# Patient Record
Sex: Male | Born: 1980 | Race: Black or African American | Hispanic: No | Marital: Single | Smoking: Current every day smoker
Health system: Southern US, Community
[De-identification: ages and names within clinical notes are randomized; demographics above are authoritative.]

## PROBLEM LIST (undated history)

## (undated) DIAGNOSIS — I10 Essential (primary) hypertension: Secondary | ICD-10-CM

## (undated) DIAGNOSIS — E669 Obesity, unspecified: Secondary | ICD-10-CM

## (undated) DIAGNOSIS — I82409 Acute embolism and thrombosis of unspecified deep veins of unspecified lower extremity: Secondary | ICD-10-CM

## (undated) DIAGNOSIS — I639 Cerebral infarction, unspecified: Secondary | ICD-10-CM

## (undated) HISTORY — DX: Acute embolism and thrombosis of unspecified deep veins of unspecified lower extremity: I82.409

## (undated) HISTORY — PX: NO PAST SURGERIES: SHX2092

---

## 2004-05-20 ENCOUNTER — Emergency Department: Payer: Self-pay | Admitting: Emergency Medicine

## 2004-05-26 ENCOUNTER — Emergency Department: Payer: Self-pay | Admitting: Emergency Medicine

## 2004-09-08 ENCOUNTER — Emergency Department: Payer: Self-pay | Admitting: Emergency Medicine

## 2008-12-27 ENCOUNTER — Emergency Department: Payer: Self-pay | Admitting: Emergency Medicine

## 2013-07-20 ENCOUNTER — Emergency Department: Payer: Self-pay | Admitting: Emergency Medicine

## 2013-07-20 LAB — CBC
HCT: 48.3 % (ref 40.0–52.0)
HGB: 16.5 g/dL (ref 13.0–18.0)
MCH: 31.2 pg (ref 26.0–34.0)
MCHC: 34.1 g/dL (ref 32.0–36.0)
MCV: 92 fL (ref 80–100)
Platelet: 232 10*3/uL (ref 150–440)
RBC: 5.27 10*6/uL (ref 4.40–5.90)
RDW: 13.1 % (ref 11.5–14.5)
WBC: 6.2 10*3/uL (ref 3.8–10.6)

## 2013-07-20 LAB — COMPREHENSIVE METABOLIC PANEL
ALK PHOS: 78 U/L
ALT: 26 U/L (ref 12–78)
ANION GAP: 1 — AB (ref 7–16)
Albumin: 3.5 g/dL (ref 3.4–5.0)
BUN: 11 mg/dL (ref 7–18)
Bilirubin,Total: 0.6 mg/dL (ref 0.2–1.0)
CREATININE: 1.01 mg/dL (ref 0.60–1.30)
Calcium, Total: 9 mg/dL (ref 8.5–10.1)
Chloride: 105 mmol/L (ref 98–107)
Co2: 28 mmol/L (ref 21–32)
EGFR (Non-African Amer.): >60
Glucose: 108 mg/dL — ABNORMAL HIGH (ref 65–99)
Osmolality: 268 (ref 275–301)
Potassium: 3.7 mmol/L (ref 3.5–5.1)
SGOT(AST): 28 U/L (ref 15–37)
Sodium: 134 mmol/L — ABNORMAL LOW (ref 136–145)
TOTAL PROTEIN: 7.6 g/dL (ref 6.4–8.2)

## 2013-07-20 LAB — TROPONIN I: Troponin-I: <0.02 ng/mL

## 2013-07-20 LAB — CK TOTAL AND CKMB (NOT AT ARMC)
CK, Total: 158 U/L (ref 35–232)
CK-MB: 1.2 ng/mL (ref 0.5–3.6)

## 2013-10-15 ENCOUNTER — Emergency Department: Payer: Self-pay | Admitting: Emergency Medicine

## 2014-07-01 ENCOUNTER — Inpatient Hospital Stay: Payer: Self-pay | Admitting: Internal Medicine

## 2014-07-01 LAB — CBC
HCT: 45.8 % (ref 40.0–52.0)
HGB: 15.3 g/dL (ref 13.0–18.0)
MCH: 31 pg (ref 26.0–34.0)
MCHC: 33.4 g/dL (ref 32.0–36.0)
MCV: 93 fL (ref 80–100)
Platelet: 328 10*3/uL (ref 150–440)
RBC: 4.93 10*6/uL (ref 4.40–5.90)
RDW: 12.8 % (ref 11.5–14.5)
WBC: 7.8 10*3/uL (ref 3.8–10.6)

## 2014-07-01 LAB — TROPONIN I: Troponin-I: 0.02 ng/mL

## 2014-07-01 LAB — BASIC METABOLIC PANEL
ANION GAP: 5 — AB (ref 7–16)
BUN: 9 mg/dL (ref 7–18)
CALCIUM: 9.1 mg/dL (ref 8.5–10.1)
Chloride: 103 mmol/L (ref 98–107)
Co2: 30 mmol/L (ref 21–32)
Creatinine: 1.15 mg/dL (ref 0.60–1.30)
EGFR (African American): >60
EGFR (Non-African Amer.): >60
Glucose: 114 mg/dL — ABNORMAL HIGH (ref 65–99)
Osmolality: 275 (ref 275–301)
Potassium: 3.6 mmol/L (ref 3.5–5.1)
Sodium: 138 mmol/L (ref 136–145)

## 2014-07-02 LAB — CBC WITH DIFFERENTIAL/PLATELET
BASOS ABS: 0.1 10*3/uL (ref 0.0–0.1)
BASOS PCT: 0.8 %
EOS ABS: 0 10*3/uL (ref 0.0–0.7)
Eosinophil %: 0.5 %
HCT: 46.5 % (ref 40.0–52.0)
HGB: 15.3 g/dL (ref 13.0–18.0)
Lymphocyte #: 0.9 10*3/uL — ABNORMAL LOW (ref 1.0–3.6)
Lymphocyte %: 9.9 %
MCH: 30.8 pg (ref 26.0–34.0)
MCHC: 33 g/dL (ref 32.0–36.0)
MCV: 94 fL (ref 80–100)
Monocyte #: 0.1 x10 3/mm — ABNORMAL LOW (ref 0.2–1.0)
Monocyte %: 0.6 %
NEUTROS ABS: 7.6 10*3/uL — AB (ref 1.4–6.5)
Neutrophil %: 88.2 %
Platelet: 343 10*3/uL (ref 150–440)
RBC: 4.97 10*6/uL (ref 4.40–5.90)
RDW: 12.9 % (ref 11.5–14.5)
WBC: 8.6 10*3/uL (ref 3.8–10.6)

## 2014-07-02 LAB — BASIC METABOLIC PANEL
Anion Gap: 7 (ref 7–16)
BUN: 12 mg/dL (ref 7–18)
CO2: 27 mmol/L (ref 21–32)
Calcium, Total: 9.8 mg/dL (ref 8.5–10.1)
Chloride: 103 mmol/L (ref 98–107)
Creatinine: 1.2 mg/dL (ref 0.60–1.30)
EGFR (African American): >60
EGFR (Non-African Amer.): >60
Glucose: 171 mg/dL — ABNORMAL HIGH (ref 65–99)
OSMOLALITY: 278 (ref 275–301)
Potassium: 3.8 mmol/L (ref 3.5–5.1)
Sodium: 137 mmol/L (ref 136–145)

## 2014-07-02 LAB — TROPONIN I
TROPONIN-I: 0.02 ng/mL
TROPONIN-I: 0.06 ng/mL — AB

## 2014-11-04 NOTE — H&P (Signed)
PATIENT NAME:  Ricky Leonard, Ricky Leonard MR#:  [id number] DATE OF BIRTH:  09/02/1980  DATE OF ADMISSION:  07/01/2014  REFERRING PHYSICIAN: Gladstone Pih, MD  PRIMARY CARE PHYSICIAN: None.   CHIEF COMPLAINT: Shortness of breath.   HISTORY OF PRESENT ILLNESS: A 34 year old African American gentleman with past medical history of hypertension as well as seasonal allergies who presented with shortness of breath. He describes symptoms originally starting with URI-like symptoms, cough, congestion, nasal congestion, and rhinorrhea about 2 weeks ago. All those symptoms have essentially resolved; however, now has dyspnea on exertion with associated nonproductive cough and wheezing. Denies any fevers, chills, or further symptomatology. Denies any chest pain, lower extremity edema, or orthopnea. He presented to the hospital for above symptoms, found to be markedly hypertensive.   REVIEW OF SYSTEMS:  CONSTITUTIONAL: Denies fevers, chills, fatigue, weakness.  EYES: Denies blurred vision, double vision, or eye pain.  EARS, NOSE, THROAT: Denies tinnitus, ear pain, or hearing loss.  RESPIRATORY: Positive for nonproductive cough as well as wheezing.  CARDIOVASCULAR: Positive for dyspnea on exertion. Denies chest pain, orthopnea, or edema.  GASTROINTESTINAL: Denies nausea, vomiting, diarrhea, or abdominal pain.  GENITOURINARY: Denies dysuria or hematuria.  ENDOCRINE: Denies nocturia or thyroid problems.   HEMATOLOGY AND LYMPHATIC: Denies easy bruising or bleeding.  SKIN: Denies rash or lesions.  MUSCULOSKELETAL: Denies pain in neck, back, shoulder, knees, hips, or arthritic symptoms.  NEUROLOGIC: Denies paralysis, paresthesias.  PSYCHIATRIC: Denies anxiety or depressive symptoms.   Otherwise, a full review of systems performed by me is negative.  PAST MEDICAL HISTORY: Hypertension; however, on no medications currently, as well as seasonal allergies.   SOCIAL HISTORY: Positive tobacco abuse. Denies for occasional  alcohol. Denies any drug use.   FAMILY HISTORY: Denies any known cardiovascular or pulmonary disorders.   ALLERGIES: No known drug allergies.   HOME MEDICATIONS: None.   PHYSICAL EXAMINATION:  VITAL SIGNS: Temperature 98.2, heart rate of 96, respirations of 24, blood pressure 201/136, saturating 88% on room air, saturating 94% on 2 liters nasal cannula. Weight 129.3 kg. BMI 40.9.  GENERAL: Obese African American gentleman in minimal distress given respiratory status.  HEAD: Normocephalic, atraumatic.  EYES: Pupils equal, round, reactive to light. Extraocular muscles intact. No scleral icterus.  MOUTH: Moist mucosal membrane. Dentition intact. No abscess noted.  EAR, NOSE, THROAT: Clear without exudates. No external lesions.  NECK: Supple. No thyromegaly. No nodules. No JVD.  PULMONARY: Diminished breath sounds in all lung fields with scattered expiratory wheezing most prominent in the left upper lobe. No use of accessory muscles; however, tachypneic.  CHEST: Nontender to palpation.  CARDIOVASCULAR: S1, S2, tachycardic. No murmurs, rubs, or gallops. No edema. Pedal pulses 2+ bilaterally.  GASTROINTESTINAL: Soft, nontender, nondistended. No masses. Positive bowel sounds. No hepatosplenomegaly.  MUSCULOSKELETAL: No swelling, clubbing, or edema. Range of motion full in all extremities.  NEUROLOGIC: Cranial nerves II to XII intact. No gross focal neurological deficits. Sensation intact. Reflexes intact.  SKIN: No ulceration, lesions, rashes, or cyanosis. Skin warm and dry. Turgor intact.  PSYCHIATRIC: Mood and affect within normal limits. The patient is awake, alert, oriented x 3. Insight and judgment intact.   LABORATORY AND RADIOLOGICAL DATA: Sodium 138, potassium 3.6, chloride 103, bicarbonate 30, BUN 9, creatinine 1.15, glucose 114. Troponin 0.02. WBC 7.8, hemoglobin 15.3, platelets of 382,000. Chest x-ray performed which reveals no acute cardiopulmonary process.   ASSESSMENT AND PLAN: A  34 year old African American gentleman with history of hypertension presented with shortness of breath.  1.  Hypertensive urgency:  He stopped taking medications. It is unfortunately unknown of which ones he said he is taking. Regardless, give hydralazine 10 mg intravenous once now, followed by as needed 10 mg intravenous every 4 hours as needed for systolic greater than 180 or diastolic greater than 100. We will also add Norvasc 10 mg by mouth daily. He may require more agents for better control.  2.  Refractive air with wheezing and acute exacerbation: Provide DuoNeb treatments, supplemental oxygen. Keep his oxygen saturation greater than 92%, as well as steroid, Solu-Medrol  60 mg intravenous daily to be tapered off.  3.  Venous thromboembolism prophylaxis: Heparin subcutaneous.   CODE STATUS: The patient is a FULL CODE.   TIME SPENT: 45 minutes.    ____________________________ Cletis Athensavid K. Hower, MD dkh:ts D: 07/01/2014 23:01:00 ET T: 07/01/2014 23:28:01 ET JOB#: [id number]  cc: Cletis Athensavid K. Hower, MD, <Dictator> DAVID Synetta ShadowK HOWER MD ELECTRONICALLY SIGNED 07/02/2014 1:10

## 2014-11-08 NOTE — Discharge Summary (Signed)
PATIENT NAME:  Ricky Leonard, Ricky Leonard MR#:  [id number] DATE OF BIRTH:  August 20, 1980  DATE OF ADMISSION:  07/01/2014 DATE OF DISCHARGE:  07/03/2014  ADMITTING PHYSICIAN: Angelica Ranavid Hower, M.D.   DISCHARGING PHYSICIAN: Enid Baasadhika Lyndsi Altic, MD   PRIMARY CARE PHYSICIAN: None.  CONSULTATIONS IN THE HOSPITAL: None.   DISCHARGE DIAGNOSES:  1.  Hypotensive urgency.  2.  Tachycardia.  3.  Elevated troponin secondary to demand ischemia.  4.  Reactive airway disease.  5.  Tobacco use disorder.   DISCHARGE HOME MEDICATIONS:  1.  Prednisone taper over 12 days.  2.  Benazepril 20 mg p.o. daily.  3.  Clonidine 0.2 mg p.o. b.i.d.  4.  Imdur 30 mg p.o. daily.  5.  Hydralazine 50 mg p.o. t.i.d.  6.  Symbicort 160/4.5 mcg 2 puffs b.i.d.  7.  Combivent Respimat 1 puff 4 times Leonard day.   DISCHARGE DIET: Low-sodium diet.   DISCHARGE ACTIVITY: As tolerated.    FOLLOWUP INSTRUCTIONS:  1.  PCP follow-up in 1 week.  2.  Smoking cessation. 3.  Strongly advised to be compliant with medications.   LABORATORY AND IMAGING STUDIES PRIOR TO DISCHARGE: WBC 8.6, hemoglobin 15.3, hematocrit 46.5, platelet count is 343,000.   Sodium 137, potassium 3.8, chloride 103, bicarbonate 27, BUN 12, creatinine 1.2, glucose 121, and calcium of 9.8. Troponin is 0.06. Chest x-ray revealing no acute cardiopulmonary process, clear lung fields.   BRIEF HOSPITAL COURSE: Ricky Leonard is Leonard 34 year old African American male with no significant past medical history other than hypertension, not taking medications for several months, comes to the hospital secondary to difficulty breathing, tachycardia. He was noted to have hypertensive urgency.  1.  Hypertensive urgency with elevated blood pressure systolic greater than 200 persistently. Requiring multiple IV pushes of hydralazine and labetalol. Finally, the blood pressure was better controlled with oral regimen of clonidine, benazepril, hydralazine, and Imdur at this time.  So, he is being discharged  home on those medications  2.  Sinus tachycardia, secondary to anxiety and also his respiratory issues improved, could not start on any beta blocker because of his wheezing.  3.  Reactive airway disease. Denies any history of asthma or chronic obstructive pulmonary disease, but he is Leonard smoker, significant wheezing, and coughing so was started on prednisone with improvement in the hospital. He is being discharged on the same. He is also being discharged on inhalers for bronchodilation.    His course has been otherwise uneventful in the hospital.   DISCHARGE CONDITION: Stable.   DISCHARGE DISPOSITION: Home.   TIME SPENT ON DISCHARGE: 40 minutes.       ____________________________ Enid Baasadhika Nowell Sites, MD rk:DT D: 07/04/2014 16:24:19 ET T: 07/04/2014 17:07:03 ET JOB#: [id number]  cc: Enid Baasadhika Starnisha Batrez, MD, <Dictator> [facility] Enid BaasADHIKA Jules Baty MD ELECTRONICALLY SIGNED 07/18/2014 16:52

## 2016-06-13 ENCOUNTER — Emergency Department: Payer: Medicaid Other

## 2016-06-13 ENCOUNTER — Encounter: Payer: Self-pay | Admitting: Emergency Medicine

## 2016-06-13 ENCOUNTER — Inpatient Hospital Stay
Admission: EM | Admit: 2016-06-13 | Discharge: 2016-06-16 | DRG: 304 | Disposition: A | Discharge status: Home / Self Care | Payer: Medicaid Other | Attending: Internal Medicine | Admitting: Internal Medicine

## 2016-06-13 DIAGNOSIS — I161 Hypertensive emergency: Secondary | ICD-10-CM | POA: Diagnosis present

## 2016-06-13 DIAGNOSIS — J9601 Acute respiratory failure with hypoxia: Secondary | ICD-10-CM | POA: Diagnosis present

## 2016-06-13 DIAGNOSIS — R079 Chest pain, unspecified: Secondary | ICD-10-CM | POA: Diagnosis present

## 2016-06-13 DIAGNOSIS — R0902 Hypoxemia: Secondary | ICD-10-CM

## 2016-06-13 DIAGNOSIS — E876 Hypokalemia: Secondary | ICD-10-CM | POA: Diagnosis present

## 2016-06-13 DIAGNOSIS — I82C12 Acute embolism and thrombosis of left internal jugular vein: Secondary | ICD-10-CM | POA: Diagnosis present

## 2016-06-13 DIAGNOSIS — Z818 Family history of other mental and behavioral disorders: Secondary | ICD-10-CM | POA: Diagnosis not present

## 2016-06-13 DIAGNOSIS — F17210 Nicotine dependence, cigarettes, uncomplicated: Secondary | ICD-10-CM | POA: Diagnosis present

## 2016-06-13 DIAGNOSIS — I10 Essential (primary) hypertension: Secondary | ICD-10-CM | POA: Diagnosis not present

## 2016-06-13 DIAGNOSIS — R609 Edema, unspecified: Secondary | ICD-10-CM

## 2016-06-13 DIAGNOSIS — E669 Obesity, unspecified: Secondary | ICD-10-CM | POA: Diagnosis present

## 2016-06-13 DIAGNOSIS — E785 Hyperlipidemia, unspecified: Secondary | ICD-10-CM | POA: Diagnosis present

## 2016-06-13 DIAGNOSIS — Z8249 Family history of ischemic heart disease and other diseases of the circulatory system: Secondary | ICD-10-CM | POA: Diagnosis not present

## 2016-06-13 DIAGNOSIS — Z6835 Body mass index (BMI) 35.0-35.9, adult: Secondary | ICD-10-CM | POA: Diagnosis not present

## 2016-06-13 DIAGNOSIS — M7989 Other specified soft tissue disorders: Secondary | ICD-10-CM | POA: Diagnosis present

## 2016-06-13 DIAGNOSIS — I82B12 Acute embolism and thrombosis of left subclavian vein: Secondary | ICD-10-CM | POA: Diagnosis not present

## 2016-06-13 DIAGNOSIS — I248 Other forms of acute ischemic heart disease: Secondary | ICD-10-CM | POA: Diagnosis present

## 2016-06-13 DIAGNOSIS — Z825 Family history of asthma and other chronic lower respiratory diseases: Secondary | ICD-10-CM

## 2016-06-13 HISTORY — DX: Essential (primary) hypertension: I10

## 2016-06-13 LAB — BASIC METABOLIC PANEL
ANION GAP: 7 (ref 5–15)
BUN: 18 mg/dL (ref 6–20)
CO2: 26 mmol/L (ref 22–32)
Calcium: 9.6 mg/dL (ref 8.9–10.3)
Chloride: 104 mmol/L (ref 101–111)
Creatinine, Ser: 1.27 mg/dL — ABNORMAL HIGH (ref 0.61–1.24)
GFR calc Af Amer: >60 mL/min (ref >60)
GFR calc non Af Amer: >60 mL/min (ref >60)
GLUCOSE: 83 mg/dL (ref 65–99)
POTASSIUM: 3.5 mmol/L (ref 3.5–5.1)
Sodium: 137 mmol/L (ref 135–145)

## 2016-06-13 LAB — CBC
HEMATOCRIT: 44.3 % (ref 40.0–52.0)
HEMOGLOBIN: 15.2 g/dL (ref 13.0–18.0)
MCH: 32.1 pg (ref 26.0–34.0)
MCHC: 34.3 g/dL (ref 32.0–36.0)
MCV: 93.7 fL (ref 80.0–100.0)
Platelets: 197 10*3/uL (ref 150–440)
RBC: 4.73 MIL/uL (ref 4.40–5.90)
RDW: 13.4 % (ref 11.5–14.5)
WBC: 8.5 10*3/uL (ref 3.8–10.6)

## 2016-06-13 LAB — TROPONIN I: Troponin I: <0.03 ng/mL (ref <0.03)

## 2016-06-13 MED ORDER — NICARDIPINE HCL IN NACL 20-0.86 MG/200ML-% IV SOLN
3.0000 mg/h | INTRAVENOUS | Status: DC
  Administered 2016-06-14: 7.5 mg/h via INTRAVENOUS
  Administered 2016-06-14 (×2): 5 mg/h via INTRAVENOUS
  Administered 2016-06-14: 10 mg/h via INTRAVENOUS
  Administered 2016-06-14: 7.5 mg/h via INTRAVENOUS
  Administered 2016-06-14: 5 mg/h via INTRAVENOUS
  Administered 2016-06-14 (×2): 7.5 mg/h via INTRAVENOUS
  Administered 2016-06-14: 5 mg/h via INTRAVENOUS
  Administered 2016-06-15: 3 mg/h via INTRAVENOUS
  Administered 2016-06-15: 7.5 mg/h via INTRAVENOUS
  Administered 2016-06-15 (×3): 5.5 mg/h via INTRAVENOUS
  Filled 2016-06-13 (×15): qty 200

## 2016-06-13 MED ORDER — LABETALOL HCL 5 MG/ML IV SOLN
40.0000 mg | Freq: Once | INTRAVENOUS | Status: AC
  Administered 2016-06-13: 40 mg via INTRAVENOUS

## 2016-06-13 MED ORDER — LABETALOL HCL 5 MG/ML IV SOLN
INTRAVENOUS | Status: AC
Start: 2016-06-13 — End: 2016-06-14
  Filled 2016-06-13: qty 8

## 2016-06-13 MED ORDER — HYDRALAZINE HCL 20 MG/ML IJ SOLN
INTRAMUSCULAR | Status: AC
  Filled 2016-06-13: qty 1

## 2016-06-13 MED ORDER — HYDRALAZINE HCL 20 MG/ML IJ SOLN
10.0000 mg | Freq: Once | INTRAMUSCULAR | Status: AC
  Administered 2016-06-13: 10 mg via INTRAVENOUS

## 2016-06-13 MED ORDER — NICARDIPINE HCL IN NACL 20-0.86 MG/200ML-% IV SOLN
INTRAVENOUS | Status: AC
  Filled 2016-06-13: qty 200

## 2016-06-13 MED ORDER — IOPAMIDOL (ISOVUE-370) INJECTION 76%
100.0000 mL | Freq: Once | INTRAVENOUS | Status: AC | PRN
  Administered 2016-06-13: 100 mL via INTRAVENOUS

## 2016-06-13 MED ORDER — LABETALOL HCL 5 MG/ML IV SOLN
20.0000 mg | Freq: Once | INTRAVENOUS | Status: AC
  Administered 2016-06-13: 20 mg via INTRAVENOUS
  Filled 2016-06-13: qty 4

## 2016-06-13 NOTE — H&P (Signed)
[facility] [place] [facility]   PATIENT NAME: Ricky FretShaun Leonard    MR#:  [id number]  DATE OF BIRTH:  1980/11/24  DATE OF ADMISSION:  06/13/2016  PRIMARY CARE PHYSICIAN: No PCP Per Patient   REQUESTING/REFERRING PHYSICIAN: Huel CoteQuigley, MD  CHIEF COMPLAINT:   Chief Complaint  Patient presents with  . Chest Pain    HISTORY OF PRESENT ILLNESS:  Ricky Leonard  is a 35 y.o. male who presents with Chest discomfort and left arm swelling. Patient has a history of hypertension for which she has not taken any medication or solid any treatment. Tonight his blood pressure was in the 200s systolic in the ED. Workup was negative for cardiac source for his chest pain with negative pulmonary and a CTA chest that didn't show any significant abnormality except for some possible left upper extremity thrombosis. Patient states that he developed left upper extremity swelling just in the last 24 hours. He denies any recent IV placement or IV drug use. Multiple rounds of antihypertensives did not adequately control his blood pressure and hospitalists were called for admission and further treatment  PAST MEDICAL HISTORY:   Past Medical History:  Diagnosis Date  . Hypertension     PAST SURGICAL HISTORY:   Past Surgical History:  Procedure Laterality Date  . NO PAST SURGERIES      SOCIAL HISTORY:   Social History  Substance Use Topics  . Smoking status: Current Every Day Smoker    Packs/day: 0.50    Types: Cigarettes  . Smokeless tobacco: Never Used  . Alcohol use Yes    FAMILY HISTORY:   Family History  Problem Relation Age of Onset  . Hypertension Other     DRUG ALLERGIES:  No Known Allergies  MEDICATIONS AT HOME:   Prior to Admission medications   Not on File    REVIEW OF SYSTEMS:  Review of Systems  Constitutional: Negative for chills, fever, malaise/fatigue and weight loss.  HENT: Negative for ear pain, hearing loss and tinnitus.   Eyes: Negative for  blurred vision, double vision, pain and redness.  Respiratory: Negative for cough, hemoptysis and shortness of breath.   Cardiovascular: Positive for chest pain. Negative for palpitations, orthopnea and leg swelling.       Left upper extremity swelling  Gastrointestinal: Negative for abdominal pain, constipation, diarrhea, nausea and vomiting.  Genitourinary: Negative for dysuria, frequency and hematuria.  Musculoskeletal: Negative for back pain, joint pain and neck pain.  Skin:       No acne, rash, or lesions  Neurological: Negative for dizziness, tremors, focal weakness and weakness.  Endo/Heme/Allergies: Negative for polydipsia. Does not bruise/bleed easily.  Psychiatric/Behavioral: Negative for depression. The patient is not nervous/anxious and does not have insomnia.      VITAL SIGNS:   Vitals:   06/13/16 1845 06/13/16 1847 06/13/16 2100 06/13/16 2145  BP:  (!) 227/143 (!) 211/130 (!) 212/130  Pulse:  89 73   Resp:  18 (!) 22 15  Temp:  98.4 F (36.9 C)    TempSrc:  Oral    SpO2:  96% 97%   Weight: 113.4 kg (250 lb)     Height: 5\' 10"  (1.778 m)      Wt Readings from Last 3 Encounters:  06/13/16 113.4 kg (250 lb)    PHYSICAL EXAMINATION:  Physical Exam  Vitals reviewed. Constitutional: He is oriented to person, place, and time. He appears well-developed and well-nourished. No distress.  HENT:  Head: Normocephalic and atraumatic.  Mouth/Throat: Oropharynx is clear and moist.  Eyes: Conjunctivae and EOM are normal. Pupils are equal, round, and reactive to light. No scleral icterus.  Neck: Normal range of motion. Neck supple. No JVD present. No thyromegaly present.  Cardiovascular: Normal rate, regular rhythm and intact distal pulses.  Exam reveals no gallop and no friction rub.   No murmur heard. Respiratory: Effort normal and breath sounds normal. No respiratory distress. He has no wheezes. He has no rales.  GI: Soft. Bowel sounds are normal. He exhibits no distension.  There is no tenderness.  Musculoskeletal: Normal range of motion. He exhibits edema (left upper extremity).  No arthritis, no gout  Lymphadenopathy:    He has no cervical adenopathy.  Neurological: He is alert and oriented to person, place, and time. No cranial nerve deficit.  No dysarthria, no aphasia  Skin: Skin is warm and dry. No rash noted. No erythema.  Psychiatric: He has a normal mood and affect. His behavior is normal. Judgment and thought content normal.    LABORATORY PANEL:   CBC  Recent Labs Lab 06/13/16 1851  WBC 8.5  HGB 15.2  HCT 44.3  PLT 197   ------------------------------------------------------------------------------------------------------------------  Chemistries   Recent Labs Lab 06/13/16 1851  NA 137  K 3.5  CL 104  CO2 26  GLUCOSE 83  BUN 18  CREATININE 1.27*  CALCIUM 9.6   ------------------------------------------------------------------------------------------------------------------  Cardiac Enzymes  Recent Labs Lab 06/13/16 1851  TROPONINI <0.03   ------------------------------------------------------------------------------------------------------------------  RADIOLOGY:  Dg Chest 2 View  Result Date: 06/13/2016 CLINICAL DATA:  Central chest pain radiating to the jaw for 2-3 days EXAM: CHEST  2 VIEW COMPARISON:  07/01/2014 CXR FINDINGS: The heart size and mediastinal contours are within normal limits. Both lungs are clear. The visualized skeletal structures are without acute abnormality. IMPRESSION: No active cardiopulmonary disease. Electronically Signed   By: Tollie Ethavid  Kwon M.D.   On: 06/13/2016 19:28   Ct Angio Chest Aorta W And/or Wo Contrast  Result Date: 06/13/2016 CLINICAL DATA:  Chest pain radiating to jaw for 3 days. Shortness of breath, dizziness, and lightheadedness. Clinical suspicion for thoracic aortic dissection. EXAM: CT ANGIOGRAPHY CHEST WITH CONTRAST TECHNIQUE: Multidetector CT imaging of the chest was performed  using the standard protocol during bolus administration of intravenous contrast. Multiplanar CT image reconstructions and MIPs were obtained to evaluate the vascular anatomy. CONTRAST:  100 mL Isovue 370 COMPARISON:  None. FINDINGS: Cardiovascular: No evidence of thoracic aortic aneurysm or dissection. No evidence of mediastinal hematoma. No central pulmonary emboli identified. Normal heart size. No evidence of pericardial effusion. Mediastinum/Nodes: No soft tissue masses, lymphadenopathy, or mediastinal hematoma identified. Hazy density is seen in anterior mediastinum surrounding the left innominate vein, and tracking peripherally along the subclavian and axillary veins. This is nonspecific but may be due to upper extremity DVT/thrombophlebitis. Lungs/Pleura: No pulmonary mass, infiltrate, or effusion. Upper abdomen: No acute findings. Several tiny calcified gallstones noted, without evidence of cholecystitis. Musculoskeletal: No suspicious bone lesions or other significant abnormality identified. IMPRESSION: No evidence of thoracic aortic aneurysm or dissection. Hazy density in anterior mediastinum surrounding left innominate vein, and tracking peripherally along the left subclavian and axillary veins. This is nonspecific, but may be due to upper extremity DVT/thrombophlebitis. Recommend upper extremity venous Doppler ultrasound for further evaluation. No active lung disease. Cholelithiasis.  No radiographic evidence of cholecystitis. Electronically Signed   By: Myles RosenthalJohn  Stahl M.D.   On: 06/13/2016 22:16    EKG:   Orders placed or performed during the  hospital encounter of 06/13/16  . EKG 12-Lead  . EKG 12-Lead  . ED EKG within 10 minutes  . ED EKG within 10 minutes    IMPRESSION AND PLAN:  Principal Problem:   Accelerated hypertension - antihypertensives in the ED were not very successful. Nicardipine drip started and patient admitted to stepdown unit. Blood pressure goal for the first 12 hours will  be to get him systolic less than 180 and diastolic less than 100. After that the goal be to reduce his blood pressure to less than 160/100. Active Problems:   Chest pain - improved with some reduction in his blood pressure. CTA chest showed no significant abnormalities except for possible thrombosis in his left upper extremity vessels, see below.   Left arm swelling - possibly due to thrombosis, left upper extremity ultrasound ordered  All the records are reviewed and case discussed with ED provider. Management plans discussed with the patient and/or family.  DVT PROPHYLAXIS: SubQ lovenox  GI PROPHYLAXIS: None  ADMISSION STATUS: Inpatient  CODE STATUS: Full Code Status History    This patient does not have a recorded code status. Please follow your organizational policy for patients in this situation.      TOTAL TIME TAKING CARE OF THIS PATIENT: 45 minutes.    Aradhana Gin FIELDING 06/13/2016, 11:51 PM  [facility]  Office  [phone]  CC: Primary care physician; No PCP Per Patient

## 2016-06-13 NOTE — ED Provider Notes (Signed)
Time Seen: Approximately 2101  I have reviewed the triage notes  Chief Complaint: Chest Pain   History of Present Illness: Ricky Leonard is a 35 y.o. male *who presents with some substernal chest discomfort radiating up into the jaw over the last couple of days. Patient's had a history of hypertensive urgency with previous admission here 2 years ago. The patient states he's also noticed some swelling in his left upper extremity. He came in tonight because he's been living with somebody that's currently being treated for tuberculosis over the past year. He denies any productive cough or fever. Patient denies any illicit drug usage  Past Medical History:  Diagnosis Date  . Hypertension     There are no active problems to display for this patient.   History reviewed. No pertinent surgical history.  History reviewed. No pertinent surgical history.    Allergies:  Patient has no known allergies.  Family History: No family history on file.  Social History: Social History  Substance Use Topics  . Smoking status: Current Every Day Smoker    Packs/day: 0.50    Types: Cigarettes  . Smokeless tobacco: Never Used  . Alcohol use Yes     Review of Systems:   10 point review of systems was performed and was otherwise negative:  Constitutional: No fever Eyes: No visual disturbances ENT: No sore throat, ear pain Cardiac: No chest pain Respiratory: No shortness of breath, wheezing, or stridor Abdomen: No abdominal pain, no vomiting, No diarrhea Endocrine: No weight loss, No night sweats Extremities: No peripheral edema, cyanosis Skin: No rashes, easy bruising Neurologic: No focal weakness, trouble with speech or swollowing Urologic: No dysuria, Hematuria, or urinary frequency  Physical Exam:  ED Triage Vitals  Enc Vitals Group     BP 06/13/16 1847 (!) 227/143     Pulse Rate 06/13/16 1847 89     Resp 06/13/16 1847 18     Temp 06/13/16 1847 98.4 F (36.9 C)     Temp  Source 06/13/16 1847 Oral     SpO2 06/13/16 1847 96 %     Weight 06/13/16 1845 250 lb (113.4 kg)     Height 06/13/16 1845 5\' 10"  (1.778 m)     Head Circumference --      Peak Flow --      Pain Score 06/13/16 1845 7     Pain Loc --      Pain Edu? --      Excl. in GC? --     General: Awake , Alert , and Oriented times 3; GCS 15 Head: Normal cephalic , atraumatic Eyes: Pupils equal , round, reactive to light Nose/Throat: No nasal drainage, patent upper airway without erythema or exudate.  Neck: Supple, Full range of motion, No anterior adenopathy or palpable thyroid masses Lungs: Clear to ascultation without wheezes , rhonchi, or rales Heart: Regular rate, regular rhythm without murmurs , gallops , or rubs Abdomen: Soft, non tender without rebound, guarding , or rigidity; bowel sounds positive and symmetric in all 4 quadrants. No organomegaly .        Extremities: Mild swelling in the left upper extremity Neurologic: normal ambulation, Motor symmetric without deficits, sensory intact Skin: warm, dry, no rashes   Labs:   All laboratory work was reviewed including any pertinent negatives or positives listed below:  Labs Reviewed  BASIC METABOLIC PANEL - Abnormal; Notable for the following:       Result Value   Creatinine, Ser 1.27 (*)  All other components within normal limits  CBC  TROPONIN I  URINE DRUG SCREEN, QUALITATIVE ([facility] ONLY)  Patient has mild renal insufficiency  EKG: * ED ECG REPORT I, Jennye MoccasinBrian S Kellyjo Edgren, the attending physician, personally viewed and interpreted this ECG.  Date: 06/13/2016 EKG Time: 1847 Rate: 80 Rhythm: normal sinus rhythm QRS Axis: normal Intervals: normal ST/T Wave abnormalities: normal Conduction Disturbances: none Narrative Interpretation: unremarkable Left ventricular hypertrophy   Radiology: * "Dg Chest 2 View  Result Date: 06/13/2016 CLINICAL DATA:  Central chest pain radiating to the jaw for 2-3 days EXAM: CHEST  2 VIEW  COMPARISON:  07/01/2014 CXR FINDINGS: The heart size and mediastinal contours are within normal limits. Both lungs are clear. The visualized skeletal structures are without acute abnormality. IMPRESSION: No active cardiopulmonary disease. Electronically Signed   By: Tollie Ethavid  Kwon M.D.   On: 06/13/2016 19:28   Ct Angio Chest Aorta W And/or Wo Contrast  Result Date: 06/13/2016 CLINICAL DATA:  Chest pain radiating to jaw for 3 days. Shortness of breath, dizziness, and lightheadedness. Clinical suspicion for thoracic aortic dissection. EXAM: CT ANGIOGRAPHY CHEST WITH CONTRAST TECHNIQUE: Multidetector CT imaging of the chest was performed using the standard protocol during bolus administration of intravenous contrast. Multiplanar CT image reconstructions and MIPs were obtained to evaluate the vascular anatomy. CONTRAST:  100 mL Isovue 370 COMPARISON:  None. FINDINGS: Cardiovascular: No evidence of thoracic aortic aneurysm or dissection. No evidence of mediastinal hematoma. No central pulmonary emboli identified. Normal heart size. No evidence of pericardial effusion. Mediastinum/Nodes: No soft tissue masses, lymphadenopathy, or mediastinal hematoma identified. Hazy density is seen in anterior mediastinum surrounding the left innominate vein, and tracking peripherally along the subclavian and axillary veins. This is nonspecific but may be due to upper extremity DVT/thrombophlebitis. Lungs/Pleura: No pulmonary mass, infiltrate, or effusion. Upper abdomen: No acute findings. Several tiny calcified gallstones noted, without evidence of cholecystitis. Musculoskeletal: No suspicious bone lesions or other significant abnormality identified. IMPRESSION: No evidence of thoracic aortic aneurysm or dissection. Hazy density in anterior mediastinum surrounding left innominate vein, and tracking peripherally along the left subclavian and axillary veins. This is nonspecific, but may be due to upper extremity DVT/thrombophlebitis.  Recommend upper extremity venous Doppler ultrasound for further evaluation. No active lung disease. Cholelithiasis.  No radiographic evidence of cholecystitis. Electronically Signed   By: Myles RosenthalJohn  Stahl M.D.   On: 06/13/2016 22:16  "  I personally reviewed the radiologic studies    Critical Care: * CRITICAL CARE Performed by: Jennye MoccasinBrian S Damain Broadus   Total critical care time: 33 minutes  Critical care time was exclusive of separately billable procedures and treating other patients.  Critical care was necessary to treat or prevent imminent or life-threatening deterioration.  Critical care was time spent personally by me on the following activities: development of treatment plan with patient and/or surrogate as well as nursing, discussions with consultants, evaluation of patient's response to treatment, examination of patient, obtaining history from patient or surrogate, ordering and performing treatments and interventions, ordering and review of laboratory studies, ordering and review of radiographic studies, pulse oximetry and re-evaluation of patient's condition. IV blood pressure medication for hypertensive emergency    ED Course: * Patient's blood pressure was decreased slightly as chest pain is also decreased. He had some findings on physical exam and also per CAT scan evaluation of concern of a clot in his left upper extremity. Her study is currently ordered. The patient does not have any evidence of a aortic dissection at this  time. Clinical Course      Assessment: Hypertensive emergency  Final Clinical Impression:   Final diagnoses:  Hypertensive emergency without congestive heart failure     Plan:  Inpatient management            Jennye MoccasinBrian S Tharon Kitch, MD 06/13/16 2252

## 2016-06-13 NOTE — ED Triage Notes (Addendum)
Pt reports central chest pain radiating to jaw for 2-3 days. Pt reports SOB, dizziness and lightheadedness off and on. Pt also reports back pain. Pt in no apparent distress in triage. Pt reports that he is living with someone that has been treated for tuberculosis in the past year. Pt denies cough or night sweats. Denies fever.

## 2016-06-14 ENCOUNTER — Inpatient Hospital Stay: Payer: Medicaid Other

## 2016-06-14 ENCOUNTER — Encounter: Payer: Self-pay | Admitting: Radiology

## 2016-06-14 DIAGNOSIS — I82B12 Acute embolism and thrombosis of left subclavian vein: Secondary | ICD-10-CM

## 2016-06-14 DIAGNOSIS — F17210 Nicotine dependence, cigarettes, uncomplicated: Secondary | ICD-10-CM

## 2016-06-14 DIAGNOSIS — I10 Essential (primary) hypertension: Secondary | ICD-10-CM

## 2016-06-14 DIAGNOSIS — I82C12 Acute embolism and thrombosis of left internal jugular vein: Secondary | ICD-10-CM

## 2016-06-14 DIAGNOSIS — R079 Chest pain, unspecified: Secondary | ICD-10-CM

## 2016-06-14 DIAGNOSIS — R634 Abnormal weight loss: Secondary | ICD-10-CM

## 2016-06-14 DIAGNOSIS — I82B19 Acute embolism and thrombosis of unspecified subclavian vein: Secondary | ICD-10-CM

## 2016-06-14 LAB — URINE DRUG SCREEN, QUALITATIVE (ARMC ONLY)
Amphetamines, Ur Screen: NOT DETECTED
Barbiturates, Ur Screen: NOT DETECTED
Benzodiazepine, Ur Scrn: NOT DETECTED
CANNABINOID 50 NG, UR ~~LOC~~: POSITIVE — AB
Cocaine Metabolite,Ur ~~LOC~~: NOT DETECTED
MDMA (ECSTASY) UR SCREEN: NOT DETECTED
Methadone Scn, Ur: NOT DETECTED
OPIATE, UR SCREEN: NOT DETECTED
Phencyclidine (PCP) Ur S: NOT DETECTED
Tricyclic, Ur Screen: NOT DETECTED

## 2016-06-14 LAB — PROTIME-INR
INR: 1.03
Prothrombin Time: 13.5 seconds (ref 11.4–15.2)

## 2016-06-14 LAB — BASIC METABOLIC PANEL
Anion gap: 8 (ref 5–15)
BUN: 13 mg/dL (ref 6–20)
CHLORIDE: 103 mmol/L (ref 101–111)
CO2: 26 mmol/L (ref 22–32)
Calcium: 9.4 mg/dL (ref 8.9–10.3)
Creatinine, Ser: 1.14 mg/dL (ref 0.61–1.24)
GFR calc Af Amer: >60 mL/min (ref >60)
GFR calc non Af Amer: >60 mL/min (ref >60)
Glucose, Bld: 115 mg/dL — ABNORMAL HIGH (ref 65–99)
POTASSIUM: 3.4 mmol/L — AB (ref 3.5–5.1)
SODIUM: 137 mmol/L (ref 135–145)

## 2016-06-14 LAB — CBC
HEMATOCRIT: 42.4 % (ref 40.0–52.0)
Hemoglobin: 15 g/dL (ref 13.0–18.0)
MCH: 32.4 pg (ref 26.0–34.0)
MCHC: 35.4 g/dL (ref 32.0–36.0)
MCV: 91.6 fL (ref 80.0–100.0)
Platelets: 199 10*3/uL (ref 150–440)
RBC: 4.63 MIL/uL (ref 4.40–5.90)
RDW: 13.4 % (ref 11.5–14.5)
WBC: 8.6 10*3/uL (ref 3.8–10.6)

## 2016-06-14 LAB — HEPARIN LEVEL (UNFRACTIONATED): HEPARIN UNFRACTIONATED: 1.38 [IU]/mL — AB (ref 0.30–0.70)

## 2016-06-14 LAB — MRSA PCR SCREENING: MRSA by PCR: NEGATIVE

## 2016-06-14 LAB — APTT: aPTT: 38 seconds — ABNORMAL HIGH (ref 24–36)

## 2016-06-14 MED ORDER — NICOTINE 21 MG/24HR TD PT24
21.0000 mg | MEDICATED_PATCH | Freq: Every day | TRANSDERMAL | Status: DC
  Administered 2016-06-14 – 2016-06-16 (×3): 21 mg via TRANSDERMAL
  Filled 2016-06-14 (×3): qty 1

## 2016-06-14 MED ORDER — ACETAMINOPHEN 325 MG PO TABS: 650.0000 mg | ORAL_TABLET | Freq: Four times a day (QID) | ORAL | Status: DC | PRN

## 2016-06-14 MED ORDER — HYDROCHLOROTHIAZIDE 12.5 MG PO CAPS
12.5000 mg | ORAL_CAPSULE | Freq: Every day | ORAL | Status: DC
  Administered 2016-06-14: 12.5 mg via ORAL
  Filled 2016-06-14: qty 1

## 2016-06-14 MED ORDER — ENOXAPARIN SODIUM 120 MG/0.8ML ~~LOC~~ SOLN
1.0000 mg/kg | Freq: Two times a day (BID) | SUBCUTANEOUS | Status: DC
  Administered 2016-06-14: 115 mg via SUBCUTANEOUS
  Filled 2016-06-14: qty 0.8

## 2016-06-14 MED ORDER — IOPAMIDOL (ISOVUE-370) INJECTION 76%
75.0000 mL | Freq: Once | INTRAVENOUS | Status: AC | PRN
  Administered 2016-06-14: 75 mL via INTRAVENOUS

## 2016-06-14 MED ORDER — POTASSIUM CHLORIDE CRYS ER 20 MEQ PO TBCR
40.0000 meq | EXTENDED_RELEASE_TABLET | Freq: Once | ORAL | Status: AC
  Administered 2016-06-14: 40 meq via ORAL
  Filled 2016-06-14: qty 2

## 2016-06-14 MED ORDER — SODIUM CHLORIDE 0.9% FLUSH
3.0000 mL | Freq: Two times a day (BID) | INTRAVENOUS | Status: DC
  Administered 2016-06-14 – 2016-06-15 (×3): 3 mL via INTRAVENOUS

## 2016-06-14 MED ORDER — HYDROCODONE-ACETAMINOPHEN 5-325 MG PO TABS
1.0000 | ORAL_TABLET | ORAL | Status: DC | PRN
  Administered 2016-06-14: 1 via ORAL
  Administered 2016-06-14: 2 via ORAL
  Administered 2016-06-15 (×2): 1 via ORAL
  Filled 2016-06-14 (×2): qty 1
  Filled 2016-06-14: qty 2
  Filled 2016-06-14: qty 1

## 2016-06-14 MED ORDER — METOPROLOL TARTRATE 50 MG PO TABS
50.0000 mg | ORAL_TABLET | Freq: Two times a day (BID) | ORAL | Status: DC
  Administered 2016-06-14 – 2016-06-15 (×2): 50 mg via ORAL
  Filled 2016-06-14 (×2): qty 1

## 2016-06-14 MED ORDER — ONDANSETRON HCL 4 MG PO TABS: 4.0000 mg | ORAL_TABLET | Freq: Four times a day (QID) | ORAL | Status: DC | PRN

## 2016-06-14 MED ORDER — METOPROLOL TARTRATE 25 MG PO TABS
25.0000 mg | ORAL_TABLET | Freq: Two times a day (BID) | ORAL | Status: DC
  Administered 2016-06-14: 25 mg via ORAL
  Filled 2016-06-14: qty 1

## 2016-06-14 MED ORDER — ENOXAPARIN SODIUM 40 MG/0.4ML ~~LOC~~ SOLN: 40.0000 mg | SUBCUTANEOUS | Status: DC

## 2016-06-14 MED ORDER — ACETAMINOPHEN 650 MG RE SUPP: 650.0000 mg | Freq: Four times a day (QID) | RECTAL | Status: DC | PRN

## 2016-06-14 MED ORDER — HEPARIN (PORCINE) IN NACL 100-0.45 UNIT/ML-% IJ SOLN
1650.0000 [IU]/h | INTRAMUSCULAR | Status: DC
  Administered 2016-06-14 (×2): 1650 [IU]/h via INTRAVENOUS
  Filled 2016-06-14 (×2): qty 250

## 2016-06-14 MED ORDER — ONDANSETRON HCL 4 MG/2ML IJ SOLN: 4.0000 mg | Freq: Four times a day (QID) | INTRAMUSCULAR | Status: DC | PRN

## 2016-06-14 MED ORDER — HEPARIN BOLUS VIA INFUSION
5000.0000 [IU] | Freq: Once | INTRAVENOUS | Status: AC
  Administered 2016-06-14: 5000 [IU] via INTRAVENOUS
  Filled 2016-06-14: qty 5000

## 2016-06-14 NOTE — Progress Notes (Addendum)
[employer] - [place] [facility]   PATIENT NAME: Ricky Leonard    MR#:  [id number]  DATE OF BIRTH:  Jun 18, 1981  SUBJECTIVE:  CHIEF COMPLAINT:   Chief Complaint  Patient presents with  . Chest Pain   No chest pain. But blood pressure is still elevated at 180s/100s. On Cardizem drip REVIEW OF SYSTEMS:  Review of Systems  Constitutional: Negative for chills, fever and malaise/fatigue.  HENT: Negative for congestion.   Eyes: Negative for blurred vision and double vision.  Respiratory: Negative for cough, shortness of breath and stridor.   Cardiovascular: Negative for chest pain and leg swelling.  Gastrointestinal: Negative for abdominal pain, blood in stool, diarrhea, melena, nausea and vomiting.  Genitourinary: Negative for dysuria and hematuria.  Musculoskeletal: Negative for joint pain.  Skin: Negative for rash.  Neurological: Negative for dizziness, tingling, focal weakness, loss of consciousness and headaches.  Psychiatric/Behavioral: Negative for depression. The patient is not nervous/anxious.     DRUG ALLERGIES:  No Known Allergies VITALS:  Blood pressure (!) 186/106, pulse 98, temperature 99.3 F (37.4 C), temperature source Oral, resp. rate (!) 25, height 5\' 10"  (1.778 m), weight 250 lb (113.4 kg), SpO2 94 %. PHYSICAL EXAMINATION:  Physical Exam  Constitutional: He is oriented to person, place, and time and well-developed, well-nourished, and in no distress.  HENT:  Head: Normocephalic.  Mouth/Throat: Oropharynx is clear and moist.  Eyes: Conjunctivae and EOM are normal. Pupils are equal, round, and reactive to light. No scleral icterus.  Neck: Normal range of motion. Neck supple. No JVD present. No tracheal deviation present.  Cardiovascular: Normal rate, regular rhythm and normal heart sounds.  Exam reveals no gallop.   No murmur heard. Pulmonary/Chest: Effort normal and breath sounds normal. No respiratory distress. He has no wheezes. He has  no rales.  Abdominal: He exhibits no distension. There is no tenderness.  Musculoskeletal: Normal range of motion. He exhibits no edema or tenderness.  Neurological: He is alert and oriented to person, place, and time. No cranial nerve deficit.  Skin: No rash noted. No erythema.  Psychiatric: Affect and judgment normal.   LABORATORY PANEL:   CBC  Recent Labs Lab 06/14/16 0515  WBC 8.6  HGB 15.0  HCT 42.4  PLT 199   ------------------------------------------------------------------------------------------------------------------ Chemistries   Recent Labs Lab 06/14/16 0515  NA 137  K 3.4*  CL 103  CO2 26  GLUCOSE 115*  BUN 13  CREATININE 1.14  CALCIUM 9.4   RADIOLOGY:  Dg Chest 2 View  Result Date: 06/13/2016 CLINICAL DATA:  Central chest pain radiating to the jaw for 2-3 days EXAM: CHEST  2 VIEW COMPARISON:  07/01/2014 CXR FINDINGS: The heart size and mediastinal contours are within normal limits. Both lungs are clear. The visualized skeletal structures are without acute abnormality. IMPRESSION: No active cardiopulmonary disease. Electronically Signed   By: Tollie Eth M.D.   On: 06/13/2016 19:28   US Venous Img Upper Uni Left  Result Date: 06/14/2016 CLINICAL DATA:  Upper extremity swelling EXAM: Left UPPER EXTREMITY VENOUS DOPPLER ULTRASOUND TECHNIQUE: Gray-scale sonography with graded compression, as well as color Doppler and duplex ultrasound were performed to evaluate the upper extremity deep venous system from the level of the subclavian vein and including the jugular, axillary, basilic, radial, ulnar and upper cephalic vein. Spectral Doppler was utilized to evaluate flow at rest and with distal augmentation maneuvers. COMPARISON:  CT 06/13/2016 FINDINGS: Contralateral Subclavian Vein: Respiratory phasicity is normal and symmetric with the symptomatic  side. No evidence of thrombus. Normal compressibility. Internal Jugular Vein: Expanded with echogenic occlusive thrombus.  Subclavian Vein: Not compressible. Nonocclusive thrombus present within the subclavian vein. Axillary Vein: No evidence of thrombus. Normal compressibility, respiratory phasicity and response to augmentation. Cephalic Vein: No evidence of thrombus. Normal compressibility, respiratory phasicity and response to augmentation. Basilic Vein: No evidence of thrombus. Normal compressibility, respiratory phasicity and response to augmentation. Brachial Veins: No evidence of thrombus. Normal compressibility, respiratory phasicity and response to augmentation. Radial Veins: No evidence of thrombus. Normal compressibility, respiratory phasicity and response to augmentation. Ulnar Veins: No evidence of thrombus. Normal compressibility, respiratory phasicity and response to augmentation. IMPRESSION: Acute occlusive thrombus within the left internal jugular vein. Acute nonocclusive thrombus within the left subclavian vein as well. Remaining venous structures of the left upper extremity are otherwise patent. Electronically Signed   By: Jasmine PangKim  Fujinaga M.D.   On: 06/14/2016 03:14   Ct Angio Chest Aorta W And/or Wo Contrast  Result Date: 06/13/2016 CLINICAL DATA:  Chest pain radiating to jaw for 3 days. Shortness of breath, dizziness, and lightheadedness. Clinical suspicion for thoracic aortic dissection. EXAM: CT ANGIOGRAPHY CHEST WITH CONTRAST TECHNIQUE: Multidetector CT imaging of the chest was performed using the standard protocol during bolus administration of intravenous contrast. Multiplanar CT image reconstructions and MIPs were obtained to evaluate the vascular anatomy. CONTRAST:  100 mL Isovue 370 COMPARISON:  None. FINDINGS: Cardiovascular: No evidence of thoracic aortic aneurysm or dissection. No evidence of mediastinal hematoma. No central pulmonary emboli identified. Normal heart size. No evidence of pericardial effusion. Mediastinum/Nodes: No soft tissue masses, lymphadenopathy, or mediastinal hematoma identified.  Hazy density is seen in anterior mediastinum surrounding the left innominate vein, and tracking peripherally along the subclavian and axillary veins. This is nonspecific but may be due to upper extremity DVT/thrombophlebitis. Lungs/Pleura: No pulmonary mass, infiltrate, or effusion. Upper abdomen: No acute findings. Several tiny calcified gallstones noted, without evidence of cholecystitis. Musculoskeletal: No suspicious bone lesions or other significant abnormality identified. IMPRESSION: No evidence of thoracic aortic aneurysm or dissection. Hazy density in anterior mediastinum surrounding left innominate vein, and tracking peripherally along the left subclavian and axillary veins. This is nonspecific, but may be due to upper extremity DVT/thrombophlebitis. Recommend upper extremity venous Doppler ultrasound for further evaluation. No active lung disease. Cholelithiasis.  No radiographic evidence of cholecystitis. Electronically Signed   By: Myles RosenthalJohn  Stahl M.D.   On: 06/13/2016 22:16   ASSESSMENT AND PLAN:    Accelerated hypertension  Try to taper Nicardipine drip and started lopressor and HCTZ.  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 CTA chest showed no significant abnormalities except for possible thrombosis in his left upper extremity vessels.  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 Started heparin drip. Thrombosis workup per hematologist.  Transition to Eliquis and follow-up as outpatient per vascular surgeon.  Acute respiratory failure with hypoxia. The patient is hypoxic at 87% in room air just now, in addition, he complains of chest pain. I will get CTA PE protocol to rule out PE. Started oxygen [place] 6 L to keep SAT to 95%,  now on 2 L.  Hypokalemia. Give potassium supplement and a follow-up level.  Tobacco abuse. Smoking cessation was counseled for 3 minutes, nicotine patch.   All the records are reviewed and case discussed with Care  Management/Social Worker. Management plans discussed with the patient, family and they are in agreement.  CODE STATUS: Full code  TOTAL Critical TIME TAKING CARE OF  THIS PATIENT: 46 minutes.   More than 50% of the time was spent in counseling/coordination of care: YES  POSSIBLE D/C IN 2 DAYS, DEPENDING ON CLINICAL CONDITION.   Shaune Pollackhen, Hester Forget M.D on 06/14/2016 at 4:04 PM  Between 7am to 6pm - Pager - [phone]  After 6pm go to [email or web address] - Scientist, research ([email or web address])password [email or web address]  [employer] [facility]  Hospitalists  Office  [phone]  CC: Primary care physician; No PCP Per Patient  Note: This dictation was prepared with Dragon dictation along with smaller phrase technology. Any transcriptional errors that result from this process are unintentional.

## 2016-06-14 NOTE — Progress Notes (Signed)
Called Dr. Imogene Burnhen to report that pt was having chest pain and oxygen sats dropped to 88 to 89 on RA. Had to turn on O2, but could not get sats above 90 until pt was on 6L. Now down to 2L at 94-95%. MD asked about BP. Still elevated and Cardene was turned up to 7.5 due to systolic 190s and 180s. Down to 160s now. MD increased metoprolol and added HCTZ, with one dose now. Will continue to monitor.

## 2016-06-14 NOTE — Consult Note (Signed)
[facility] Vascular Consult Note  MRN : [id number]  Ricky Leonard is a 35 y.o. (February 14, 1981) male who presents with chief complaint of  Chief Complaint  Patient presents with  . Chest Pain   History of Present Illness:   Ricky Leonard is a 35 year old male who presented to [facility] ED with a chief complaint of  substernal chest discomfort radiating up into the jaw over the last couple of days. Patient's had a history of hypertensive urgency with previous admission here 2 years ago. The patient also endorsed a history of some swelling in his left upper extremity progressing over the last couple of days. Patient denies any fever, nausea or vomiting. Denies any SOB. Patent denies any blood draws, IVs, central lines or intravenous drug use in either extremity.   Cardiac workup negative. Vascular ultrasound on 06/14/16 notable for acute occlusive thrombus within the left internal jugular vein, acute nonocclusive thrombus within the left subclavian vein as well. Remaining venous structures of the left upper extremity are otherwise patent.  Patient reports improvement in his symptoms today. Currently on heparin gtt.   Vascular surgery consulted by Dr. Imogene Burnhen for further recommendations.   Current Facility-Administered Medications  Medication Dose Route Frequency Provider Last Rate Last Dose  . acetaminophen (TYLENOL) tablet 650 mg  650 mg Oral Q6H PRN Oralia Manisavid Willis, MD       Or  . acetaminophen (TYLENOL) suppository 650 mg  650 mg Rectal Q6H PRN Oralia Manisavid Willis, MD      . heparin ADULT infusion 100 units/mL (25000 units/25150mL sodium chloride 0.45%)  1,650 Units/hr Intravenous Continuous Shaune PollackQing Chen, MD 16.5 mL/hr at 06/14/16 0856 1,650 Units/hr at 06/14/16 0856  . HYDROcodone-acetaminophen (NORCO/VICODIN) 5-325 MG per tablet 1-2 tablet  1-2 tablet Oral Q4H PRN Oralia Manisavid Willis, MD      . metoprolol tartrate (LOPRESSOR) tablet 25 mg  25 mg Oral BID Shaune PollackQing Chen, MD   25 mg at 06/14/16 0806   . nicardipine (CARDENE) 20mg  in 0.86% saline 200ml IV infusion (0.1 mg/ml)  3-15 mg/hr Intravenous Continuous Oralia Manisavid Willis, MD 50 mL/hr at 06/14/16 1204 5 mg/hr at 06/14/16 1204  . ondansetron (ZOFRAN) tablet 4 mg  4 mg Oral Q6H PRN Oralia Manisavid Willis, MD       Or  . ondansetron [place](ZOFRAN) injection 4 mg  4 mg Intravenous Q6H PRN Oralia Manisavid Willis, MD      . sodium chloride flush (NS) 0.9 % injection 3 mL  3 mL Intravenous Q12H Oralia Manisavid Willis, MD   3 mL at 06/14/16 0200   Past Medical History:  Diagnosis Date  . Hypertension    Past Surgical History:  Procedure Laterality Date  . NO PAST SURGERIES     Social History Social History  Substance Use Topics  . Smoking status: Current Every Day Smoker    Packs/day: 0.50    Types: Cigarettes  . Smokeless tobacco: Never Used  . Alcohol use Yes   Family History Family History  Problem Relation Age of Onset  . Hypertension Other   Denies family history of bleeding / clotting disorders, PAD or renal disease.   No Known Allergies  REVIEW OF SYSTEMS (Negative unless checked)  Constitutional: [] Weight loss  [] Fever  [] Chills Cardiac: [x] Chest pain   [x] Chest pressure   [x] Palpitations   [] Shortness of breath when laying flat   [] Shortness of breath at rest   [x] Shortness of breath with exertion. Vascular:  [] Pain in legs with walking   [] Pain in  legs at rest   [] Pain in legs when laying flat   [] Claudication   [] Pain in feet when walking  [] Pain in feet at rest  [] Pain in feet when laying flat   [] History of DVT   [] Phlebitis   [] Swelling in legs   [] Varicose veins   [] Non-healing ulcers Pulmonary:   [] Uses home oxygen   [] Productive cough   [] Hemoptysis   [] Wheeze  [] COPD   [] Asthma Neurologic:  [] Dizziness  [] Blackouts   [] Seizures   [] History of stroke   [] History of TIA  [] Aphasia   [] Temporary blindness   [] Dysphagia   [] Weakness or numbness in arms   [] Weakness or numbness in legs Musculoskeletal:  [] Arthritis   [] Joint swelling   [] Joint pain    [] Low back pain Hematologic:  [] Easy bruising  [] Easy bleeding   [] Hypercoagulable state   [] Anemic  [] Hepatitis Gastrointestinal:  [] Blood in stool   [] Vomiting blood  [] Gastroesophageal reflux/heartburn   [] Difficulty swallowing. Genitourinary:  [] Chronic kidney disease   [] Difficult urination  [] Frequent urination  [] Burning with urination   [] Blood in urine Skin:  [] Rashes   [] Ulcers   [] Wounds Psychological:  [] History of anxiety   []  History of major depression.  Physical Examination  Vitals:   06/14/16 0800 06/14/16 0900 06/14/16 1000 06/14/16 1100  BP: (!) 168/96 (!) 167/99 (!) 166/96 (!) 175/101  Pulse: (!) 103 92 94 97  Resp: 19 19 (!) 25 (!) 21  Temp:      TempSrc:      SpO2: 91% 91% 91% 92%  Weight:      Height:       Body mass index is 35.87 kg/m. Gen:  WD/WN, NAD Head: [place]/AT, No temporalis wasting. Prominent temp pulse not noted. Ear/Nose/Throat: Hearing grossly intact, nares w/o erythema or drainage, oropharynx w/o Erythema/Exudate Eyes: Sclera non-icteric, conjunctiva clear Neck: Trachea midline.  No JVD.  Pulmonary:  Good air movement, respirations not labored, equal bilaterally.  Cardiac: RRR, normal S1, S2. Vascular:  Vessel Right Left  Radial Palpable Palpable  Ulnar Palpable Palpable  Brachial Palpable Palpable  Carotid Palpable, without bruit Palpable, without bruit  Aorta Not palpable N/A  Femoral Palpable Palpable  Popliteal Palpable Palpable  PT Palpable Palpable  DP Palpable Palpable   Left Upper Extremity: No acute vascular compromise noted. Mild-Moderate Edema noted in extremity. No facial edema noted.   Gastrointestinal: soft, non-tender/non-distended. No guarding/reflex.  Musculoskeletal: M/S 5/5 throughout.  Extremities without ischemic changes.  No deformity or atrophy. No edema. Neurologic: Sensation grossly intact in extremities.  Symmetrical.  Speech is fluent. Motor exam as listed above. Psychiatric: Judgment intact, Mood & affect  appropriate for pt's clinical situation. Dermatologic: No rashes or ulcers noted.  No cellulitis or open wounds. Lymph : No Cervical, Axillary, or Inguinal lymphadenopathy.  CBC Lab Results  Component Value Date   WBC 8.6 06/14/2016   HGB 15.0 06/14/2016   HCT 42.4 06/14/2016   MCV 91.6 06/14/2016   PLT 199 06/14/2016   BMET    Component Value Date/Time   NA 137 06/14/2016 0515   NA 137 07/02/2014 0446   K 3.4 (L) 06/14/2016 0515   K 3.8 07/02/2014 0446   CL 103 06/14/2016 0515   CL 103 07/02/2014 0446   CO2 26 06/14/2016 0515   CO2 27 07/02/2014 0446   GLUCOSE 115 (H) 06/14/2016 0515   GLUCOSE 171 (H) 07/02/2014 0446   BUN 13 06/14/2016 0515   BUN 12 07/02/2014 0446   CREATININE 1.14 06/14/2016  0515   CREATININE 1.20 07/02/2014 0446   CALCIUM 9.4 06/14/2016 0515   CALCIUM 9.8 07/02/2014 0446   GFRNONAA >60 06/14/2016 0515   GFRNONAA >60 07/02/2014 0446   GFRNONAA >60 07/20/2013 1444   GFRAA >60 06/14/2016 0515   GFRAA >60 07/02/2014 0446   GFRAA >60 07/20/2013 1444   Estimated Creatinine Clearance: 113 mL/min (by C-G formula based on SCr of 1.14 mg/dL).  COAG Lab Results  Component Value Date   INR 1.03 06/14/2016   Radiology Dg Chest 2 View  Result Date: 06/13/2016 CLINICAL DATA:  Central chest pain radiating to the jaw for 2-3 days EXAM: CHEST  2 VIEW COMPARISON:  07/01/2014 CXR FINDINGS: The heart size and mediastinal contours are within normal limits. Both lungs are clear. The visualized skeletal structures are without acute abnormality. IMPRESSION: No active cardiopulmonary disease. Electronically Signed   By: Tollie Eth M.D.   On: 06/13/2016 19:28   US Venous Img Upper Uni Left  Result Date: 06/14/2016 CLINICAL DATA:  Upper extremity swelling EXAM: Left UPPER EXTREMITY VENOUS DOPPLER ULTRASOUND TECHNIQUE: Gray-scale sonography with graded compression, as well as color Doppler and duplex ultrasound were performed to evaluate the upper extremity deep venous  system from the level of the subclavian vein and including the jugular, axillary, basilic, radial, ulnar and upper cephalic vein. Spectral Doppler was utilized to evaluate flow at rest and with distal augmentation maneuvers. COMPARISON:  CT 06/13/2016 FINDINGS: Contralateral Subclavian Vein: Respiratory phasicity is normal and symmetric with the symptomatic side. No evidence of thrombus. Normal compressibility. Internal Jugular Vein: Expanded with echogenic occlusive thrombus. Subclavian Vein: Not compressible. Nonocclusive thrombus present within the subclavian vein. Axillary Vein: No evidence of thrombus. Normal compressibility, respiratory phasicity and response to augmentation. Cephalic Vein: No evidence of thrombus. Normal compressibility, respiratory phasicity and response to augmentation. Basilic Vein: No evidence of thrombus. Normal compressibility, respiratory phasicity and response to augmentation. Brachial Veins: No evidence of thrombus. Normal compressibility, respiratory phasicity and response to augmentation. Radial Veins: No evidence of thrombus. Normal compressibility, respiratory phasicity and response to augmentation. Ulnar Veins: No evidence of thrombus. Normal compressibility, respiratory phasicity and response to augmentation. IMPRESSION: Acute occlusive thrombus within the left internal jugular vein. Acute nonocclusive thrombus within the left subclavian vein as well. Remaining venous structures of the left upper extremity are otherwise patent. Electronically Signed   By: Jasmine Pang M.D.   On: 06/14/2016 03:14   Ct Angio Chest Aorta W And/or Wo Contrast  Result Date: 06/13/2016 CLINICAL DATA:  Chest pain radiating to jaw for 3 days. Shortness of breath, dizziness, and lightheadedness. Clinical suspicion for thoracic aortic dissection. EXAM: CT ANGIOGRAPHY CHEST WITH CONTRAST TECHNIQUE: Multidetector CT imaging of the chest was performed using the standard protocol during bolus  administration of intravenous contrast. Multiplanar CT image reconstructions and MIPs were obtained to evaluate the vascular anatomy. CONTRAST:  100 mL Isovue 370 COMPARISON:  None. FINDINGS: Cardiovascular: No evidence of thoracic aortic aneurysm or dissection. No evidence of mediastinal hematoma. No central pulmonary emboli identified. Normal heart size. No evidence of pericardial effusion. Mediastinum/Nodes: No soft tissue masses, lymphadenopathy, or mediastinal hematoma identified. Hazy density is seen in anterior mediastinum surrounding the left innominate vein, and tracking peripherally along the subclavian and axillary veins. This is nonspecific but may be due to upper extremity DVT/thrombophlebitis. Lungs/Pleura: No pulmonary mass, infiltrate, or effusion. Upper abdomen: No acute findings. Several tiny calcified gallstones noted, without evidence of cholecystitis. Musculoskeletal: No suspicious bone lesions or other significant abnormality identified.  IMPRESSION: No evidence of thoracic aortic aneurysm or dissection. Hazy density in anterior mediastinum surrounding left innominate vein, and tracking peripherally along the left subclavian and axillary veins. This is nonspecific, but may be due to upper extremity DVT/thrombophlebitis. Recommend upper extremity venous Doppler ultrasound for further evaluation. No active lung disease. Cholelithiasis.  No radiographic evidence of cholecystitis. Electronically Signed   By: Myles RosenthalJohn  Stahl M.D.   On: 06/13/2016 22:16   Assessment/Plan 35 year old male admitted for hypertensive crisis found to have  acute occlusive thrombus within the left internal jugular vein, acute nonocclusive thrombus within the left subclavian vein - Stable  1. 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 No vascular or neurological compromise. Agree with heparin gtt with transition to oral anticoagulation, we prefer Eliquis (5mg  PO BID). Patient to follow up  in our office as out patient for continued care.  2. Accelerated Hypertension: Agree with current plan. Patient will need appropriate out patient cardiology / PCP follow up.  3. Tobacco Abuse. Current every day cigarette smoker. 1/2 a pack a day. Spent 15 minutes discussing the absolute need to cessation. Discussed methods for quitting. Discussed detrimental effects on body especially vascular system. Patient will thin about it. Will prescribe nicotine patch while in house.   Discussed with Dr. Weldon Inchesew  Pancho Rushing A Adriana Lina, PA-C  06/14/2016 1:26 PM

## 2016-06-14 NOTE — Progress Notes (Signed)
ANTICOAGULATION CONSULT NOTE - Initial Consult  Pharmacy Consult for Heparin drip Indication: VTE treatment  No Known Allergies  Patient Measurements: Height: 5\' 10"  (177.8 cm) Weight: 250 lb (113.4 kg) IBW/kg (Calculated) : 73 Heparin Dosing Weight: 98 kg  Vital Signs: Temp: 99.4 F (37.4 C) (12/02 0730) Temp Source: Oral (12/02 0730) BP: 175/101 (12/02 1100) Pulse Rate: 97 (12/02 1100)  Labs:  Recent Labs  06/13/16 1851 06/14/16 0515 06/14/16 0832  HGB 15.2 15.0  --   HCT 44.3 42.4  --   PLT 197 199  --   APTT  --   --  38*  LABPROT  --   --  13.5  INR  --   --  1.03  CREATININE 1.27* 1.14  --   TROPONINI <0.03  --   --     Estimated Creatinine Clearance: 113 mL/min (by C-G formula based on SCr of 1.14 mg/dL).   Medical History: Past Medical History:  Diagnosis Date  . Hypertension     Medications:  Scheduled:  . metoprolol tartrate  25 mg Oral BID  . sodium chloride flush  3 mL Intravenous Q12H   Infusions:  . heparin 1,650 Units/hr (06/14/16 0856)  . niCARDipine 5 mg/hr (06/14/16 1204)    Assessment: 35 yo male present to ED with chest pain. Pharmacy consulted to dose and monitor heparin drip for possible left upper extremity thrombosis.  Goal of Therapy:  Heparin level 0.3-0.7 units/ml Monitor platelets by anticoagulation protocol: Yes   Plan:  Give 5000 units bolus x 1 Start heparin infusion at 1650 units/hr Check anti-Xa level in 6 hours and daily while on heparin Continue to monitor H&H and platelets  HL ordered for 12/2 at 15:00.  Stormy CardKatsoudas,Pietra Zuluaga K, RPh Clinical Pharmacist 06/14/2016,1:23 PM

## 2016-06-14 NOTE — Consult Note (Signed)
[facility]  Date of admission:  06/13/2016  Inpatient day:  06/14/2016  Consulting physician:  Dr. Demetrios Loll   Reason for Consultation:  Acute thrombus within the left internal jugular vein and acute nonocclusive thrombus within the left subclavian vein.  Chief Complaint: Ricky Leonard is a 35 y.o. male with a history of hypertension who is admitted through the emergency room with accelerated hypertension and left internal jugular vein and subclavian vein thrombosis  HPI:  The patient denies any past medical history except for hypertension. He has never had a thrombosis. He denies any left upper extremity IVs.  He denies drug use.  He denies any family history of clot. He notes left upper extremity swelling beginning late yesterday afternoon.  He states that he was moving totes filled with clothes for about an hour prior to the onset of swelling.  He denied any pain.  He presented to the emergency room with chest, bilateral shoulder, and back pain.   He described some shortness of breath.  He was hypertensive with a systolic blood pressure in the 200s. He underwent chest CT angiogram.  Imaging revealed no evidence of thoracic aortic aneurysm or dissection.  There was a hazy density in anterior mediastinum surrounding left innominate vein, and tracking peripherally along the left subclavian and axillary veins.   Left upper extremity duplex on 06/14/2016 revealed an acute occlusive thrombus within the left internal jugular vein.  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 Remaining venous structures of the left upper extremity were otherwise patent.  He was admitted to the ICU.  He was started on heparin.  He denies any chest pain.  Symptomatically, he denies any concerns.  He has lost 10 pounds in the past month secondary to trouble getting food.   Past Medical History:  Diagnosis Date  . Hypertension     Past Surgical History:  Procedure  Laterality Date  . NO PAST SURGERIES      Family History  Problem Relation Age of Onset  . Hypertension Other     Social History:  reports that he has been smoking Cigarettes.  He has been smoking about 0.50 packs per day. He has never used smokeless tobacco. He reports that he drinks alcohol. He reports that he does not use drugs.  He has smoked half a pack a day for the past 9 years.  He has used marijuana. He denies any alcohol use.  He is unemployed.  He is alone today.  Allergies: No Known Allergies  No prescriptions prior to admission.    Review of Systems: GENERAL:  Feels "ok".  No fevers or sweats.  Weight loss of 10 pounds in past month. PERFORMANCE STATUS (ECOG):  1 HEENT:  No visual changes, runny nose, sore throat, mouth sores or tenderness. Lungs: Shortness of breath, resolved.  No cough.  No hemoptysis. Cardiac:  Chest pain, resolved.  No palpitations, orthopnea, or PND. GI:  No nausea, vomiting, diarrhea, constipation, melena or hematochezia. GU:  No urgency, frequency, dysuria, or hematuria. Musculoskeletal:  Bilateral shoulder and back pain.  No joint pain.  No muscle tenderness. Extremities:  Left upper extremity edema.  No pain. Skin:  No rashes or skin changes. Neuro:  No headache, numbness or weakness, balance or coordination issues. Endocrine:  No diabetes, thyroid issues, hot flashes or night sweats. Psych:  No mood changes, depression or anxiety. Pain:  No focal pain. Review of systems:  All other systems reviewed and found  to be negative.  Physical Exam:  Blood pressure (!) 141/113, pulse (!) 102, temperature 99.4 F (37.4 C), temperature source Oral, resp. rate (!) 23, height 5' 10" (1.778 m), weight 250 lb (113.4 kg), SpO2 91 %.  GENERAL:  Well developed, well nourished, gentleman sitting comfortably in the medical ICU in no acute distress. MENTAL STATUS:  Alert and oriented to person, place and time. HEAD:  Brown hair with beard.  Normocephalic,  atraumatic, face symmetric, no Cushingoid features. EYES:  Brown eyes.  Pupils equal round and reactive to light and accomodation.  No conjunctivitis or scleral icterus. ENT:  Oropharynx clear without lesion.  Tongue normal. Mucous membranes moist.  RESPIRATORY:  Clear to auscultation without rales, wheezes or rhonchi. CARDIOVASCULAR:  Regular rate and rhythm without murmur, rub or gallop. CHEST WALL:  No dilated veins. ABDOMEN:  Soft, non-tender, with active bowel sounds, and no appreciable hepatosplenomegaly.  No masses. SKIN:  No rashes, ulcers or lesions. EXTREMITIES: No edema, no skin discoloration or tenderness.  No palpable cords. LYMPH NODES: No palpable cervical, supraclavicular, axillary or inguinal adenopathy  NEUROLOGICAL: Unremarkable. PSYCH:  Appropriate.   Results for orders placed or performed during the hospital encounter of 06/13/16 (from the past 48 hour(s))  Basic metabolic panel     Status: Abnormal   Collection Time: 06/13/16  6:51 PM  Result Value Ref Range   Sodium 137 135 - 145 mmol/L   Potassium 3.5 3.5 - 5.1 mmol/L   Chloride 104 101 - 111 mmol/L   CO2 26 22 - 32 mmol/L   Glucose, Bld 83 65 - 99 mg/dL   BUN 18 6 - 20 mg/dL   Creatinine, Ser 1.27 (H) 0.61 - 1.24 mg/dL   Calcium 9.6 8.9 - 10.3 mg/dL   GFR calc non Af Amer >60 >60 mL/min   GFR calc Af Amer >60 >60 mL/min    Comment: (NOTE) The eGFR has been calculated using the CKD EPI equation. This calculation has not been validated in all clinical situations. eGFR's persistently <60 mL/min signify possible Chronic Kidney Disease.    Anion gap 7 5 - 15  CBC     Status: None   Collection Time: 06/13/16  6:51 PM  Result Value Ref Range   WBC 8.5 3.8 - 10.6 K/uL   RBC 4.73 4.40 - 5.90 MIL/uL   Hemoglobin 15.2 13.0 - 18.0 g/dL   HCT 44.3 40.0 - 52.0 %   MCV 93.7 80.0 - 100.0 fL   MCH 32.1 26.0 - 34.0 pg   MCHC 34.3 32.0 - 36.0 g/dL   RDW 13.4 11.5 - 14.5 %   Platelets 197 150 - 440 K/uL  Troponin I      Status: None   Collection Time: 06/13/16  6:51 PM  Result Value Ref Range   Troponin I <0.03 <0.03 ng/mL  Urine Drug Screen, Qualitative ([facility] only)     Status: Abnormal   Collection Time: 06/14/16 12:12 AM  Result Value Ref Range   Tricyclic, Ur Screen NONE DETECTED NONE DETECTED   Amphetamines, Ur Screen NONE DETECTED NONE DETECTED   MDMA (Ecstasy)Ur Screen NONE DETECTED NONE DETECTED   Cocaine Metabolite,Ur [place] NONE DETECTED NONE DETECTED   Opiate, Ur Screen NONE DETECTED NONE DETECTED   Phencyclidine (PCP) Ur S NONE DETECTED NONE DETECTED   Cannabinoid 50 Ng, Ur [place] POSITIVE (A) NONE DETECTED   Barbiturates, Ur Screen NONE DETECTED NONE DETECTED   Benzodiazepine, Ur Scrn NONE DETECTED NONE DETECTED   Methadone Scn,  Ur NONE DETECTED NONE DETECTED    Comment: (NOTE) 967  Tricyclics, urine               Cutoff 1000 ng/mL 200  Amphetamines, urine             Cutoff 1000 ng/mL 300  MDMA (Ecstasy), urine           Cutoff 500 ng/mL 400  Cocaine Metabolite, urine       Cutoff 300 ng/mL 500  Opiate, urine                   Cutoff 300 ng/mL 600  Phencyclidine (PCP), urine      Cutoff 25 ng/mL 700  Cannabinoid, urine              Cutoff 50 ng/mL 800  Barbiturates, urine             Cutoff 200 ng/mL 900  Benzodiazepine, urine           Cutoff 200 ng/mL 1000 Methadone, urine                Cutoff 300 ng/mL 1100 1200 The urine drug screen provides only a preliminary, unconfirmed 1300 analytical test result and should not be used for non-medical 1400 purposes. Clinical consideration and professional judgment should 1500 be applied to any positive drug screen result due to possible 1600 interfering substances. A more specific alternate chemical method 1700 must be used in order to obtain a confirmed analytical result.  1800 Gas chromato graphy / mass spectrometry (GC/MS) is the preferred 1900 confirmatory method.   MRSA PCR Screening     Status: None   Collection Time: 06/14/16  1:58 AM   Result Value Ref Range   MRSA by PCR NEGATIVE NEGATIVE    Comment:        The GeneXpert MRSA Assay (FDA approved for NASAL specimens only), is one component of a comprehensive MRSA colonization surveillance program. It is not intended to diagnose MRSA infection nor to guide or monitor treatment for MRSA infections.   Basic metabolic panel     Status: Abnormal   Collection Time: 06/14/16  5:15 AM  Result Value Ref Range   Sodium 137 135 - 145 mmol/L   Potassium 3.4 (L) 3.5 - 5.1 mmol/L   Chloride 103 101 - 111 mmol/L   CO2 26 22 - 32 mmol/L   Glucose, Bld 115 (H) 65 - 99 mg/dL   BUN 13 6 - 20 mg/dL   Creatinine, Ser 1.14 0.61 - 1.24 mg/dL   Calcium 9.4 8.9 - 10.3 mg/dL   GFR calc non Af Amer >60 >60 mL/min   GFR calc Af Amer >60 >60 mL/min    Comment: (NOTE) The eGFR has been calculated using the CKD EPI equation. This calculation has not been validated in all clinical situations. eGFR's persistently <60 mL/min signify possible Chronic Kidney Disease.    Anion gap 8 5 - 15  CBC     Status: None   Collection Time: 06/14/16  5:15 AM  Result Value Ref Range   WBC 8.6 3.8 - 10.6 K/uL   RBC 4.63 4.40 - 5.90 MIL/uL   Hemoglobin 15.0 13.0 - 18.0 g/dL   HCT 42.4 40.0 - 52.0 %   MCV 91.6 80.0 - 100.0 fL   MCH 32.4 26.0 - 34.0 pg   MCHC 35.4 32.0 - 36.0 g/dL   RDW 13.4 11.5 - 14.5 %   Platelets 199 150 -  440 K/uL  APTT     Status: Abnormal   Collection Time: 06/14/16  8:32 AM  Result Value Ref Range   aPTT 38 (H) 24 - 36 seconds    Comment:        IF BASELINE aPTT IS ELEVATED, SUGGEST PATIENT RISK ASSESSMENT BE USED TO DETERMINE APPROPRIATE ANTICOAGULANT THERAPY.   Protime-INR     Status: None   Collection Time: 06/14/16  8:32 AM  Result Value Ref Range   Prothrombin Time 13.5 11.4 - 15.2 seconds   INR 1.03    Dg Chest 2 View  Result Date: 06/13/2016 CLINICAL DATA:  Central chest pain radiating to the jaw for 2-3 days EXAM: CHEST  2 VIEW COMPARISON:  07/01/2014  CXR FINDINGS: The heart size and mediastinal contours are within normal limits. Both lungs are clear. The visualized skeletal structures are without acute abnormality. IMPRESSION: No active cardiopulmonary disease. Electronically Signed   By: Ashley Royalty M.D.   On: 06/13/2016 19:28   US Venous Img Upper Uni Left  Result Date: 06/14/2016 CLINICAL DATA:  Upper extremity swelling EXAM: Left UPPER EXTREMITY VENOUS DOPPLER ULTRASOUND TECHNIQUE: Gray-scale sonography with graded compression, as well as color Doppler and duplex ultrasound were performed to evaluate the upper extremity deep venous system from the level of the subclavian vein and including the jugular, axillary, basilic, radial, ulnar and upper cephalic vein. Spectral Doppler was utilized to evaluate flow at rest and with distal augmentation maneuvers. COMPARISON:  CT 06/13/2016 FINDINGS: Contralateral Subclavian Vein: Respiratory phasicity is normal and symmetric with the symptomatic side. No evidence of thrombus. Normal compressibility. Internal Jugular Vein: Expanded with echogenic occlusive thrombus. Subclavian Vein: Not compressible. Nonocclusive thrombus present within the subclavian vein. Axillary Vein: No evidence of thrombus. Normal compressibility, respiratory phasicity and response to augmentation. Cephalic Vein: No evidence of thrombus. Normal compressibility, respiratory phasicity and response to augmentation. Basilic Vein: No evidence of thrombus. Normal compressibility, respiratory phasicity and response to augmentation. Brachial Veins: No evidence of thrombus. Normal compressibility, respiratory phasicity and response to augmentation. Radial Veins: No evidence of thrombus. Normal compressibility, respiratory phasicity and response to augmentation. Ulnar Veins: No evidence of thrombus. Normal compressibility, respiratory phasicity and response to augmentation. IMPRESSION: Acute occlusive thrombus within the left internal jugular vein.  Acute nonocclusive thrombus within the left subclavian vein as well. Remaining venous structures of the left upper extremity are otherwise patent. Electronically Signed   By: Donavan Foil M.D.   On: 06/14/2016 03:14   Ct Angio Chest Aorta W And/or Wo Contrast  Result Date: 06/13/2016 CLINICAL DATA:  Chest pain radiating to jaw for 3 days. Shortness of breath, dizziness, and lightheadedness. Clinical suspicion for thoracic aortic dissection. EXAM: CT ANGIOGRAPHY CHEST WITH CONTRAST TECHNIQUE: Multidetector CT imaging of the chest was performed using the standard protocol during bolus administration of intravenous contrast. Multiplanar CT image reconstructions and MIPs were obtained to evaluate the vascular anatomy. CONTRAST:  100 mL Isovue 370 COMPARISON:  None. FINDINGS: Cardiovascular: No evidence of thoracic aortic aneurysm or dissection. No evidence of mediastinal hematoma. No central pulmonary emboli identified. Normal heart size. No evidence of pericardial effusion. Mediastinum/Nodes: No soft tissue masses, lymphadenopathy, or mediastinal hematoma identified. Hazy density is seen in anterior mediastinum surrounding the left innominate vein, and tracking peripherally along the subclavian and axillary veins. This is nonspecific but may be due to upper extremity DVT/thrombophlebitis. Lungs/Pleura: No pulmonary mass, infiltrate, or effusion. Upper abdomen: No acute findings. Several tiny calcified gallstones noted, without evidence of cholecystitis.  Musculoskeletal: No suspicious bone lesions or other significant abnormality identified. IMPRESSION: No evidence of thoracic aortic aneurysm or dissection. Hazy density in anterior mediastinum surrounding left innominate vein, and tracking peripherally along the left subclavian and axillary veins. This is nonspecific, but may be due to upper extremity DVT/thrombophlebitis. Recommend upper extremity venous Doppler ultrasound for further evaluation. No active lung  disease. Cholelithiasis.  No radiographic evidence of cholecystitis. Electronically Signed   By: Earle Gell M.D.   On: 06/13/2016 22:16    Assessment:  The patient is a 35 y.o. gentleman with admitted with acceaerated hypertension and left upper extremity edema secondary to thrombosis in the left subclavian and left internal jugular vein.  Clot occurred after moving/lifting totes.  He has no history of prior thrombosis.  He denies any left upper extremity IV access or drug use.  He smokes.  He has no symptoms worrisome for malignancy.  CBC is normal.  He has no family history of thrombosis.  Plan:   1.  Discuss limited hypercoagulable work-up.  No current evidence of thoracic outlet abnormality.  Discuss plan for anticoagulation for minimum of 3-6 months.  Vascular to assess role of thrombolysis.  2.  Continue heparin with plan to convert to oral anticoagulation (Coumadin, Eliquis, or Xarelto). 3.  Encourage smoking cessation. 4.  Anticipate follow-up in outpatient department.   Thank you for allowing me to participate in [place] 's care.  I will follow him closely with you while hospitalized and after discharge in the outpatient department.  Lequita Asal, MD  06/14/2016, 2:00 PM

## 2016-06-15 ENCOUNTER — Inpatient Hospital Stay
Admit: 2016-06-15 | Discharge: 2016-06-15 | Disposition: A | Discharge status: Home / Self Care | Payer: Medicaid Other | Attending: Internal Medicine | Admitting: Internal Medicine

## 2016-06-15 LAB — BASIC METABOLIC PANEL
Anion gap: 6 (ref 5–15)
BUN: 12 mg/dL (ref 6–20)
CALCIUM: 9.2 mg/dL (ref 8.9–10.3)
CO2: 25 mmol/L (ref 22–32)
CREATININE: 1.03 mg/dL (ref 0.61–1.24)
Chloride: 102 mmol/L (ref 101–111)
GFR calc Af Amer: >60 mL/min (ref >60)
GFR calc non Af Amer: >60 mL/min (ref >60)
GLUCOSE: 108 mg/dL — AB (ref 65–99)
Potassium: 3.7 mmol/L (ref 3.5–5.1)
Sodium: 133 mmol/L — ABNORMAL LOW (ref 135–145)

## 2016-06-15 LAB — TROPONIN I
Troponin I: 0.22 ng/mL (ref <0.03)
Troponin I: 0.21 ng/mL (ref <0.03)

## 2016-06-15 LAB — LIPID PANEL
CHOL/HDL RATIO: 2.9 ratio
Cholesterol: 188 mg/dL (ref 0–200)
HDL: 64 mg/dL (ref >40)
LDL CALC: 108 mg/dL — AB (ref 0–99)
TRIGLYCERIDES: 80 mg/dL (ref <150)
VLDL: 16 mg/dL (ref 0–40)

## 2016-06-15 LAB — CBC
HEMATOCRIT: 42.2 % (ref 40.0–52.0)
Hemoglobin: 14.7 g/dL (ref 13.0–18.0)
MCH: 32.5 pg (ref 26.0–34.0)
MCHC: 34.9 g/dL (ref 32.0–36.0)
MCV: 93.1 fL (ref 80.0–100.0)
Platelets: 208 10*3/uL (ref 150–440)
RBC: 4.53 MIL/uL (ref 4.40–5.90)
RDW: 13.6 % (ref 11.5–14.5)
WBC: 10.7 10*3/uL — ABNORMAL HIGH (ref 3.8–10.6)

## 2016-06-15 LAB — ECHOCARDIOGRAM COMPLETE
HEIGHTINCHES: 70 in
Weight: 4000 oz

## 2016-06-15 LAB — HEPARIN LEVEL (UNFRACTIONATED): Heparin Unfractionated: 0.46 IU/mL (ref 0.30–0.70)

## 2016-06-15 LAB — MAGNESIUM: Magnesium: 1.9 mg/dL (ref 1.7–2.4)

## 2016-06-15 MED ORDER — APIXABAN 5 MG PO TABS
10.0000 mg | ORAL_TABLET | Freq: Two times a day (BID) | ORAL | Status: DC
Start: 2016-06-15 — End: 2016-06-16
  Administered 2016-06-15 – 2016-06-16 (×3): 10 mg via ORAL
  Filled 2016-06-15 (×3): qty 2

## 2016-06-15 MED ORDER — LISINOPRIL 10 MG PO TABS
10.0000 mg | ORAL_TABLET | Freq: Every day | ORAL | Status: DC
  Administered 2016-06-15 – 2016-06-16 (×2): 10 mg via ORAL
  Filled 2016-06-15 (×2): qty 1

## 2016-06-15 MED ORDER — HEPARIN (PORCINE) IN NACL 100-0.45 UNIT/ML-% IJ SOLN: 1300.0000 [IU]/h | INTRAMUSCULAR | Status: DC

## 2016-06-15 MED ORDER — CARVEDILOL 6.25 MG PO TABS
12.5000 mg | ORAL_TABLET | Freq: Two times a day (BID) | ORAL | Status: DC
  Administered 2016-06-16: 12.5 mg via ORAL
  Filled 2016-06-15: qty 2

## 2016-06-15 MED ORDER — APIXABAN 5 MG PO TABS: 5.0000 mg | ORAL_TABLET | Freq: Two times a day (BID) | ORAL | Status: DC

## 2016-06-15 MED ORDER — HYDROCHLOROTHIAZIDE 25 MG PO TABS
25.0000 mg | ORAL_TABLET | Freq: Every day | ORAL | Status: DC
  Administered 2016-06-15 – 2016-06-16 (×2): 25 mg via ORAL
  Filled 2016-06-15 (×2): qty 1

## 2016-06-15 MED ORDER — ATORVASTATIN CALCIUM 20 MG PO TABS
40.0000 mg | ORAL_TABLET | Freq: Every day | ORAL | Status: DC
  Administered 2016-06-15: 40 mg via ORAL
  Filled 2016-06-15: qty 2

## 2016-06-15 MED ORDER — CARVEDILOL 6.25 MG PO TABS
12.5000 mg | ORAL_TABLET | Freq: Once | ORAL | Status: AC
Start: 2016-06-15 — End: 2016-06-15
  Administered 2016-06-15: 12.5 mg via ORAL
  Filled 2016-06-15: qty 2

## 2016-06-15 NOTE — Progress Notes (Signed)
ANTICOAGULATION CONSULT NOTE - Initial Consult  Pharmacy Consult for Eliquis (Apixaban) Indication: DVT  No Known Allergies  Patient Measurements: Height: 5\' 10"  (177.8 cm) Weight: 250 lb (113.4 kg) IBW/kg (Calculated) : 73  Vital Signs: Temp: 99 F (37.2 C) (12/03 0800) Temp Source: Oral (12/03 0800) BP: 138/67 (12/03 0800) Pulse Rate: 76 (12/03 0800)  Labs:  Recent Labs  06/13/16 1851 06/14/16 0515 06/14/16 0832 06/14/16 1445  HGB 15.2 15.0  --   --   HCT 44.3 42.4  --   --   PLT 197 199  --   --   APTT  --   --  38*  --   LABPROT  --   --  13.5  --   INR  --   --  1.03  --   HEPARINUNFRC  --   --   --  1.38*  CREATININE 1.27* 1.14  --   --   TROPONINI <0.03  --   --   --     Estimated Creatinine Clearance: 113 mL/min (by C-G formula based on SCr of 1.14 mg/dL).   Medical History: Past Medical History:  Diagnosis Date  . Hypertension     Medications:  Scheduled:  . apixaban  10 mg Oral BID   Followed by  . [START ON 06/22/2016] apixaban  5 mg Oral BID  . hydrochlorothiazide  25 mg Oral Daily  . metoprolol tartrate  50 mg Oral BID  . nicotine  21 mg Transdermal Daily  . sodium chloride flush  3 mL Intravenous Q12H   Infusions:  . niCARDipine 3 mg/hr (06/15/16 16100633)    Assessment: 12/2: 35 yo male presented to ED with chest pain. Pharmacy consulted to dose and monitor heparin drip for possible left upper extremity thrombosis.  12/3:  pharmacy consulted to dose Eliquis (Apixaban) for DVT.    Plan:  Discontinued Heparin drip.  Ordered apixaban 6525m PO BID x 7 days, followed by 5mg  BID as per protocol.   Monitor SCr and CBC every 3 days while on therapy.   Jeriah Corkum K,RPh Clinical Pharmacist 06/15/2016,8:21 AM

## 2016-06-15 NOTE — Progress Notes (Signed)
ANTICOAGULATION CONSULT NOTE - Initial Consult  Pharmacy Consult for Heparin drip Indication: VTE treatment  No Known Allergies  Patient Measurements: Height: 5\' 10"  (177.8 cm) Weight: 250 lb (113.4 kg) IBW/kg (Calculated) : 73 Heparin Dosing Weight: 98 kg  Vital Signs: Temp: 98.3 F (36.8 C) (12/02 2000) Temp Source: Oral (12/02 2000) BP: 179/85 (12/02 2300) Pulse Rate: 88 (12/02 2300)  Labs:  Recent Labs  06/13/16 1851 06/14/16 0515 06/14/16 0832 06/14/16 1445  HGB 15.2 15.0  --   --   HCT 44.3 42.4  --   --   PLT 197 199  --   --   APTT  --   --  38*  --   LABPROT  --   --  13.5  --   INR  --   --  1.03  --   HEPARINUNFRC  --   --   --  1.38*  CREATININE 1.27* 1.14  --   --   TROPONINI <0.03  --   --   --     Estimated Creatinine Clearance: 113 mL/min (by C-G formula based on SCr of 1.14 mg/dL).   Medical History: Past Medical History:  Diagnosis Date  . Hypertension     Medications:  Scheduled:  . hydrochlorothiazide  12.5 mg Oral Daily  . metoprolol tartrate  50 mg Oral BID  . nicotine  21 mg Transdermal Daily  . sodium chloride flush  3 mL Intravenous Q12H   Infusions:  . heparin    . niCARDipine 10 mg/hr (06/14/16 2354)    Assessment: 35 yo male present to ED with chest pain. Pharmacy consulted to dose and monitor heparin drip for possible left upper extremity thrombosis.  Goal of Therapy:  Heparin level 0.3-0.7 units/ml Monitor platelets by anticoagulation protocol: Yes   Plan:  Give 5000 units bolus x 1 Start heparin infusion at 1650 units/hr Check anti-Xa level in 6 hours and daily while on heparin Continue to monitor H&H and platelets  HL ordered for 12/2 at 15:00.  12/2 1500 heparin level 1.38. Hold x 1 hour and restart at 1300 units/hr. CBC and heparin level 6 hours after restart.   Aseem Sessums S, RPh Clinical Pharmacist 06/15/2016,12:29 AM

## 2016-06-15 NOTE — Progress Notes (Signed)
[employer] - [place] [facility]   PATIENT NAME: Ricky Leonard    MR#:  [id number]  DATE OF BIRTH:  26-Mar-1981  SUBJECTIVE:  CHIEF COMPLAINT:   Chief Complaint  Patient presents with  . Chest Pain   chest pain this morning. On O2 [place] 2 L due to hypoxia since yesterday. blood pressure is still elevated at 169/122.. On Cardizem drip REVIEW OF SYSTEMS:  Review of Systems  Constitutional: Negative for chills, fever and malaise/fatigue.  HENT: Negative for congestion.   Eyes: Negative for blurred vision and double vision.  Respiratory: Positive for shortness of breath. Negative for cough, hemoptysis, sputum production, wheezing and stridor.   Cardiovascular: Positive for chest pain. Negative for leg swelling.  Gastrointestinal: Negative for abdominal pain, blood in stool, diarrhea, melena, nausea and vomiting.  Genitourinary: Negative for dysuria and hematuria.  Musculoskeletal: Negative for joint pain.  Skin: Negative for rash.  Neurological: Negative for dizziness, tingling, focal weakness, loss of consciousness and headaches.  Psychiatric/Behavioral: Negative for depression. The patient is not nervous/anxious.     DRUG ALLERGIES:  No Known Allergies VITALS:  Blood pressure (!) 169/122, pulse 79, temperature 99 F (37.2 C), temperature source Oral, resp. rate 19, height 5\' 10"  (1.778 m), weight 250 lb (113.4 kg), SpO2 95 %. PHYSICAL EXAMINATION:  Physical Exam  Constitutional: He is oriented to person, place, and time and well-developed, well-nourished, and in no distress.  Obese.  HENT:  Head: Normocephalic.  Mouth/Throat: Oropharynx is clear and moist.  Eyes: Conjunctivae and EOM are normal. Pupils are equal, round, and reactive to light. No scleral icterus.  Neck: Normal range of motion. Neck supple. No JVD present. No tracheal deviation present.  Cardiovascular: Normal rate, regular rhythm and normal heart sounds.  Exam reveals no gallop.   No murmur  heard. Pulmonary/Chest: Effort normal and breath sounds normal. No respiratory distress. He has no wheezes. He has no rales.  Abdominal: He exhibits no distension. There is no tenderness.  Musculoskeletal: Normal range of motion. He exhibits no edema or tenderness.  Neurological: He is alert and oriented to person, place, and time. No cranial nerve deficit.  Skin: No rash noted. No erythema.  Psychiatric: Affect and judgment normal.   LABORATORY PANEL:   CBC  Recent Labs Lab 06/15/16 0758  WBC 10.7*  HGB 14.7  HCT 42.2  PLT 208   ------------------------------------------------------------------------------------------------------------------ Chemistries   Recent Labs Lab 06/15/16 0758  NA 133*  K 3.7  CL 102  CO2 25  GLUCOSE 108*  BUN 12  CREATININE 1.03  CALCIUM 9.2  MG 1.9   RADIOLOGY:  Ct Angio Chest Pe W Or Wo Contrast  Result Date: 06/14/2016 CLINICAL DATA:  New onset of hypoxia and chest pain at 4:15 today. Known upper extremity DVT. The patient is on heparin. EXAM: CT ANGIOGRAPHY CHEST WITH CONTRAST TECHNIQUE: Multidetector CT imaging of the chest was performed using the standard protocol during bolus administration of intravenous contrast. Multiplanar CT image reconstructions and MIPs were obtained to evaluate the vascular anatomy. CONTRAST:  75 mL Isovue 370 COMPARISON:  CTA of the chest 06/13/2016. FINDINGS: Cardiovascular: The heart size is normal. No significant pleural or pericardial effusion is present. Pulmonary artery use fill normally on both sides. There are no focal filling defects to suggest pulmonary emboli. Mediastinum/Nodes: No significant mediastinal or axillary adenopathy is present. Lungs/Pleura: Mild dependent atelectasis is present at the lung bases bilaterally. Small bilateral pleural effusions are noted. Upper Abdomen: Limited imaging of the  upper abdomen is unremarkable. Musculoskeletal: Mild rightward curvature is present in the mid thoracic  spine. Vertebral body heights and alignment are maintained. No focal lytic or blastic lesions are present. Review of the MIP images confirms the above findings. IMPRESSION: 1. No evidence for pulmonary embolus. 2. Mild dependent atelectasis and small effusions bilaterally are new. Electronically Signed   By: Marin Robertshristopher  Mattern M.D.   On: 06/14/2016 17:46   ASSESSMENT AND PLAN:   Hypertension, malignant. Try to taper Nicardipine drip and started and increased lopressor and HCTZ today, add lisinopril.  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 CTA chest showed no significant abnormalities except for possible thrombosis in his left upper extremity vessels.  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 Started heparin drip. Thrombosis workup per hematologist.  Transition to Eliquis today and follow-up vascular surgeon as outpatient.  Acute respiratory failure with hypoxia. The patient is hypoxic at 87% in room air just now, in addition, he complains of chest pain. Repeat CTA PE protocol did not show PE. Started oxygen [place] 6 L to keep SAT to 95%, then on 2 L.  Chest pain with elevated troponin. Abnormal EKG with T-wave abnormality and prolonged QT. Transition to Eliquis, follow-up troponin, lipid panel, echocardiogram and cardiology consult. Continue Lopressor, start lisinopril and Lipitor.   Hypokalemia. Improved with potassium supplement.  Tobacco abuse. Smoking cessation was counseled for 3 minutes, nicotine patch.   All the records are reviewed and case discussed with Care Management/Social Worker. Management plans discussed with the patient, family and they are in agreement.  CODE STATUS: Full code  TOTAL Critical TIME TAKING CARE OF THIS PATIENT: 42 minutes.   More than 50% of the time was spent in counseling/coordination of care: YES  POSSIBLE D/C IN 2 DAYS, DEPENDING ON CLINICAL CONDITION.   Shaune Pollackhen, Ronalee Scheunemann M.D on 06/15/2016 at 2:01  PM  Between 7am to 6pm - Pager - [phone]  After 6pm go to [email or web address] - Scientist, research (life [email or web address])password [email or web address]  [facility]  Office  [phone]  CC: Primary care physician; No PCP Per Patient  Note: This dictation was prepared with Dragon dictation along with smaller phrase technology. Any transcriptional errors that result from this process are unintentional.

## 2016-06-15 NOTE — Progress Notes (Signed)
Ricky Leonard is a 35 y.o. male  [id number]  Primary Cardiologist: Adrian Blackwater Reason for Consultation: Chest pain and elevated troponin  HPI: This is a 35 year old African-American male with a past medical history of uncontrolled hypertension presented to the hospital with chest pain associated with shortness of breath and left arm pain. Apparently patient had CT angiogram and shows left arm swelling is due to thrombus. He has uncontrolled hypertension and there is on nicardipine drip.   Review of Systems: No orthopnea PND or leg swelling   Past Medical History:  Diagnosis Date  . Hypertension     No prescriptions prior to admission.     Marland Kitchen apixaban  10 mg Oral BID   Followed by  . [START ON 06/22/2016] apixaban  5 mg Oral BID  . atorvastatin  40 mg Oral q1800  . hydrochlorothiazide  25 mg Oral Daily  . lisinopril  10 mg Oral Daily  . metoprolol tartrate  50 mg Oral BID  . nicotine  21 mg Transdermal Daily  . sodium chloride flush  3 mL Intravenous Q12H    Infusions: . niCARDipine 5.5 mg/hr (06/15/16 1526)    No Known Allergies  Social History   Social History  . Marital status: Single    Spouse name: N/A  . Number of children: N/A  . Years of education: N/A   Occupational History  . Not on file.   Social History Main Topics  . Smoking status: Current Every Day Smoker    Packs/day: 0.50    Types: Cigarettes  . Smokeless tobacco: Never Used  . Alcohol use Yes  . Drug use: No  . Sexual activity: Not on file   Other Topics Concern  . Not on file   Social History Narrative  . No narrative on file    Family History  Problem Relation Age of Onset  . Hypertension Other     PHYSICAL EXAM: Vitals:   06/15/16 1700 06/15/16 1800  BP: (!) 172/89 (!) 169/87  Pulse: 87 82  Resp: 18 20  Temp:       Intake/Output Summary (Last 24 hours) at 06/15/16 1814 Last data filed at 06/15/16 1800  Gross per 24 hour  Intake          1306.93 ml  Output               825 ml  Net           481.93 ml    General:  Well appearing. No respiratory difficulty HEENT: normal Neck: supple. no JVD. Carotids 2+ bilat; no bruits. No lymphadenopathy or thryomegaly appreciated. Cor: PMI nondisplaced. Regular rate & rhythm. No rubs, gallops or murmurs. Lungs: clear Abdomen: soft, nontender, nondistended. No hepatosplenomegaly. No bruits or masses. Good bowel sounds. Extremities: no cyanosis, clubbing, rash, edema Neuro: alert & oriented x 3, cranial nerves grossly intact. moves all 4 extremities w/o difficulty. Affect pleasant.  WUJ:WJXBJ rhythm with nonspecific ST changes LVH.  Results for orders placed or performed during the hospital encounter of 06/13/16 (from the past 24 hour(s))  Basic metabolic panel     Status: Abnormal   Collection Time: 06/15/16  7:58 AM  Result Value Ref Range   Sodium 133 (L) 135 - 145 mmol/L   Potassium 3.7 3.5 - 5.1 mmol/L   Chloride 102 101 - 111 mmol/L   CO2 25 22 - 32 mmol/L   Glucose, Bld 108 (H) 65 - 99 mg/dL   BUN 12 6 -  20 mg/dL   Creatinine, Ser 1.611.03 0.61 - 1.24 mg/dL   Calcium 9.2 8.9 - 09.610.3 mg/dL   GFR calc non Af Amer >60 >60 mL/min   GFR calc Af Amer >60 >60 mL/min   Anion gap 6 5 - 15  Magnesium     Status: None   Collection Time: 06/15/16  7:58 AM  Result Value Ref Range   Magnesium 1.9 1.7 - 2.4 mg/dL  Heparin level (unfractionated)     Status: None   Collection Time: 06/15/16  7:58 AM  Result Value Ref Range   Heparin Unfractionated 0.46 0.30 - 0.70 IU/mL  CBC     Status: Abnormal   Collection Time: 06/15/16  7:58 AM  Result Value Ref Range   WBC 10.7 (H) 3.8 - 10.6 K/uL   RBC 4.53 4.40 - 5.90 MIL/uL   Hemoglobin 14.7 13.0 - 18.0 g/dL   HCT 04.542.2 40.940.0 - 81.152.0 %   MCV 93.1 80.0 - 100.0 fL   MCH 32.5 26.0 - 34.0 pg   MCHC 34.9 32.0 - 36.0 g/dL   RDW 91.413.6 78.211.5 - 95.614.5 %   Platelets 208 150 - 440 K/uL  Troponin I     Status: Abnormal   Collection Time: 06/15/16  7:58 AM  Result Value Ref Range    Troponin I 0.22 (HH) <0.03 ng/mL  Lipid panel     Status: Abnormal   Collection Time: 06/15/16  7:58 AM  Result Value Ref Range   Cholesterol 188 0 - 200 mg/dL   Triglycerides 80 <213<150 mg/dL   HDL 64 >08>40 mg/dL   Total CHOL/HDL Ratio 2.9 RATIO   VLDL 16 0 - 40 mg/dL   LDL Cholesterol 657108 (H) 0 - 99 mg/dL  Troponin I     Status: Abnormal   Collection Time: 06/15/16  2:16 PM  Result Value Ref Range   Troponin I 0.21 (HH) <0.03 ng/mL   Dg Chest 2 View  Result Date: 06/13/2016 CLINICAL DATA:  Central chest pain radiating to the jaw for 2-3 days EXAM: CHEST  2 VIEW COMPARISON:  07/01/2014 CXR FINDINGS: The heart size and mediastinal contours are within normal limits. Both lungs are clear. The visualized skeletal structures are without acute abnormality. IMPRESSION: No active cardiopulmonary disease. Electronically Signed   By: Tollie Ethavid  Kwon M.D.   On: 06/13/2016 19:28   Ct Angio Chest Pe W Or Wo Contrast  Result Date: 06/14/2016 CLINICAL DATA:  New onset of hypoxia and chest pain at 4:15 today. Known upper extremity DVT. The patient is on heparin. EXAM: CT ANGIOGRAPHY CHEST WITH CONTRAST TECHNIQUE: Multidetector CT imaging of the chest was performed using the standard protocol during bolus administration of intravenous contrast. Multiplanar CT image reconstructions and MIPs were obtained to evaluate the vascular anatomy. CONTRAST:  75 mL Isovue 370 COMPARISON:  CTA of the chest 06/13/2016. FINDINGS: Cardiovascular: The heart size is normal. No significant pleural or pericardial effusion is present. Pulmonary artery use fill normally on both sides. There are no focal filling defects to suggest pulmonary emboli. Mediastinum/Nodes: No significant mediastinal or axillary adenopathy is present. Lungs/Pleura: Mild dependent atelectasis is present at the lung bases bilaterally. Small bilateral pleural effusions are noted. Upper Abdomen: Limited imaging of the upper abdomen is unremarkable. Musculoskeletal:  Mild rightward curvature is present in the mid thoracic spine. Vertebral body heights and alignment are maintained. No focal lytic or blastic lesions are present. Review of the MIP images confirms the above findings. IMPRESSION: 1. No evidence for  pulmonary embolus. 2. Mild dependent atelectasis and small effusions bilaterally are new. Electronically Signed   By: Marin Robertshristopher  Mattern M.D.   On: 06/14/2016 17:46   [place] Venous Img Upper Uni Left  Result Date: 06/14/2016 CLINICAL DATA:  Upper extremity swelling EXAM: Left UPPER EXTREMITY VENOUS DOPPLER ULTRASOUND TECHNIQUE: Gray-scale sonography with graded compression, as well as color Doppler and duplex ultrasound were performed to evaluate the upper extremity deep venous system from the level of the subclavian vein and including the jugular, axillary, basilic, radial, ulnar and upper cephalic vein. Spectral Doppler was utilized to evaluate flow at rest and with distal augmentation maneuvers. COMPARISON:  CT 06/13/2016 FINDINGS: Contralateral Subclavian Vein: Respiratory phasicity is normal and symmetric with the symptomatic side. No evidence of thrombus. Normal compressibility. Internal Jugular Vein: Expanded with echogenic occlusive thrombus. Subclavian Vein: Not compressible. Nonocclusive thrombus present within the subclavian vein. Axillary Vein: No evidence of thrombus. Normal compressibility, respiratory phasicity and response to augmentation. Cephalic Vein: No evidence of thrombus. Normal compressibility, respiratory phasicity and response to augmentation. Basilic Vein: No evidence of thrombus. Normal compressibility, respiratory phasicity and response to augmentation. Brachial Veins: No evidence of thrombus. Normal compressibility, respiratory phasicity and response to augmentation. Radial Veins: No evidence of thrombus. Normal compressibility, respiratory phasicity and response to augmentation. Ulnar Veins: No evidence of thrombus. Normal compressibility,  respiratory phasicity and response to augmentation. IMPRESSION: Acute occlusive thrombus within the left internal jugular vein. Acute nonocclusive thrombus within the left subclavian vein as well. Remaining venous structures of the left upper extremity are otherwise patent. Electronically Signed   By: Jasmine PangKim  Fujinaga M.D.   On: 06/14/2016 03:14   Ct Angio Chest Aorta W And/or Wo Contrast  Result Date: 06/13/2016 CLINICAL DATA:  Chest pain radiating to jaw for 3 days. Shortness of breath, dizziness, and lightheadedness. Clinical suspicion for thoracic aortic dissection. EXAM: CT ANGIOGRAPHY CHEST WITH CONTRAST TECHNIQUE: Multidetector CT imaging of the chest was performed using the standard protocol during bolus administration of intravenous contrast. Multiplanar CT image reconstructions and MIPs were obtained to evaluate the vascular anatomy. CONTRAST:  100 mL Isovue 370 COMPARISON:  None. FINDINGS: Cardiovascular: No evidence of thoracic aortic aneurysm or dissection. No evidence of mediastinal hematoma. No central pulmonary emboli identified. Normal heart size. No evidence of pericardial effusion. Mediastinum/Nodes: No soft tissue masses, lymphadenopathy, or mediastinal hematoma identified. Hazy density is seen in anterior mediastinum surrounding the left innominate vein, and tracking peripherally along the subclavian and axillary veins. This is nonspecific but may be due to upper extremity DVT/thrombophlebitis. Lungs/Pleura: No pulmonary mass, infiltrate, or effusion. Upper abdomen: No acute findings. Several tiny calcified gallstones noted, without evidence of cholecystitis. Musculoskeletal: No suspicious bone lesions or other significant abnormality identified. IMPRESSION: No evidence of thoracic aortic aneurysm or dissection. Hazy density in anterior mediastinum surrounding left innominate vein, and tracking peripherally along the left subclavian and axillary veins. This is nonspecific, but may be due to  upper extremity DVT/thrombophlebitis. Recommend upper extremity venous Doppler ultrasound for further evaluation. No active lung disease. Cholelithiasis.  No radiographic evidence of cholecystitis. Electronically Signed   By: Myles RosenthalJohn  Stahl M.D.   On: 06/13/2016 22:16     ASSESSMENT AND PLAN: Hypertensive crisis with uncontrolled hypertension and mildly elevated troponin and atypical chest pain with unremarkable EKG. Echocardiogram shows normal wall motion and normal EF. Mildly elevated troponin due to demand ischemia and can have outpatient stress test done. Advise adding hydralazine to control blood pressure.  Lennette Fader A

## 2016-06-15 NOTE — Progress Notes (Signed)
*  PRELIMINARY RESULTS* Echocardiogram 2D Echocardiogram has been performed.  Ricky Leonard 06/15/2016, 3:36 PM

## 2016-06-15 NOTE — Progress Notes (Signed)
Spoke with MD re patient troponin being elevated at 0.22, tried to stop Cardene gtt but restarted when BP trended back up this afternoon. Pt c/o of chest pain times once and was medicated with Norco that was effective. Told MD awaiting ECHO, and MD will order Cardiology consult as well.

## 2016-06-15 NOTE — Progress Notes (Signed)
Pt medicated with one Norco again this afternoon for CP, this time not 7/10 but 4/10. Cardiologist came by to see pt. ECHO showed EF of 65%. Blood pressure still uncontrolled. Had to turn Cardene gtt up from 3 to 5.5 mg/hr. Heparin gtt stopped this AM and started on po Eliquis. Also started on lisinopril and Lipitor today. Has an occassional nonproductive cough. Family at bedside most of day doing simple ADLs for pt, pt reports feeling fatigued. Wearing nicotine patch, does not know if he wants to stop smoking.

## 2016-06-16 LAB — GLUCOSE, CAPILLARY: GLUCOSE-CAPILLARY: 104 mg/dL — AB (ref 65–99)

## 2016-06-16 MED ORDER — HYDROCHLOROTHIAZIDE 25 MG PO TABS: 25.0000 mg | ORAL_TABLET | Freq: Every day | ORAL | 2 refills | Status: DC

## 2016-06-16 MED ORDER — HYDRALAZINE HCL 20 MG/ML IJ SOLN: 10.0000 mg | INTRAMUSCULAR | Status: DC | PRN

## 2016-06-16 MED ORDER — APIXABAN 5 MG PO TABS
5.0000 mg | ORAL_TABLET | Freq: Two times a day (BID) | ORAL | 2 refills | Status: DC
Start: 2016-06-22 — End: 2016-07-25

## 2016-06-16 MED ORDER — LISINOPRIL 10 MG PO TABS: 10.0000 mg | ORAL_TABLET | Freq: Every day | ORAL | 2 refills | Status: DC

## 2016-06-16 MED ORDER — CARVEDILOL 12.5 MG PO TABS: 12.5000 mg | ORAL_TABLET | Freq: Two times a day (BID) | ORAL | 2 refills | Status: AC

## 2016-06-16 MED ORDER — APIXABAN 5 MG PO TABS: 10.0000 mg | ORAL_TABLET | Freq: Two times a day (BID) | ORAL | 0 refills | Status: DC

## 2016-06-16 NOTE — Discharge Instructions (Signed)
Heart healthy diet. °Smoking cessation. °

## 2016-06-16 NOTE — Care Management Note (Signed)
Case Management Note  Patient Details  Name: Ricky Leonard MRN: [id number] Date of Birth: [birth year]  Subjective/Objective: Met with patient at bedside. He lives at home with spouse and is independent, active and drives. He is not working. No PCP or health insurance. Application given for medication management and [facility]. Referral sent to both agencies for follow up. Free 30 coupon given for Eliquis. No other concerns identified.                  Action/Plan:   Expected Discharge Date:    06/16/2016             Expected Discharge Plan:  Home/Self Care  In-House Referral:     Discharge planning Services  CM Consult, Medication Assistance  Post Acute Care Choice:    Choice offered to:     DME Arranged:    DME Agency:     HH Arranged:    HH Agency:     Status of Service:  Completed, signed off  If discussed at [employer] of Stay Meetings, dates discussed:    Additional Comments:  Jolly Mango, RN 06/16/2016, 10:59 AM

## 2016-06-16 NOTE — Progress Notes (Signed)
SUBJECTIVE: Feeling much better   Vitals:   06/16/16 0300 06/16/16 0330 06/16/16 0359 06/16/16 0742  BP: (!) 151/84 (!) 148/95 (!) 153/92 (!) 151/96  Pulse: 70 70 71 69  Resp: 17 18 18 18   Temp:   99 F (37.2 C) 98.4 F (36.9 C)  TempSrc:   Oral Oral  SpO2: 97% 95% 94% 97%  Weight:      Height:        Intake/Output Summary (Last 24 hours) at 06/16/16 0841 Last data filed at 06/16/16 0200  Gross per 24 hour  Intake           702.57 ml  Output              400 ml  Net           302.57 ml    LABS: Basic Metabolic Panel:  Recent Labs  16/04/9611/02/17 0515 06/15/16 0758  NA 137 133*  K 3.4* 3.7  CL 103 102  CO2 26 25  GLUCOSE 115* 108*  BUN 13 12  CREATININE 1.14 1.03  CALCIUM 9.4 9.2  MG  --  1.9   Liver Function Tests: No results for input(s): AST, ALT, ALKPHOS, BILITOT, PROT, ALBUMIN in the last 72 hours. No results for input(s): LIPASE, AMYLASE in the last 72 hours. CBC:  Recent Labs  06/14/16 0515 06/15/16 0758  WBC 8.6 10.7*  HGB 15.0 14.7  HCT 42.4 42.2  MCV 91.6 93.1  PLT 199 208   Cardiac Enzymes:  Recent Labs  06/13/16 1851 06/15/16 0758 06/15/16 1416  TROPONINI <0.03 0.22* 0.21*   BNP: Invalid input(s): POCBNP D-Dimer: No results for input(s): DDIMER in the last 72 hours. Hemoglobin A1C: No results for input(s): HGBA1C in the last 72 hours. Fasting Lipid Panel:  Recent Labs  06/15/16 0758  CHOL 188  HDL 64  LDLCALC 108*  TRIG 80  CHOLHDL 2.9   Thyroid Function Tests: No results for input(s): TSH, T4TOTAL, T3FREE, THYROIDAB in the last 72 hours.  Invalid input(s): FREET3 Anemia Panel: No results for input(s): VITAMINB12, FOLATE, FERRITIN, TIBC, IRON, RETICCTPCT in the last 72 hours.   PHYSICAL EXAM General: Well developed, well nourished, in no acute distress HEENT:  Normocephalic and atramatic Neck:  No JVD.  Lungs: Clear bilaterally to auscultation and percussion. Heart: HRRR . Normal S1 and S2 without gallops or murmurs.   Abdomen: Bowel sounds are positive, abdomen soft and non-tender  Msk:  Back normal, normal gait. Normal strength and tone for age. Extremities: No clubbing, cyanosis or edema.   Neuro: Alert and oriented X 3. Psych:  Good affect, responds appropriately  TELEMETRY:Sinus rhythm  ASSESSMENT AND PLAN: Uncontrolled hypertension, agree with starting the patient on hydralazine as blood pressure is still elevated.  Principal Problem:   Accelerated hypertension Active Problems:   Chest pain   Left arm swelling    Ricky Leonard A, MD, [facility] 06/16/2016 8:41 AM

## 2016-06-16 NOTE — Plan of Care (Signed)
Problem: Bowel/Gastric: Goal: Will not experience complications related to bowel motility Outcome: Completed/Met Date Met: 06/16/16 Pt has met all goals for discharge.

## 2016-06-16 NOTE — Progress Notes (Signed)
Cardene drip titrated off at 0200.  Pt's BP stable at this time.  PRN hydralizine orders in place for SBP>160.  Patient is A&Ox4, VSS, SR on cardiac monitor with no complaints of pain.  Pt will be transferring to med-surg unit.  Report called to Jacki ConesLaurie, Charity fundraiserN.

## 2016-06-16 NOTE — Progress Notes (Signed)
Pt has had iv sites removed by Lemont FillersKellie Hubachek,RN, and was given d/c instructions as well. Pt dc'd via [facility] to entrance of facility at 1230 and to waiting family car.Pt in no distress, and in agreement with the discharge.

## 2016-06-16 NOTE — Progress Notes (Signed)
Shift assessment completed, cardiologist rounded as well. Pt is alert and oriented, denied headache, denied chest pain, denied sob. Lungs are clear bilat, pt is on room air. Hr is regular, pt has telebox on with Sr noted. Abdomen is soft, bsh eard. Pt denied difficulty voiding, ppp, no edema noted to bilat le's, LUA has an abrasion to outer Ac area, some edema, pt denied pain. Pt would like to be dc'd today. Srx2, call bell in reach.

## 2016-06-16 NOTE — Discharge Summary (Signed)
[facility] - [place] at [facility]   PATIENT NAME: Ricky FretShaun Uehara    MR#:  [id number]  DATE OF BIRTH:  1981/04/25  DATE OF ADMISSION:  06/13/2016   ADMITTING PHYSICIAN: Oralia Manisavid Willis, MD  DATE OF DISCHARGE: 06/16/2016 12:24 PM  PRIMARY CARE PHYSICIAN: No PCP Per Patient   ADMISSION DIAGNOSIS:  Swelling [R60.9] Hypertensive emergency without congestive heart failure [I16.1] DISCHARGE DIAGNOSIS:  Principal Problem:   Accelerated hypertension Active Problems:   Chest pain   Left arm swelling Hypertension, malignant. 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 Acute respiratory failure with hypoxia. Chest pain with elevated troponin. Due to hypertension malignancy. Hyperlipidemia. SECONDARY DIAGNOSIS:   Past Medical History:  Diagnosis Date  . Hypertension    HOSPITAL COURSE:  Hypertension, malignant. Off Nicardipine drip last night.  started coreg and HCTZ and lisinopril.  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 CTA chest showed no significant abnormalities except for possible thrombosis in his left upper extremity vessels.  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 He was on heparin drip, changed to to Eliquis. Thrombosis workup per hematologist.  Follow-up vascular surgeon as outpatient.  Acute respiratory failure with hypoxia. The patient was hypoxic at 87% in room air, in addition, he complains of chest pain. Repeat CTA PE protocol did not show PE. Started oxygen [place] 6 L to keep SAT to 95%, then on 2 L. Off O2.  Chest pain with elevated troponin. Due to hypertension malignancy. HLP.  Abnormal EKG with T-wave abnormality and prolonged QT.  Change to Eliquis, follow-up troponin, lipid panel: LDL 108, echocardiogram: Normal study.  continue coreg, lisinopril and follow-up Dr. Welton FlakesKhan as outpatient.   Hypokalemia. Improved with potassium supplement.  Tobacco abuse. Smoking cessation  was counseled.  DISCHARGE CONDITIONS:  Stable, discharged to home today. CONSULTS OBTAINED:  Treatment Team:  Rosey BathMelissa C Corcoran, MD Tonette LedererKimberly A Stegmayer, PA-C Annice NeedyJason S Dew, MD Laurier NancyShaukat A Khan, MD DRUG ALLERGIES:  No Known Allergies DISCHARGE MEDICATIONS:     Medication List    TAKE these medications   apixaban 5 MG Tabs tablet Commonly known as:  ELIQUIS Take 2 tablets (10 mg total) by mouth 2 (two) times daily. Followed by 5 mg po bid.   apixaban 5 MG Tabs tablet Commonly known as:  ELIQUIS Take 1 tablet (5 mg total) by mouth 2 (two) times daily. Start taking on:  06/22/2016   carvedilol 12.5 MG tablet Commonly known as:  COREG Take 1 tablet (12.5 mg total) by mouth 2 (two) times daily with a meal.   hydrochlorothiazide 25 MG tablet Commonly known as:  HYDRODIURIL Take 1 tablet (25 mg total) by mouth daily. Start taking on:  06/17/2016   lisinopril 10 MG tablet Commonly known as:  PRINIVIL,ZESTRIL Take 1 tablet (10 mg total) by mouth daily. Start taking on:  06/17/2016        DISCHARGE INSTRUCTIONS:  See AVS.  If you experience worsening of your admission symptoms, develop shortness of breath, life threatening emergency, suicidal or homicidal thoughts you must seek medical attention immediately by calling 911 or calling your MD immediately  if symptoms less severe.  You Must read complete instructions/literature along with all the possible adverse reactions/side effects for all the Medicines you take and that have been prescribed to you. Take any new Medicines after you have completely understood and accpet all the possible adverse reactions/side effects.   Please note  You were cared for by a  hospitalist during your hospital stay. If you have any questions about your discharge medications or the care you received while you were in the hospital after you are discharged, you can call the unit and asked to speak with the hospitalist on call if the hospitalist that  took care of you is not available. Once you are discharged, your primary care physician will handle any further medical issues. Please note that NO REFILLS for any discharge medications will be authorized once you are discharged, as it is imperative that you return to your primary care physician (or establish a relationship with a primary care physician if you do not have one) for your aftercare needs so that they can reassess your need for medications and monitor your lab values.    On the day of Discharge:  VITAL SIGNS:  Blood pressure (!) 151/96, pulse 69, temperature 98.4 F (36.9 C), temperature source Oral, resp. rate 18, height 5\' 10"  (1.778 m), weight 250 lb (113.4 kg), SpO2 97 %. PHYSICAL EXAMINATION:  GENERAL:  35 y.o.-year-old patient lying in the bed with no acute distress. Obese.  EYES: Pupils equal, round, reactive to light and accommodation. No scleral icterus. Extraocular muscles intact.  HEENT: Head atraumatic, normocephalic. Oropharynx and nasopharynx clear.  NECK:  Supple, no jugular venous distention. No thyroid enlargement, no tenderness.  LUNGS: Normal breath sounds bilaterally, no wheezing, rales,rhonchi or crepitation. No use of accessory muscles of respiration.  CARDIOVASCULAR: S1, S2 normal. No murmurs, rubs, or gallops.  ABDOMEN: Soft, non-tender, non-distended. Bowel sounds present. No organomegaly or mass.  EXTREMITIES: No pedal edema, cyanosis, or clubbing.  NEUROLOGIC: Cranial nerves II through XII are intact. Muscle strength 5/5 in all extremities. Sensation intact. Gait not checked.  PSYCHIATRIC: The patient is alert and oriented x 3.  SKIN: No obvious rash, lesion, or ulcer.  DATA REVIEW:   CBC  Recent Labs Lab 06/15/16 0758  WBC 10.7*  HGB 14.7  HCT 42.2  PLT 208    Chemistries   Recent Labs Lab 06/15/16 0758  NA 133*  K 3.7  CL 102  CO2 25  GLUCOSE 108*  BUN 12  CREATININE 1.03  CALCIUM 9.2  MG 1.9     Microbiology Results    Results for orders placed or performed during the hospital encounter of 06/13/16  MRSA PCR Screening     Status: None   Collection Time: 06/14/16  1:58 AM  Result Value Ref Range Status   MRSA by PCR NEGATIVE NEGATIVE Final    Comment:        The GeneXpert MRSA Assay (FDA approved for NASAL specimens only), is one component of a comprehensive MRSA colonization surveillance program. It is not intended to diagnose MRSA infection nor to guide or monitor treatment for MRSA infections.     RADIOLOGY:  No results found.   Management plans discussed with the patient, family and they are in agreement.  CODE STATUS:  Code Status History    Date Active Date Inactive Code Status Order ID Comments User Context   06/14/2016  1:52 AM 06/16/2016  3:30 PM Full Code [id number]  Oralia Manisavid Willis, MD ED      TOTAL TIME TAKING CARE OF THIS PATIENT: 36 minutes.    Shaune Pollackhen, [facility] Riches M.D on 06/16/2016 at 3:43 PM  Between 7am to 6pm - Pager - [phone]  After 6pm go to [email or web address] - Scientist, research (life [email or web address])password [email or web address]  [facility] [place] [facility]  Office  [phone]  CC: Primary care physician; No PCP Per Patient  Note: This dictation was prepared with Dragon dictation along with smaller phrase technology. Any transcriptional errors that result from this process are unintentional.

## 2016-06-17 LAB — CARDIOLIPIN ANTIBODIES, IGG, IGM, IGA
Anticardiolipin IgA: <9 APL U/mL (ref 0–11)
Anticardiolipin IgG: <9 GPL U/mL (ref 0–14)
Anticardiolipin IgM: <9 MPL U/mL (ref 0–12)

## 2016-06-17 LAB — BETA-2-GLYCOPROTEIN I ABS, IGG/M/A
Beta-2 Glyco I IgG: <9 GPI IgG units (ref 0–20)
Beta-2-Glycoprotein I IgA: <9 GPI IgA units (ref 0–25)
Beta-2-Glycoprotein I IgM: <9 GPI IgM units (ref 0–32)

## 2016-06-18 LAB — FACTOR 5 LEIDEN

## 2016-06-18 LAB — PROTHROMBIN GENE MUTATION

## 2016-06-19 LAB — LUPUS ANTICOAGULANT PANEL
DRVVT: 38 s (ref 0.0–47.0)
PTT Lupus Anticoagulant: 36.7 s (ref 0.0–51.9)

## 2016-06-30 ENCOUNTER — Ambulatory Visit: Payer: Self-pay | Admitting: Pharmacy Technician

## 2016-06-30 DIAGNOSIS — Z79899 Other long term (current) drug therapy: Secondary | ICD-10-CM

## 2016-06-30 NOTE — Progress Notes (Signed)
Met with patient completed financial assistance application for [place] due to recent hospital visit.  Patient agreed to be responsible for gathering financial information and forwarding to appropriate department in [place].    Completed [facility] application and contract.  Patient agreed to all terms of the [facility] contract.  Patient to provide proof of residency, last 30 days of pay stubs and 2016 taxes.  Provided patient with community resource material based on his particular needs.    Eliquis Prescription Application completed with patient.  Provider portion will be sent to Dr. Shaukat Khan once proof of income is provided.  Upon receipt of signed application from provider and proof of income from patient, Eliquis Prescription Application will be submitted to [place]..  Betty J. Kluttz Care Manager [facility]  

## 2016-07-01 ENCOUNTER — Ambulatory Visit (INDEPENDENT_AMBULATORY_CARE_PROVIDER_SITE_OTHER): Payer: Medicaid Other | Admitting: Vascular Surgery

## 2016-07-01 ENCOUNTER — Encounter (INDEPENDENT_AMBULATORY_CARE_PROVIDER_SITE_OTHER): Payer: Self-pay | Admitting: Vascular Surgery

## 2016-07-01 VITALS — BP 149/93 | HR 58 | Resp 17 | Ht 70.5 in | Wt 245.6 lb

## 2016-07-01 DIAGNOSIS — I10 Essential (primary) hypertension: Secondary | ICD-10-CM

## 2016-07-01 DIAGNOSIS — I82890 Acute embolism and thrombosis of other specified veins: Secondary | ICD-10-CM | POA: Diagnosis not present

## 2016-07-01 DIAGNOSIS — F17200 Nicotine dependence, unspecified, uncomplicated: Secondary | ICD-10-CM

## 2016-07-01 NOTE — Assessment & Plan Note (Signed)
blood pressure control important in reducing the progression of atherosclerotic disease. On appropriate oral medications.  

## 2016-07-01 NOTE — Assessment & Plan Note (Signed)
We had a discussion for approximately 3 minutes regarding the absolute need for smoking cessation due to the deleterious nature of tobacco on the vascular system. Continued smoking will likely increase his risk of future thrombotic issues. We discussed the tobacco use would diminish patency of any intervention, and likely significantly worsen progression of disease. We discussed multiple agents for quitting including replacement therapy or medications to reduce cravings such as Chantix. The patient voices their understanding of the importance of smoking cessation.

## 2016-07-01 NOTE — Assessment & Plan Note (Signed)
The patient has a left jugular vein thrombosis and has had a good symptomatic relief to initiation of anticoagulation. He should continue his anticoagulation for 2-3 months. I would then repeat his duplex and evaluate him for cessation of anticoagulation. Smoking cessation recommended. He is urged contact our office if he has any bleeding issues in the interim.

## 2016-07-01 NOTE — Progress Notes (Signed)
MRN : [id number]  Ricky Leonard is a 35 y.o. ([birth year]) male who presents with chief complaint of  Chief Complaint  Patient presents with  . New Patient (Initial Visit)  .  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 He was seen in the hospital about 2 weeks ago as a consult for left neck and arm swelling. He was found to have a jugular vein thrombosis. He was started on anticoagulation in the form of Eliquis. This has resulted in essentially resolution of the pain and swelling in his neck and arm. He has had one brief episode of jaw pain since discharge, but this was transient. He has had no bleeding complications.  Current Outpatient Prescriptions  Medication Sig Dispense Refill  . apixaban (ELIQUIS) 5 MG TABS tablet Take 2 tablets (10 mg total) by mouth 2 (two) times daily. Followed by 5 mg po bid. 24 tablet 0  . apixaban (ELIQUIS) 5 MG TABS tablet Take 1 tablet (5 mg total) by mouth 2 (two) times daily. 60 tablet 2  . carvedilol (COREG) 12.5 MG tablet Take 1 tablet (12.5 mg total) by mouth 2 (two) times daily with a meal. 60 tablet 2  . hydrochlorothiazide (HYDRODIURIL) 25 MG tablet Take 1 tablet (25 mg total) by mouth daily. 30 tablet 2  . lisinopril (PRINIVIL,ZESTRIL) 10 MG tablet Take 1 tablet (10 mg total) by mouth daily. 30 tablet 2   No current facility-administered medications for this visit.     Past Medical History:  Diagnosis Date  . DVT (deep venous thrombosis) ([place])   . Hypertension     Past Surgical History:  Procedure Laterality Date  . NO PAST SURGERIES      Social History Social History  Substance Use Topics  . Smoking status: Current Every Day Smoker    Packs/day: 0.50    Types: Cigarettes  . Smokeless tobacco: Never Used  . Alcohol use Yes     Family History Family History  Problem Relation Age of Onset  . Hypertension Other   . Diabetes Maternal Grandmother      No Known  Allergies   REVIEW OF SYSTEMS (Negative unless checked)  Constitutional: _0 Weight loss  _1 Fever  _2 Chills Cardiac: _3 Chest pain   _4 Chest pressure   _5 Palpitations   _6 Shortness of breath when laying flat   _7 Shortness of breath at rest   _8 Shortness of breath with exertion. Vascular:  _9 Pain in legs with walking   _10 Pain in legs at rest   _11 Pain in legs when laying flat   _12 Claudication   _13 Pain in feet when walking  _14 Pain in feet at rest  _15 Pain in feet when laying flat   _16 History of DVT   _17 Phlebitis   _18 Swelling in legs   _19 Varicose veins   _20 Non-healing ulcers Pulmonary:   _21 Uses home oxygen   _22 Productive cough   _23 Hemoptysis   _24 Wheeze  _25 COPD   _26 Asthma Neurologic:  _27 Dizziness  _28 Blackouts   _29 Seizures   _30 History of stroke   _31 History of TIA  _32 Aphasia   _33 Temporary blindness   _34 Dysphagia   _35 Weakness or numbness in arms   _36 Weakness or numbness in legs Musculoskeletal:  _37 Arthritis   _38 Joint swelling   _39 Joint pain   _40 Low back pain Hematologic:  _41 Easy bruising  _42 Easy bleeding   _43 Hypercoagulable state   _44 Anemic   Gastrointestinal:  _45 Blood in stool   _46 Vomiting blood  _47 Gastroesophageal reflux/heartburn   _48 Abdominal pain Genitourinary:  _49   Chronic kidney disease   _0 Difficult urination  _1 Frequent urination  _2 Burning with urination   _3 Hematuria Skin:  _4 Rashes   _5 Ulcers   _6 Wounds Psychological:  _7 History of anxiety   _8  History of major depression.  Physical Examination  BP (!) 149/93   Pulse (!) 58   Resp 17   Ht 5' 10.5" (1.791 m)   Wt 245 lb 9.6 oz (111.4 kg)   BMI 34.74 kg/m  Gen:  WD/WN, NAD Head: [place]/AT, No temporalis wasting. Ear/Nose/Throat: Hearing grossly intact, nares w/o erythema or drainage, trachea midline.  Eyes: Conjunctiva clear. Sclera non-icteric Neck: No appreciable neck swelling on exam today.  No JVD.  Pulmonary:  Good air movement, no use of accessory muscles.  Cardiac: RRR, normal S1, S2 Vascular:  Vessel Right Left  Radial Palpable  Palpable                                   Gastrointestinal: soft, non-tender/non-distended. No guarding/reflex.  Musculoskeletal: M/S 5/5 throughout.  No deformity or atrophy. No appreciable arm or neck swelling on exam today Neurologic: Sensation grossly intact in extremities.  Symmetrical.  Speech is fluent.  Psychiatric: Judgment intact, Mood & affect appropriate for pt's clinical situation. Dermatologic: No rashes or ulcers noted.  No cellulitis or open wounds. Lymph : No Cervical, Axillary, or Inguinal lymphadenopathy.      Labs Recent Results (from the past 2160 hour(s))  Basic metabolic panel     Status: Abnormal   Collection Time: 06/13/16  6:51 PM  Result Value Ref Range   Sodium 137 135 - 145 mmol/L   Potassium 3.5 3.5 - 5.1 mmol/L   Chloride 104 101 - 111 mmol/L   CO2 26 22 - 32 mmol/L   Glucose, Bld 83 65 - 99 mg/dL   BUN 18 6 - 20 mg/dL   Creatinine, Ser 1.27 (H) 0.61 - 1.24 mg/dL   Calcium 9.6 8.9 - 10.3 mg/dL   GFR calc non Af Amer >60 >60 mL/min   GFR calc Af Amer >60 >60 mL/min    Comment: (NOTE) The eGFR has been calculated using the CKD EPI equation. This calculation has not been validated in all clinical situations. eGFR's persistently <60 mL/min signify possible Chronic Kidney Disease.    Anion gap 7 5 - 15  CBC     Status: None   Collection Time: 06/13/16  6:51 PM  Result Value Ref Range   WBC 8.5 3.8 - 10.6 K/uL   RBC 4.73 4.40 - 5.90 MIL/uL   Hemoglobin 15.2 13.0 - 18.0 g/dL   HCT 44.3 40.0 - 52.0 %   MCV 93.7 80.0 - 100.0 fL   MCH 32.1 26.0 - 34.0 pg   MCHC 34.3 32.0 - 36.0 g/dL   RDW 13.4 11.5 - 14.5 %   Platelets 197 150 - 440 K/uL  Troponin I     Status: None   Collection Time: 06/13/16  6:51 PM  Result Value Ref Range   Troponin I <0.03 <0.03 ng/mL  Urine Drug Screen, Qualitative ([facility] only)     Status: Abnormal   Collection Time: 06/14/16 12:12 AM  Result Value Ref Range   Tricyclic, Ur Screen NONE DETECTED NONE DETECTED    Amphetamines, Ur Screen NONE DETECTED NONE DETECTED   MDMA (Ecstasy)Ur Screen NONE DETECTED NONE DETECTED   Cocaine Metabolite,Ur  NONE DETECTED NONE DETECTED   Opiate, Ur Screen NONE DETECTED NONE DETECTED  Phencyclidine (PCP) Ur S NONE DETECTED NONE DETECTED   Cannabinoid 50 Ng, Ur [place] POSITIVE (A) NONE DETECTED   Barbiturates, Ur Screen NONE DETECTED NONE DETECTED   Benzodiazepine, Ur Scrn NONE DETECTED NONE DETECTED   Methadone Scn, Ur NONE DETECTED NONE DETECTED    Comment: (NOTE) 161  Tricyclics, urine               Cutoff 1000 ng/mL 200  Amphetamines, urine             Cutoff 1000 ng/mL 300  MDMA (Ecstasy), urine           Cutoff 500 ng/mL 400  Cocaine Metabolite, urine       Cutoff 300 ng/mL 500  Opiate, urine                   Cutoff 300 ng/mL 600  Phencyclidine (PCP), urine      Cutoff 25 ng/mL 700  Cannabinoid, urine              Cutoff 50 ng/mL 800  Barbiturates, urine             Cutoff 200 ng/mL 900  Benzodiazepine, urine           Cutoff 200 ng/mL 1000 Methadone, urine                Cutoff 300 ng/mL 1100 1200 The urine drug screen provides only a preliminary, unconfirmed 1300 analytical test result and should not be used for non-medical 1400 purposes. Clinical consideration and professional judgment should 1500 be applied to any positive drug screen result due to possible 1600 interfering substances. A more specific alternate chemical method 1700 must be used in order to obtain a confirmed analytical result.  1800 Gas chromato graphy / mass spectrometry (GC/MS) is the preferred 1900 confirmatory method.   Glucose, capillary     Status: Abnormal   Collection Time: 06/14/16  1:45 AM  Result Value Ref Range   Glucose-Capillary 104 (H) 65 - 99 mg/dL  MRSA PCR Screening     Status: None   Collection Time: 06/14/16  1:58 AM  Result Value Ref Range   MRSA by PCR NEGATIVE NEGATIVE    Comment:        The GeneXpert MRSA Assay (FDA approved for NASAL specimens only),  is one component of a comprehensive MRSA colonization surveillance program. It is not intended to diagnose MRSA infection nor to guide or monitor treatment for MRSA infections.   Basic metabolic panel     Status: Abnormal   Collection Time: 06/14/16  5:15 AM  Result Value Ref Range   Sodium 137 135 - 145 mmol/L   Potassium 3.4 (L) 3.5 - 5.1 mmol/L   Chloride 103 101 - 111 mmol/L   CO2 26 22 - 32 mmol/L   Glucose, Bld 115 (H) 65 - 99 mg/dL   BUN 13 6 - 20 mg/dL   Creatinine, Ser 1.14 0.61 - 1.24 mg/dL   Calcium 9.4 8.9 - 10.3 mg/dL   GFR calc non Af Amer >60 >60 mL/min   GFR calc Af Amer >60 >60 mL/min    Comment: (NOTE) The eGFR has been calculated using the CKD EPI equation. This calculation has not been validated in all clinical situations. eGFR's persistently <60 mL/min signify possible Chronic Kidney Disease.    Anion gap 8 5 - 15  CBC     Status: None   Collection Time: 06/14/16  5:15 AM  Result Value  Ref Range   WBC 8.6 3.8 - 10.6 K/uL   RBC 4.63 4.40 - 5.90 MIL/uL   Hemoglobin 15.0 13.0 - 18.0 g/dL   HCT 42.4 40.0 - 52.0 %   MCV 91.6 80.0 - 100.0 fL   MCH 32.4 26.0 - 34.0 pg   MCHC 35.4 32.0 - 36.0 g/dL   RDW 13.4 11.5 - 14.5 %   Platelets 199 150 - 440 K/uL  APTT     Status: Abnormal   Collection Time: 06/14/16  8:32 AM  Result Value Ref Range   aPTT 38 (H) 24 - 36 seconds    Comment:        IF BASELINE aPTT IS ELEVATED, SUGGEST PATIENT RISK ASSESSMENT BE USED TO DETERMINE APPROPRIATE ANTICOAGULANT THERAPY.   Protime-INR     Status: None   Collection Time: 06/14/16  8:32 AM  Result Value Ref Range   Prothrombin Time 13.5 11.4 - 15.2 seconds   INR 1.03   Heparin level (unfractionated)     Status: Abnormal   Collection Time: 06/14/16  2:45 PM  Result Value Ref Range   Heparin Unfractionated 1.38 (H) 0.30 - 0.70 IU/mL    Comment:        IF HEPARIN RESULTS ARE BELOW EXPECTED VALUES, AND PATIENT DOSAGE HAS BEEN CONFIRMED, SUGGEST FOLLOW UP TESTING OF  ANTITHROMBIN III LEVELS.   Factor 5 leiden     Status: None   Collection Time: 06/15/16  7:58 AM  Result Value Ref Range   Recommendations-F5LEID: Comment     Comment: (NOTE) Result:  Negative (no mutation found) Factor V Leiden is a specific mutation (R506Q) in the factor V gene that is associated with an increased risk of venous thrombosis. Factor V Leiden is more resistant to inactivation by activated protein C.  As a result, factor V persists in the circulation leading to a mild hyper- coagulable state.  The Leiden mutation accounts for 90% - 95% of APC resistance.  Factor V Leiden has been reported in patients with deep vein thrombosis, pulmonary embolus, central retinal vein occlusion, cerebral sinus thrombosis and hepatic vein thrombosis. Other risk factors to be considered in the workup for venous thrombosis include the G20210A mutation in the factor II (prothrombin) gene, protein S and C deficiency, and antithrombin deficiencies. Anticardiolipin antibody and lupus anticoagulant analysis may be appropriate for certain patients, as well as homocysteine levels. Contact your local [facility] for information on how to order additi onal testing if desired.    Comment Comment     Comment: (NOTE) **Genetic counselors are available for health care providers to**  discuss results at [phone] [phone]). Methodology: DNA analysis of the Factor V gene was performed by allele-specific PCR. The diagnostic sensitivity and specificity is >99% for both. Molecular-based testing is highly accurate, but as in any laboratory test, diagnostic errors may occur. All test results must be combined with clinical information for the most accurate interpretation. This test was developed and its performance characteristics determined by [facility]. It has not been cleared or approved by the Food and Drug Administration. References: Voelkerding K (1996).  Clin Lab Med [id number]. Allison Quarry,  PhD, [facility] Ruben Reason, PhD, [facility] Jens Som, PhD, [facility] Annetta Maw, M.S., PhD, [facility] Alfredo Bach, PhD, [facility] Norva Riffle, PhD, [facility] Earlean Polka PhD, [facility] Performed At: [facility] [place], [place] [id number] Nelda Marseille IO:[id number]   Prothrombin gene mutation     Status: Abnormal   Collection  Time: 06/15/16  7:58 AM  Result Value Ref Range   Recommendations-PTGENE: Comment (A)     Comment: (NOTE) SINGLE G-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 The incidence of this mutation in the [place] Caucasian population is about 2% and in the [place] American population it is approximately 0.5%. This mutation is rare in the [place] and Native American population. Being heterozygous for a prothrombin mutation increases the risk for developing venous thrombosis about 2 to 3 times above the general population risk. Being homozygous for the prothrombin gene mutation increases the relative risk for venous thrombosis further, although it is not yet known how much further the risk is increased. In women heterozygous for the prothrombin gene mutation, the use of estrogen containing oral contraceptives increases the relative risk of venous thrombosis about 16 times and the risk of developing cerebral thrombosis is also significantly increased. In  pregnancy the prothrombin gene mutation increases risk for venous thrombosis and may increase risk for stillbirth, placental abruption, pre-eclampsia and fetal growth restriction. If the patient possesses two or more congenital or acquired thrombophilic risk factors, the risk for thrombosis may rise to more than the sum of the risk ratios for the individual mutations. This assay detects only the prothrombin G20210A mutation and does not measure genetic  abnormalities elsewhere in the genome. Other thrombotic risk factors may be pursued through systematic clinical laboratory analysis. These factors include the R506Q (Leiden) mutation in the Factor V gene, plasma homocysteine levels, as well as testing for deficiencies of antithrombin III, protein C and protein S.    Additional Information Comment     Comment: (NOTE) Genetic Counselors are available for health care providers to discuss results at [phone] [phone]). Methodology: DNA analysis of the Factor II gene was performed by PCR amplification followed by restriction analysis. The diagnostic sensitivity is >99% for both. All the tests must be combined with clinical information for the most accurate interpretation. Molecular-based testing is highly accurate, but as in any laboratory test, diagnostic errors may occur. This test was developed and its performance characteristics determined by [facility]. It has not been cleared or approved by the Food and Drug Administration. Poort SR, et al. Blood. 1996; 94:4967-5916. Varga EA. Circulation. 2004; 384:Y65-L93. Mervin Hack, et [employer]; 19:700-703. Allison Quarry, PhD, [facility] Ruben Reason, PhD, [facility] Jens Som, PhD, [facility] Annetta Maw, M.S., PhD, [facility] Alfredo Bach, PhD, [facility] Norva Riffle, PhD, [facility] Earlean Polka,  PhD, [facility] Performed At: [facility] [place], [place] [id number] Nechama Guard MD [id number]:[id number]   Lupus anticoagulant panel     Status: None   Collection Time: 06/15/16  7:58 AM  Result Value Ref Range   PTT Lupus Anticoagulant 36.7 0.0 - 51.9 sec    Comment: (NOTE) Additional testing confirms the presence of heparin in the test sample. Results obtained after heparin neutralization.    DRVVT 38.0 0.0 - 47.0 sec   Lupus Anticoag Interp Comment:     Comment: (NOTE) No lupus anticoagulant was detected. Performed At: [facility] [place], [place] [id number] Lindon Romp MD GY:[id number]   Cardiolipin antibodies, IgG, IgM, IgA     Status: None   Collection Time: 06/15/16  7:58 AM  Result Value Ref Range   Anticardiolipin IgG <9 0 - 14 GPL U/mL    Comment: (NOTE)  Negative:              <15                          Indeterminate:     15 - 20                          Low-Med Positive: >20 - 80                          High Positive:         >80    Anticardiolipin IgM <9 0 - 12 MPL U/mL    Comment: (NOTE)                          Negative:              <13                          Indeterminate:     13 - 20                          Low-Med Positive: >20 - 80                          High Positive:         >80    Anticardiolipin IgA <9 0 - 11 APL U/mL    Comment: (NOTE)                          Negative:              <12                          Indeterminate:     12 - 20                          Low-Med Positive: >20 - 80                          High Positive:         >80 Performed At: [facility] [address] [place], [place] [id number] Lindon Romp MD [id number]   Beta-2-glycoprotein  labs, IgG/M/A     Status: None   Collection Time: 06/15/16  7:58 AM  Result Value Ref Range   Beta-2 Glyco I IgG <9 0 - 20 GPI IgG units    Comment: (NOTE) The reference interval reflects a 3SD or 99th percentile interval, which is thought to represent a potentially clinically significant result in accordance with the International Consensus Statement on the classification criteria for definitive antiphospholipid syndrome (APS). J Thromb Haem 2006;4:295-306.    Beta-2-Glycoprotein I IgM <9 0 - 32 GPI IgM units    Comment: (NOTE) The reference interval reflects a 3SD or 99th percentile interval, which is thought to represent a potentially clinically significant result in accordance with the International Consensus Statement on the classification  criteria for definitive antiphospholipid syndrome (APS). J Thromb Haem 2006;4:295-306. Performed At: [facility] [place], [place] [id number] Lindon Romp MD [id number]  Beta-2-Glycoprotein I IgA <9 0 - 25 GPI IgA units    Comment: (NOTE) The reference interval reflects a 3SD or 99th percentile interval, which is thought to represent a potentially clinically significant result in accordance with the International Consensus Statement on the classification criteria for definitive antiphospholipid syndrome (APS). J Thromb Haem 2006;4:295-306.   Basic metabolic panel     Status: Abnormal   Collection Time: 06/15/16  7:58 AM  Result Value Ref Range   Sodium 133 (L) 135 - 145 mmol/L   Potassium 3.7 3.5 - 5.1 mmol/L   Chloride 102 101 - 111 mmol/L   CO2 25 22 - 32 mmol/L   Glucose, Bld 108 (H) 65 - 99 mg/dL   BUN 12 6 - 20 mg/dL   Creatinine, Ser 1.03 0.61 - 1.24 mg/dL   Calcium 9.2 8.9 - 10.3 mg/dL   GFR calc non Af Amer >60 >60 mL/min   GFR calc Af Amer >60 >60 mL/min    Comment: (NOTE) The eGFR has been calculated using the CKD EPI equation. This calculation has not been validated in all clinical situations. eGFR's persistently <60 mL/min signify possible Chronic Kidney Disease.    Anion gap 6 5 - 15  Magnesium     Status: None   Collection Time: 06/15/16  7:58 AM  Result Value Ref Range   Magnesium 1.9 1.7 - 2.4 mg/dL  Heparin level (unfractionated)     Status: None   Collection Time: 06/15/16  7:58 AM  Result Value Ref Range   Heparin Unfractionated 0.46 0.30 - 0.70 IU/mL    Comment:        IF HEPARIN RESULTS ARE BELOW EXPECTED VALUES, AND PATIENT DOSAGE HAS BEEN CONFIRMED, SUGGEST FOLLOW UP TESTING OF ANTITHROMBIN III LEVELS.   CBC     Status: Abnormal   Collection Time: 06/15/16  7:58 AM  Result Value Ref Range   WBC 10.7 (H) 3.8 - 10.6 K/uL   RBC 4.53 4.40 - 5.90 MIL/uL   Hemoglobin 14.7 13.0 - 18.0 g/dL   HCT 42.2 40.0 - 52.0 %    MCV 93.1 80.0 - 100.0 fL   MCH 32.5 26.0 - 34.0 pg   MCHC 34.9 32.0 - 36.0 g/dL   RDW 13.6 11.5 - 14.5 %   Platelets 208 150 - 440 K/uL  Troponin I     Status: Abnormal   Collection Time: 06/15/16  7:58 AM  Result Value Ref Range   Troponin I 0.22 (HH) <0.03 ng/mL    Comment: CRITICAL RESULT CALLED TO, READ BACK BY AND VERIFIED WITH FELICIA PREUDHOMME ON 26/9/48 AT 0935 BY [facility]   Lipid panel     Status: Abnormal   Collection Time: 06/15/16  7:58 AM  Result Value Ref Range   Cholesterol 188 0 - 200 mg/dL   Triglycerides 80 <150 mg/dL   HDL 64 >40 mg/dL   Total CHOL/HDL Ratio 2.9 RATIO   VLDL 16 0 - 40 mg/dL   LDL Cholesterol 108 (H) 0 - 99 mg/dL    Comment:        Total Cholesterol/HDL:CHD Risk Coronary Heart Disease Risk Table                     Men   Women  1/2 Average Risk   3.4   3.3  Average Risk       5.0   4.4  2 X Average Risk   9.6   7.1  3 X Average Risk  23.4   11.0        Use the calculated Patient Ratio above and the CHD Risk Table to determine the patient's CHD Risk.        ATP III CLASSIFICATION (LDL):  <100     mg/dL   Optimal  100-129  mg/dL   Near or Above                    Optimal  130-159  mg/dL   Borderline  160-189  mg/dL   High  >190     mg/dL   Very High   Troponin I     Status: Abnormal   Collection Time: 06/15/16  2:16 PM  Result Value Ref Range   Troponin I 0.21 (HH) <0.03 ng/mL    Comment: CRITICAL VALUE NOTED. VALUE IS CONSISTENT WITH PREVIOUSLY REPORTED/CALLED VALUE [facility]   ECHOCARDIOGRAM COMPLETE     Status: None   Collection Time: 06/15/16  3:36 PM  Result Value Ref Range   Weight 4,000 oz   Height 70 in   BP 194/105 mmHg    Radiology Dg Chest 2 View  Result Date: 06/13/2016 CLINICAL DATA:  Central chest pain radiating to the jaw for 2-3 days EXAM: CHEST  2 VIEW COMPARISON:  07/01/2014 CXR FINDINGS: The heart size and mediastinal contours are within normal limits. Both lungs are clear. The visualized skeletal structures are  without acute abnormality. IMPRESSION: No active cardiopulmonary disease. Electronically Signed   By: Ashley Royalty M.D.   On: 06/13/2016 19:28   Ct Angio Chest Pe W Or Wo Contrast  Result Date: 06/14/2016 CLINICAL DATA:  New onset of hypoxia and chest pain at 4:15 today. Known upper extremity DVT. The patient is on heparin. EXAM: CT ANGIOGRAPHY CHEST WITH CONTRAST TECHNIQUE: Multidetector CT imaging of the chest was performed using the standard protocol during bolus administration of intravenous contrast. Multiplanar CT image reconstructions and MIPs were obtained to evaluate the vascular anatomy. CONTRAST:  75 mL Isovue 370 COMPARISON:  CTA of the chest 06/13/2016. FINDINGS: Cardiovascular: The heart size is normal. No significant pleural or pericardial effusion is present. Pulmonary artery use fill normally on both sides. There are no focal filling defects to suggest pulmonary emboli. Mediastinum/Nodes: No significant mediastinal or axillary adenopathy is present. Lungs/Pleura: Mild dependent atelectasis is present at the lung bases bilaterally. Small bilateral pleural effusions are noted. Upper Abdomen: Limited imaging of the upper abdomen is unremarkable. Musculoskeletal: Mild rightward curvature is present in the mid thoracic spine. Vertebral body heights and alignment are maintained. No focal lytic or blastic lesions are present. Review of the MIP images confirms the above findings. IMPRESSION: 1. No evidence for pulmonary embolus. 2. Mild dependent atelectasis and small effusions bilaterally are new. Electronically Signed   By: San Morelle M.D.   On: 06/14/2016 17:46   US Venous Img Upper Uni Left  Result Date: 06/14/2016 CLINICAL DATA:  Upper extremity swelling EXAM: Left UPPER EXTREMITY VENOUS DOPPLER ULTRASOUND TECHNIQUE: Gray-scale sonography with graded compression, as well as color Doppler and duplex ultrasound were performed to evaluate the upper extremity deep venous system from the  level of the subclavian vein and including the jugular, axillary, basilic, radial, ulnar and upper cephalic vein. Spectral Doppler was utilized to evaluate flow at rest and with distal augmentation maneuvers. COMPARISON:  CT 06/13/2016 FINDINGS: Contralateral Subclavian Vein: Respiratory phasicity is normal and symmetric with the symptomatic side. No evidence of thrombus. Normal compressibility. Internal Jugular Vein: Expanded with echogenic occlusive  thrombus. Subclavian Vein: Not compressible. Nonocclusive thrombus present within the subclavian vein. Axillary Vein: No evidence of thrombus. Normal compressibility, respiratory phasicity and response to augmentation. Cephalic Vein: No evidence of thrombus. Normal compressibility, respiratory phasicity and response to augmentation. Basilic Vein: No evidence of thrombus. Normal compressibility, respiratory phasicity and response to augmentation. Brachial Veins: No evidence of thrombus. Normal compressibility, respiratory phasicity and response to augmentation. Radial Veins: No evidence of thrombus. Normal compressibility, respiratory phasicity and response to augmentation. Ulnar Veins: No evidence of thrombus. Normal compressibility, respiratory phasicity and response to augmentation. IMPRESSION: Acute occlusive thrombus within the left internal jugular vein. Acute nonocclusive thrombus within the left subclavian vein as well. Remaining venous structures of the left upper extremity are otherwise patent. Electronically Signed   By: Donavan Foil M.D.   On: 06/14/2016 03:14   Ct Angio Chest Aorta W And/or Wo Contrast  Result Date: 06/13/2016 CLINICAL DATA:  Chest pain radiating to jaw for 3 days. Shortness of breath, dizziness, and lightheadedness. Clinical suspicion for thoracic aortic dissection. EXAM: CT ANGIOGRAPHY CHEST WITH CONTRAST TECHNIQUE: Multidetector CT imaging of the chest was performed using the standard protocol during bolus administration of  intravenous contrast. Multiplanar CT image reconstructions and MIPs were obtained to evaluate the vascular anatomy. CONTRAST:  100 mL Isovue 370 COMPARISON:  None. FINDINGS: Cardiovascular: No evidence of thoracic aortic aneurysm or dissection. No evidence of mediastinal hematoma. No central pulmonary emboli identified. Normal heart size. No evidence of pericardial effusion. Mediastinum/Nodes: No soft tissue masses, lymphadenopathy, or mediastinal hematoma identified. Hazy density is seen in anterior mediastinum surrounding the left innominate vein, and tracking peripherally along the subclavian and axillary veins. This is nonspecific but may be due to upper extremity DVT/thrombophlebitis. Lungs/Pleura: No pulmonary mass, infiltrate, or effusion. Upper abdomen: No acute findings. Several tiny calcified gallstones noted, without evidence of cholecystitis. Musculoskeletal: No suspicious bone lesions or other significant abnormality identified. IMPRESSION: No evidence of thoracic aortic aneurysm or dissection. Hazy density in anterior mediastinum surrounding left innominate vein, and tracking peripherally along the left subclavian and axillary veins. This is nonspecific, but may be due to upper extremity DVT/thrombophlebitis. Recommend upper extremity venous Doppler ultrasound for further evaluation. No active lung disease. Cholelithiasis.  No radiographic evidence of cholecystitis. Electronically Signed   By: Earle Gell M.D.   On: 06/13/2016 22:16      Assessment/Plan  Accelerated hypertension blood pressure control important in reducing the progression of atherosclerotic disease. On appropriate oral medications.   Tobacco use disorder We had a discussion for approximately 3 minutes regarding the absolute need for smoking cessation due to the deleterious nature of tobacco on the vascular system. Continued smoking will likely increase his risk of future thrombotic issues. We discussed the tobacco use would  diminish patency of any intervention, and likely significantly worsen progression of disease. We discussed multiple agents for quitting including replacement therapy or medications to reduce cravings such as Chantix. The patient voices their understanding of the importance of smoking cessation.   Jugular vein thrombosis, left The patient has a left jugular vein thrombosis and has had a good symptomatic relief to initiation of anticoagulation. He should continue his anticoagulation for 2-3 months. I would then repeat his duplex and evaluate him for cessation of anticoagulation. Smoking cessation recommended. He is urged contact our office if he has any bleeding issues in the interim.    Leotis Pain, MD  07/01/2016 2:46 PM    This note was created with Dragon medical transcription system.  Any errors from dictation are purely unintentional

## 2016-07-11 ENCOUNTER — Inpatient Hospital Stay: Payer: Self-pay | Admitting: Hematology and Oncology

## 2016-07-11 DIAGNOSIS — I82B12 Acute embolism and thrombosis of left subclavian vein: Secondary | ICD-10-CM | POA: Insufficient documentation

## 2016-07-11 DIAGNOSIS — D6852 Prothrombin gene mutation: Secondary | ICD-10-CM | POA: Insufficient documentation

## 2016-07-11 NOTE — Progress Notes (Deleted)
[facility] day:  07/11/2016  Chief Complaint: Ricky Leonard is a 35 y.o. 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  HPI:   The patient was admitted to [facility] from 06/14/2016 - 06/16/2016 with accelerated hypertension, chest pain, shortness of breath, and left arm swelling.  He was hypertensive with a systolic blood pressure in the 200s. Chest CT angiogram on 06/14/2016 revealed no evidence of thoracic aortic aneurysm or dissection.  There was a hazy density in anterior mediastinum surrounding left innominate vein, and tracking peripherally along the left subclavian and axillary veins.   Left upper extremity duplex on 06/14/2016 revealed an acute occlusive thrombus within the left internal jugular vein.  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 Remaining venous structures of the left upper extremity were otherwise patent.  He underwent a limited hypercoagulable work-up on 06/15/2016.  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  The following studies were negative:  Factor V Leiden, lupus anticoagulant panel, anticardiolipin antibodies, beta-2 glycoprotein antibodies.  He was admitted to the ICU.  He was started on heparin.  He was discharged on Eliquis (5 mg BID).  He denies any past medical history except for hypertension. He has never had a thrombosis. He denies any left upper extremity IVs.  He denies drug use.  He denies any family history of clot. He notes left upper extremity swelling beginning late yesterday afternoon.  He states that he was moving totes filled with clothes for about an hour prior to the onset of swelling.  He denied any pain.  He was seen by vascular surgery.  No thrombolysis was indicated.    Symptomatically, he denies any  concerns.  He has lost 10 pounds in the past month secondary to trouble getting food.   Past Medical History:  Diagnosis Date  . DVT (deep venous thrombosis) ([place])   . Hypertension     Past Surgical History:  Procedure Laterality Date  . NO PAST SURGERIES      Family History  Problem Relation Age of Onset  . Hypertension Other   . Diabetes Maternal Grandmother     Social History:  reports that he has been smoking Cigarettes.  He has been smoking about 0.50 packs per day. He has never used smokeless tobacco. He reports that he drinks alcohol. He reports that he does not use drugs.  He has smoked half a pack a day for the past 9 years.  He has used marijuana. He denies any alcohol use.  He is unemployed.  He is alone today.  Allergies: No Known Allergies  Current Medications: Current Outpatient Prescriptions  Medication Sig Dispense Refill  . apixaban (ELIQUIS) 5 MG TABS tablet Take 2 tablets (10 mg total) by mouth 2 (two) times daily. Followed by 5 mg po bid. 24 tablet 0  . apixaban (ELIQUIS) 5 MG TABS tablet Take 1 tablet (5 mg total) by mouth 2 (two) times daily. 60 tablet 2  . carvedilol (COREG) 12.5 MG tablet Take 1 tablet (12.5 mg total) by mouth 2 (two) times daily with a meal. 60 tablet 2  . hydrochlorothiazide (HYDRODIURIL) 25 MG tablet Take 1 tablet (25 mg total) by mouth daily. 30 tablet 2  . lisinopril (PRINIVIL,ZESTRIL) 10 MG tablet Take 1 tablet (10 mg total) by mouth daily. Excel  tablet 2   No current facility-administered medications for this visit.     Review of Systems:  GENERAL:  Feels "ok".  No fevers or sweats.  Weight loss of 10 pounds in past month. PERFORMANCE STATUS (ECOG):  1 HEENT:  No visual changes, runny nose, sore throat, mouth sores or tenderness. Lungs: Shortness of breath, resolved.  No cough.  No hemoptysis. Cardiac:  Chest pain, resolved.  No palpitations, orthopnea, or PND. GI:  No nausea, vomiting, diarrhea, constipation, melena or  hematochezia. GU:  No urgency, frequency, dysuria, or hematuria. Musculoskeletal:  Bilateral shoulder and back pain.  No joint pain.  No muscle tenderness. Extremities:  Left upper extremity edema.  No pain. Skin:  No rashes or skin changes. Neuro:  No headache, numbness or weakness, balance or coordination issues. Endocrine:  No diabetes, thyroid issues, hot flashes or night sweats. Psych:  No mood changes, depression or anxiety. Pain:  No focal pain. Review of systems:  All other systems reviewed and found to be negative.  Physical Exam: There were no vitals taken for this visit. GENERAL:  Well developed, well nourished, sitting comfortably in the exam room in no acute distress. MENTAL STATUS:  Alert and oriented to person, place and time. HEAD:  Brown hair with beard.  Normocephalic, atraumatic, face symmetric, no Cushingoid features. EYES:  Brown eyes.  Pupils equal round and reactive to light and accomodation.  No conjunctivitis or scleral icterus. ENT:  Oropharynx clear without lesion.  Tongue normal. Mucous membranes moist.  RESPIRATORY:  Clear to auscultation without rales, wheezes or rhonchi. CARDIOVASCULAR:  Regular rate and rhythm without murmur, rub or gallop. CHEST WALL:  No dilated veins. ABDOMEN:  Soft, non-tender, with active bowel sounds, and no appreciable hepatosplenomegaly.  No masses. SKIN:  No rashes, ulcers or lesions. EXTREMITIES: No edema, no skin discoloration or tenderness.  No palpable cords. LYMPH NODES: No palpable cervical, supraclavicular, axillary or inguinal adenopathy  NEUROLOGICAL: Unremarkable. PSYCH:  Appropriate.   No visits with results within 3 Day(s) from this visit.  Latest known visit with results is:  Admission on 06/13/2016, Discharged on 06/16/2016  Component Date Value Ref Range Status  . Sodium 06/13/2016 137  135 - 145 mmol/L Final  . Potassium 06/13/2016 3.5  3.5 - 5.1 mmol/L Final  . Chloride 06/13/2016 104  101 - 111 mmol/L Final   . CO2 06/13/2016 26  22 - 32 mmol/L Final  . Glucose, Bld 06/13/2016 83  65 - 99 mg/dL Final  . BUN 06/13/2016 18  6 - 20 mg/dL Final  . Creatinine, Ser 06/13/2016 1.27* 0.61 - 1.24 mg/dL Final  . Calcium 06/13/2016 9.6  8.9 - 10.3 mg/dL Final  . GFR calc non Af Amer 06/13/2016 >60  >60 mL/min Final  . GFR calc Af Amer 06/13/2016 >60  >60 mL/min Final   Comment: (NOTE) The eGFR has been calculated using the CKD EPI equation. This calculation has not been validated in all clinical situations. eGFR's persistently <60 mL/min signify possible Chronic Kidney Disease.   . Anion gap 06/13/2016 7  5 - 15 Final  . WBC 06/13/2016 8.5  3.8 - 10.6 K/uL Final  . RBC 06/13/2016 4.73  4.40 - 5.90 MIL/uL Final  . Hemoglobin 06/13/2016 15.2  13.0 - 18.0 g/dL Final  . HCT 06/13/2016 44.3  40.0 - 52.0 % Final  . MCV 06/13/2016 93.7  80.0 - 100.0 fL Final  . MCH 06/13/2016 32.1  26.0 - 34.0 pg Final  . MCHC 06/13/2016  34.3  32.0 - 36.0 g/dL Final  . RDW 06/13/2016 13.4  11.5 - 14.5 % Final  . Platelets 06/13/2016 197  150 - 440 K/uL Final  . Troponin I 06/13/2016 <0.03  <0.03 ng/mL Final  . Tricyclic, Ur Screen 92/33/0076 NONE DETECTED  NONE DETECTED Final  . Amphetamines, Ur Screen 06/14/2016 NONE DETECTED  NONE DETECTED Final  . MDMA (Ecstasy)Ur Screen 06/14/2016 NONE DETECTED  NONE DETECTED Final  . Cocaine Metabolite,Ur [place] 06/14/2016 NONE DETECTED  NONE DETECTED Final  . Opiate, Ur Screen 06/14/2016 NONE DETECTED  NONE DETECTED Final  . Phencyclidine (PCP) Ur S 06/14/2016 NONE DETECTED  NONE DETECTED Final  . Cannabinoid 50 Ng, Ur [place] 06/14/2016 POSITIVE* NONE DETECTED Final  . Barbiturates, Ur Screen 06/14/2016 NONE DETECTED  NONE DETECTED Final  . Benzodiazepine, Ur Scrn 06/14/2016 NONE DETECTED  NONE DETECTED Final  . Methadone Scn, Ur 06/14/2016 NONE DETECTED  NONE DETECTED Final   Comment: (NOTE) 226  Tricyclics, urine               Cutoff 1000 ng/mL 200  Amphetamines, urine              Cutoff 1000 ng/mL 300  MDMA (Ecstasy), urine           Cutoff 500 ng/mL 400  Cocaine Metabolite, urine       Cutoff 300 ng/mL 500  Opiate, urine                   Cutoff 300 ng/mL 600  Phencyclidine (PCP), urine      Cutoff 25 ng/mL 700  Cannabinoid, urine              Cutoff 50 ng/mL 800  Barbiturates, urine             Cutoff 200 ng/mL 900  Benzodiazepine, urine           Cutoff 200 ng/mL 1000 Methadone, urine                Cutoff 300 ng/mL 1100 1200 The urine drug screen provides only a preliminary, unconfirmed 1300 analytical test result and should not be used for non-medical 1400 purposes. Clinical consideration and professional judgment should 1500 be applied to any positive drug screen result due to possible 1600 interfering substances. A more specific alternate chemical method 1700 must be used in order to obtain a confirmed analytical result.  1800 Gas chromato                          graphy / mass spectrometry (GC/MS) is the preferred 1900 confirmatory method.   Marland Kitchen MRSA by PCR 06/14/2016 NEGATIVE  NEGATIVE Final   Comment:        The GeneXpert MRSA Assay (FDA approved for NASAL specimens only), is one component of a comprehensive MRSA colonization surveillance program. It is not intended to diagnose MRSA infection nor to guide or monitor treatment for MRSA infections.   . Sodium 06/14/2016 137  135 - 145 mmol/L Final  . Potassium 06/14/2016 3.4* 3.5 - 5.1 mmol/L Final  . Chloride 06/14/2016 103  101 - 111 mmol/L Final  . CO2 06/14/2016 26  22 - 32 mmol/L Final  . Glucose, Bld 06/14/2016 115* 65 - 99 mg/dL Final  . BUN 06/14/2016 13  6 - 20 mg/dL Final  . Creatinine, Ser 06/14/2016 1.14  0.61 - 1.24 mg/dL Final  . Calcium 06/14/2016 9.4  8.9 - 10.3  mg/dL Final  . GFR calc non Af Amer 06/14/2016 >60  >60 mL/min Final  . GFR calc Af Amer 06/14/2016 >60  >60 mL/min Final   Comment: (NOTE) The eGFR has been calculated using the CKD EPI equation. This calculation has  not been validated in all clinical situations. eGFR's persistently <60 mL/min signify possible Chronic Kidney Disease.   . Anion gap 06/14/2016 8  5 - 15 Final  . WBC 06/14/2016 8.6  3.8 - 10.6 K/uL Final  . RBC 06/14/2016 4.63  4.40 - 5.90 MIL/uL Final  . Hemoglobin 06/14/2016 15.0  13.0 - 18.0 g/dL Final  . HCT 06/14/2016 42.4  40.0 - 52.0 % Final  . MCV 06/14/2016 91.6  80.0 - 100.0 fL Final  . MCH 06/14/2016 32.4  26.0 - 34.0 pg Final  . MCHC 06/14/2016 35.4  32.0 - 36.0 g/dL Final  . RDW 06/14/2016 13.4  11.5 - 14.5 % Final  . Platelets 06/14/2016 199  150 - 440 K/uL Final  . aPTT 06/14/2016 38* 24 - 36 seconds Final   Comment:        IF BASELINE aPTT IS ELEVATED, SUGGEST PATIENT RISK ASSESSMENT BE USED TO DETERMINE APPROPRIATE ANTICOAGULANT THERAPY.   . Prothrombin Time 06/14/2016 13.5  11.4 - 15.2 seconds Final  . INR 06/14/2016 1.03   Final  . Heparin Unfractionated 06/14/2016 1.38* 0.30 - 0.70 IU/mL Final   Comment:        IF HEPARIN RESULTS ARE BELOW EXPECTED VALUES, AND PATIENT DOSAGE HAS BEEN CONFIRMED, SUGGEST FOLLOW UP TESTING OF ANTITHROMBIN III LEVELS.   Marland Kitchen Recommendations-F5LEID: 06/18/2016 Comment   Final   Comment: (NOTE) Result:  Negative (no mutation found) Factor V Leiden is a specific mutation (R506Q) in the factor V gene that is associated with an increased risk of venous thrombosis. Factor V Leiden is more resistant to inactivation by activated protein C.  As a result, factor V persists in the circulation leading to a mild hyper- coagulable state.  The Leiden mutation accounts for 90% - 95% of APC resistance.  Factor V Leiden has been reported in patients with deep vein thrombosis, pulmonary embolus, central retinal vein occlusion, cerebral sinus thrombosis and hepatic vein thrombosis. Other risk factors to be considered in the workup for venous thrombosis include the G20210A mutation in the factor II (prothrombin) gene, protein S and C  deficiency, and antithrombin deficiencies. Anticardiolipin antibody and lupus anticoagulant analysis may be appropriate for certain patients, as well as homocysteine levels. Contact your local [facility] for information on how to order additi                          onal testing if desired.   . Comment 06/18/2016 Comment   Corrected   Comment: (NOTE) **Genetic counselors are available for health care providers to**  discuss results at [phone]). Methodology: DNA analysis of the Factor V gene was performed by allele-specific PCR. The diagnostic sensitivity and specificity is >99% for both. Molecular-based testing is highly accurate, but as in any laboratory test, diagnostic errors may occur. All test results must be combined with clinical information for the most accurate interpretation. This test was developed and its performance characteristics determined by [facility]. It has not been cleared or approved by the Food and Drug Administration. References: Voelkerding K (1996).  Clin Lab Med [phone]. Allison Quarry, PhD, [facility] Ruben Reason, PhD, [facility] Jens Som, PhD, [facility] Annetta Maw, M.S., PhD, [facility]  Alfredo Bach, PhD, [facility] Norva Riffle, PhD, [facility] Earlean Polka PhD, [facility] Performed At: [facility] [address] [place], [place] [id number] Rema Fendt                          D UR:[id number]   . Recommendations-PTGENE: 06/18/2016 Comment*  Final   Comment: (NOTE) SINGLE G-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 The incidence of this mutation in the [place] Caucasian population is about 2% and in the [place] American population it is approximately 0.5%. This mutation is rare in the [place] and Native American population. Being heterozygous for a prothrombin mutation increases the risk  for developing venous thrombosis about 2 to 3 times above the general population risk. Being homozygous for the prothrombin gene mutation increases the relative risk for venous thrombosis further, although it is not yet known how much further the risk is increased. In women heterozygous for the prothrombin gene mutation, the use of estrogen containing oral contraceptives increases the relative risk of venous thrombosis about 16 times and the risk of developing cerebral thrombosis is also significantly increased. In                           pregnancy the prothrombin gene mutation increases risk for venous thrombosis and may increase risk for stillbirth, placental abruption, pre-eclampsia and fetal growth restriction. If the patient possesses two or more congenital or acquired thrombophilic risk factors, the risk for thrombosis may rise to more than the sum of the risk ratios for the individual mutations. This assay detects only the prothrombin G20210A mutation and does not measure genetic abnormalities elsewhere in the genome. Other thrombotic risk factors may be pursued through systematic clinical laboratory analysis. These factors include the R506Q (Leiden) mutation in the Factor V gene, plasma homocysteine levels, as well as testing for deficiencies of antithrombin III, protein C and protein S.   . Additional Information 06/18/2016 Comment   Final   Comment: (NOTE) Genetic Counselors are available for health care providers to discuss results at [phone]). Methodology: DNA analysis of the Factor II gene was performed by PCR amplification followed by restriction analysis. The diagnostic sensitivity is >99% for both. All the tests must be combined with clinical information for the most accurate interpretation. Molecular-based testing is highly accurate, but as in any laboratory test, diagnostic errors may occur. This test was developed and its performance  characteristics determined by [facility]. It has not been cleared or approved by the Food and Drug Administration. Poort SR, et al. Blood. 1996; 17:6160-7371. Varga EA. Circulation. 2004; 062:I94-W54. Mervin Hack, et Arnold; 19:700-703. Allison Quarry, PhD, [facility] Ruben Reason, PhD, [facility] Jens Som, PhD, [facility] Annetta Maw, M.S., PhD, [facility] Alfredo Bach, PhD, [facility] Norva Riffle, PhD, [facility] Earlean Polka,                           PhD, [facility] Performed At: [facility] [address] [place], [place] [id number] Nechama Guard MD FG:[id number]   . PTT Lupus Anticoagulant 06/19/2016 36.7  0.0 - 51.9 sec Final   Comment: (NOTE) Additional testing confirms the presence of heparin in the test sample. Results obtained after heparin neutralization.   Marland Kitchen DRVVT 06/19/2016 38.0  0.0 -  47.0 sec Final  . Lupus Anticoag Interp 06/19/2016 Comment:   Corrected   Comment: (NOTE) No lupus anticoagulant was detected. Performed At: [facility] [place], [place] [id number] Lindon Romp MD ZT:[id number]   . Anticardiolipin IgG 06/17/2016 <9  0 - 14 GPL U/mL Final   Comment: (NOTE)                          Negative:              <15                          Indeterminate:     15 - 20                          Low-Med Positive: >20 - 80                          High Positive:         >80   . Anticardiolipin IgM 06/17/2016 <9  0 - 12 MPL U/mL Final   Comment: (NOTE)                          Negative:              <13                          Indeterminate:     13 - 20                          Low-Med Positive: >20 - 80                          High Positive:         >80   . Anticardiolipin IgA 06/17/2016 <9  0 - 11 APL U/mL Final   Comment: (NOTE)                          Negative:              <12                          Indeterminate:     12 - 20                          Low-Med Positive: >20 - 80                           High Positive:         >80 Performed At: [facility] [place], [place] [id number] Lindon Romp MD BH:[id number]   . Beta-2 Glyco I IgG 06/17/2016 <9  0 - 20 GPI IgG units Final   Comment: (NOTE) The reference interval reflects a 3SD or 99th percentile interval, which is thought to represent a potentially clinically significant result in accordance with the International Consensus Statement on the classification criteria for definitive antiphospholipid syndrome (APS). J Thromb Haem 2006;4:295-306.   . Beta-2-Glycoprotein I IgM 06/17/2016 <9  0 - 32 GPI  IgM units Final   Comment: (NOTE) The reference interval reflects a 3SD or 99th percentile interval, which is thought to represent a potentially clinically significant result in accordance with the International Consensus Statement on the classification criteria for definitive antiphospholipid syndrome (APS). J Thromb Haem 2006;4:295-306. Performed At: [facility] [place], [place] [id number] Lindon Romp MD NA:[id number]   . Beta-2-Glycoprotein I IgA 06/17/2016 <9  0 - 25 GPI IgA units Final   Comment: (NOTE) The reference interval reflects a 3SD or 99th percentile interval, which is thought to represent a potentially clinically significant result in accordance with the International Consensus Statement on the classification criteria for definitive antiphospholipid syndrome (APS). J Thromb Haem 2006;4:295-306.   . Sodium 06/15/2016 133* 135 - 145 mmol/L Final  . Potassium 06/15/2016 3.7  3.5 - 5.1 mmol/L Final  . Chloride 06/15/2016 102  101 - 111 mmol/L Final  . CO2 06/15/2016 25  22 - 32 mmol/L Final  . Glucose, Bld 06/15/2016 108* 65 - 99 mg/dL Final  . BUN 06/15/2016 12  6 - 20 mg/dL Final  . Creatinine, Ser 06/15/2016 1.03  0.61 - 1.24 mg/dL Final  . Calcium 06/15/2016 9.2  8.9 - 10.3 mg/dL Final  . GFR calc non Af Amer 06/15/2016 >60  >60 mL/min Final   . GFR calc Af Amer 06/15/2016 >60  >60 mL/min Final   Comment: (NOTE) The eGFR has been calculated using the CKD EPI equation. This calculation has not been validated in all clinical situations. eGFR's persistently <60 mL/min signify possible Chronic Kidney Disease.   . Anion gap 06/15/2016 6  5 - 15 Final  . Magnesium 06/15/2016 1.9  1.7 - 2.4 mg/dL Final  . Heparin Unfractionated 06/15/2016 0.46  0.30 - 0.70 IU/mL Final   Comment:        IF HEPARIN RESULTS ARE BELOW EXPECTED VALUES, AND PATIENT DOSAGE HAS BEEN CONFIRMED, SUGGEST FOLLOW UP TESTING OF ANTITHROMBIN III LEVELS.   . WBC 06/15/2016 10.7* 3.8 - 10.6 K/uL Final  . RBC 06/15/2016 4.53  4.40 - 5.90 MIL/uL Final  . Hemoglobin 06/15/2016 14.7  13.0 - 18.0 g/dL Final  . HCT 06/15/2016 42.2  40.0 - 52.0 % Final  . MCV 06/15/2016 93.1  80.0 - 100.0 fL Final  . MCH 06/15/2016 32.5  26.0 - 34.0 pg Final  . MCHC 06/15/2016 34.9  32.0 - 36.0 g/dL Final  . RDW 06/15/2016 13.6  11.5 - 14.5 % Final  . Platelets 06/15/2016 208  150 - 440 K/uL Final  . Troponin I 06/15/2016 0.22* <0.03 ng/mL Final   Comment: CRITICAL RESULT CALLED TO, READ BACK BY AND VERIFIED WITH FELICIA PREUDHOMME ON 42/7/06 AT 0935 BY [facility]   . Weight 06/15/2016 4000  oz Final  . Height 06/15/2016 70  in Final  . BP 06/15/2016 194/105  mmHg Final  . Cholesterol 06/15/2016 188  0 - 200 mg/dL Final  . Triglycerides 06/15/2016 80  <150 mg/dL Final  . HDL 06/15/2016 64  >40 mg/dL Final  . Total CHOL/HDL Ratio 06/15/2016 2.9  RATIO Final  . VLDL 06/15/2016 16  0 - 40 mg/dL Final  . LDL Cholesterol 06/15/2016 108* 0 - 99 mg/dL Final   Comment:        Total Cholesterol/HDL:CHD Risk Coronary Heart Disease Risk Table                     Men   Women  1/2 Average Risk  3.4   3.3  Average Risk       5.0   4.4  2 X Average Risk   9.6   7.1  3 X Average Risk  23.4   11.0        Use the calculated Patient Ratio above and the CHD Risk Table to determine the  patient's CHD Risk.        ATP III CLASSIFICATION (LDL):  <100     mg/dL   Optimal  100-129  mg/dL   Near or Above                    Optimal  130-159  mg/dL   Borderline  160-189  mg/dL   High  >190     mg/dL   Very High   . Troponin I 06/15/2016 0.21* <0.03 ng/mL Final   Comment: CRITICAL VALUE NOTED. VALUE IS CONSISTENT WITH PREVIOUSLY REPORTED/CALLED VALUE [facility]   . Glucose-Capillary 06/16/2016 104* 65 - 99 mg/dL Final    Assessment:  Ricky Leonard is a 35 y.o. male admitted with thrombosis in the left subclavian and left internal jugular vein.  Clot occurred after moving/lifting totes.  He has no history of prior thrombosis.  He denies any left upper extremity IV access or drug use.  He smokes.  He has no symptoms worrisome for malignancy.  CBC is normal.  He has no family history of thrombosis.  Plan:   1.  Discuss limited hypercoagulable work-up.  No current evidence of thoracic outlet abnormality.  Discuss plan for anticoagulation for minimum of 3-6 months.  Vascular to assess role of thrombolysis.  2.  Continue heparin with plan to convert to oral anticoagulation (Coumadin, Eliquis, or Xarelto). 3.  Encourage smoking cessation. 4.  Anticipate follow-up in outpatient department.  Vascular:  anticoagulation for 2-3 months. I would then repeat his duplex and evaluate him for cessation of anticoagulation. Smoking cessation recommended  Lequita Asal, MD  07/11/2016, 2:59 AM

## 2016-07-18 ENCOUNTER — Emergency Department
Admission: EM | Admit: 2016-07-18 | Discharge: 2016-07-18 | Discharge status: Against Medical Advice | Payer: Medicaid Other | Attending: Emergency Medicine | Admitting: Emergency Medicine

## 2016-07-18 ENCOUNTER — Inpatient Hospital Stay: Payer: Medicaid Other | Attending: Hematology and Oncology | Admitting: Hematology and Oncology

## 2016-07-18 ENCOUNTER — Other Ambulatory Visit: Payer: Self-pay | Admitting: *Deleted

## 2016-07-18 ENCOUNTER — Encounter: Payer: Self-pay | Admitting: *Deleted

## 2016-07-18 VITALS — BP 186/103 | HR 74 | Temp 97.6°F | Resp 18 | Ht 70.0 in | Wt 257.5 lb

## 2016-07-18 DIAGNOSIS — I82890 Acute embolism and thrombosis of other specified veins: Secondary | ICD-10-CM

## 2016-07-18 DIAGNOSIS — D6852 Prothrombin gene mutation: Secondary | ICD-10-CM | POA: Insufficient documentation

## 2016-07-18 DIAGNOSIS — M7989 Other specified soft tissue disorders: Secondary | ICD-10-CM | POA: Insufficient documentation

## 2016-07-18 DIAGNOSIS — Z86718 Personal history of other venous thrombosis and embolism: Secondary | ICD-10-CM

## 2016-07-18 DIAGNOSIS — H538 Other visual disturbances: Secondary | ICD-10-CM | POA: Insufficient documentation

## 2016-07-18 DIAGNOSIS — Z79899 Other long term (current) drug therapy: Secondary | ICD-10-CM | POA: Diagnosis not present

## 2016-07-18 DIAGNOSIS — F17210 Nicotine dependence, cigarettes, uncomplicated: Secondary | ICD-10-CM | POA: Insufficient documentation

## 2016-07-18 DIAGNOSIS — Z7901 Long term (current) use of anticoagulants: Secondary | ICD-10-CM | POA: Diagnosis not present

## 2016-07-18 DIAGNOSIS — I82C12 Acute embolism and thrombosis of left internal jugular vein: Secondary | ICD-10-CM | POA: Insufficient documentation

## 2016-07-18 DIAGNOSIS — I10 Essential (primary) hypertension: Secondary | ICD-10-CM

## 2016-07-18 DIAGNOSIS — Z9114 Patient's other noncompliance with medication regimen: Secondary | ICD-10-CM | POA: Diagnosis not present

## 2016-07-18 DIAGNOSIS — I82B12 Acute embolism and thrombosis of left subclavian vein: Secondary | ICD-10-CM | POA: Insufficient documentation

## 2016-07-18 MED ORDER — HYDROCHLOROTHIAZIDE 25 MG PO TABS
25.0000 mg | ORAL_TABLET | Freq: Once | ORAL | Status: AC
Start: 2016-07-18 — End: 2016-07-18
  Administered 2016-07-18: 25 mg via ORAL

## 2016-07-18 MED ORDER — CARVEDILOL 12.5 MG PO TABS
12.5000 mg | ORAL_TABLET | Freq: Two times a day (BID) | ORAL | Status: DC
  Administered 2016-07-18: 12.5 mg via ORAL

## 2016-07-18 MED ORDER — HYDROCHLOROTHIAZIDE 25 MG PO TABS
ORAL_TABLET | ORAL | Status: AC
  Administered 2016-07-18: 25 mg via ORAL
  Filled 2016-07-18: qty 1

## 2016-07-18 MED ORDER — LISINOPRIL 10 MG PO TABS
10.0000 mg | ORAL_TABLET | Freq: Once | ORAL | Status: AC
  Administered 2016-07-18: 10 mg via ORAL
  Filled 2016-07-18: qty 1

## 2016-07-18 MED ORDER — CARVEDILOL 25 MG PO TABS
ORAL_TABLET | ORAL | Status: AC
  Administered 2016-07-18: 12.5 mg via ORAL
  Filled 2016-07-18: qty 1

## 2016-07-18 NOTE — ED Notes (Signed)
Pt not in room   Dr Fanny Bienquale aware.

## 2016-07-18 NOTE — ED Provider Notes (Signed)
[facility] Emergency Department Provider Note   ____________________________________________   First MD Initiated Contact with Patient 07/18/16 1912     (approximate)  I have reviewed the triage vital signs and the nursing notes.   HISTORY  Chief Complaint Hypertension    HPI Ricky Leonard is a 36 y.o. male previous history of hypertensive emergency/urgency, DVT on anticoagulation.  The patient was at follow-up appointment today, and he knows his blood pressure is quite high. He denies any symptoms. He is not having any headache chest pain shortness of breath nausea vomiting or other concerns. No vision changes. He does however reports he did not take his medication this morning, sometimes he forgets to take his blood pressure medicine. He does report remaining compliant with eliquis, and reports that he has not run out of his medicine he just forgot to take them today.  He is still smoking.  Past Medical History:  Diagnosis Date  . DVT (deep venous thrombosis) (HCC)   . Hypertension     Patient Active Problem List   Diagnosis Date Noted  . 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 Prothrombin gene mutation (HCC) 07/11/2016  . Tobacco use disorder 07/01/2016  . 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 Accelerated hypertension 06/13/2016  . Chest pain 06/13/2016  . Left arm swelling 06/13/2016    Past Surgical History:  Procedure Laterality Date  . NO PAST SURGERIES      Prior to Admission medications   Medication Sig Start Date End Date Taking? Authorizing Provider  apixaban (ELIQUIS) 5 MG TABS tablet Take 2 tablets (10 mg total) by mouth 2 (two) times daily. Followed by 5 mg po bid. Patient not taking: Reported on 07/18/2016 06/16/16   Shaune PollackQing Chen, MD  apixaban (ELIQUIS) 5 MG TABS tablet Take 1 tablet (5 mg total) by mouth 2 (two) times daily. 06/22/16   Shaune PollackQing Chen, MD  carvedilol (COREG) 12.5 MG tablet Take 1 tablet (12.5 mg  total) by mouth 2 (two) times daily with a meal. 06/16/16   Shaune PollackQing Chen, MD  hydrochlorothiazide (HYDRODIURIL) 25 MG tablet Take 1 tablet (25 mg total) by mouth daily. 06/17/16   Shaune PollackQing Chen, MD  lisinopril (PRINIVIL,ZESTRIL) 10 MG tablet Take 1 tablet (10 mg total) by mouth daily. 06/17/16   Shaune PollackQing Chen, MD    Allergies Patient has no known allergies.  Family History  Problem Relation Age of Onset  . Hypertension Other   . Diabetes Maternal Grandmother     Social History Social History  Substance Use Topics  . Smoking status: Current Every Day Smoker    Packs/day: 0.50    Types: Cigarettes  . Smokeless tobacco: Never Used  . Alcohol use Yes    Review of Systems Constitutional: No fever/chills Eyes: No visual changes. ENT: No sore throat. Cardiovascular: Denies chest pain. Respiratory: Denies shortness of breath. Gastrointestinal: No abdominal pain.  No nausea, no vomiting.  No diarrhea.  No constipation. Genitourinary: Negative for dysuria. Musculoskeletal: Negative for back pain. Skin: Negative for rash. Neurological: Negative for headaches, focal weakness or numbness.  10-point ROS otherwise negative.  ____________________________________________   PHYSICAL EXAM:  VITAL SIGNS: ED Triage Vitals  Enc Vitals Group     BP 07/18/16 1729 (!) 202/115     Pulse Rate 07/18/16 1729 60     Resp 07/18/16 1729 18     Temp 07/18/16 1729 97.8 F (36.6 C)     Temp Source 07/18/16 1729 Oral  SpO2 07/18/16 1729 98 %     Weight 07/18/16 1730 257 lb (116.6 kg)     Height 07/18/16 1730 5\' 10"  (1.778 m)     Head Circumference --      Peak Flow --      Pain Score --      Pain Loc --      Pain Edu? --      Excl. in GC? --     Constitutional: Alert and oriented. Well appearing and in no acute distress. Eyes: Conjunctivae are normal. PERRL. EOMI. Head: Atraumatic. Nose: No congestion/rhinnorhea. Mouth/Throat: Mucous membranes are moist.  Oropharynx non-erythematous. Neck: No  stridor.   Cardiovascular: Normal rate, regular rhythm. Grossly normal heart sounds.  Good peripheral circulation. Respiratory: Normal respiratory effort.  No retractions. Lungs CTAB. Gastrointestinal: Soft and nontender.  Musculoskeletal: No lower extremity tenderness nor edema.  No joint effusions. Neurologic:  Normal speech and language. No gross focal neurologic deficits are appreciated. Skin:  Skin is warm, dry and intact. No rash noted. Psychiatric: Mood and affect are normal. Speech and behavior are normal.  ____________________________________________   LABS (all labs ordered are listed, but only abnormal results are displayed)  Labs Reviewed - No data to display ____________________________________________  EKG  ____________________________________________  RADIOLOGY   ____________________________________________   PROCEDURES  Procedure(s) performed: None  Procedures  Critical Care performed: No  ____________________________________________   INITIAL IMPRESSION / ASSESSMENT AND PLAN / ED COURSE  Pertinent labs & imaging results that were available during my care of the patient were reviewed by me and considered in my medical decision making (see chart for details).  Asymptomatic hypertension, likely due to medication noncompliance. He currently reports no symptomatology, and his clinical exam is very reassuring. I will give him home medications, and we will continue to monitor his blood pressure. He did have a recent admission for hypertensive urgency/emergency type situation, and certainly wish to see his blood pressure systolic below 185 for plan to discharge. The patient reports understands the importance of taking his medication, and his family who are with him reported that he'll work to make sure that he remains compliant.  I find no indication of hypertensive emergency today, he has not had any cardiac/pulmonary or neurologic symptomatology. Very  reassuring examination at this time.  Clinical Course    ----------------------------------------- 8:55 PM on 07/18/2016 -----------------------------------------  Micah Flesher to reevaluate patient, recheck blood pressure and symptomatology. The patient nodded in room, his belongings not present in nor is his family. Appears the patient has eloped.  ____________________________________________   FINAL CLINICAL IMPRESSION(S) / ED DIAGNOSES  Final diagnoses:  Hypertension, unspecified type      NEW MEDICATIONS STARTED DURING THIS VISIT:  New Prescriptions   No medications on file     Note:  This document was prepared using Dragon voice recognition software and may include unintentional dictation errors.     Sharyn Creamer, MD 07/18/16 2056

## 2016-07-18 NOTE — Patient Instructions (Signed)
Factor V-Leiden Tes

## 2016-07-18 NOTE — Progress Notes (Signed)
[facility] day:  07/18/2016  Chief Complaint: Ricky Leonard is a 36 y.o. 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  HPI:   The patient was admitted to [facility] from 06/14/2016 - 06/16/2016 with accelerated hypertension, chest pain, shortness of breath, and left arm swelling.  He was hypertensive with a systolic blood pressure in the 200s.  Chest CT angiogram on 06/14/2016 revealed no evidence of thoracic aortic aneurysm or dissection.  There was a hazy density in anterior mediastinum surrounding left innominate vein, and tracking peripherally along the left subclavian and axillary veins.   Left upper extremity duplex on 06/14/2016 revealed an acute occlusive thrombus within the left internal jugular vein.  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 Remaining venous structures of the left upper extremity were otherwise patent.  He underwent a limited hypercoagulable work-up on 06/15/2016.  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  The following studies were negative: factor V Leiden, lupus anticoagulant panel, anticardiolipin antibodies, beta-2 glycoprotein antibodies.  He was admitted to the ICU.  He was seen by vascular surgery.  No thrombolysis was indicated.  He was started on heparin.  He was discharged on Eliquis (5 mg BID).  He denies any past medical history except for hypertension.  Prior to recent events, he had never had a thrombosis. He denied any left upper extremity IVs.  He denied drug use.  He denied any family history of clot.  He noted left upper extremity swelling beginning the day before admission.  He was moving totes filled with clothes for about an hour prior to the onset of swelling.   Symptomatically, he denies any concerns.  His  left arm swelling has improved on Eliquis.  He runs out in 10-14 days.  He denies any bruising or bleeding.  He denies any weight loss.  Diet is better.    He does not have a primary care physician.  He is trying to get into the [facility].  He did not take his blood pressure medicine today.  Vision is blurry today.  He has an appointment with vascular surgery in 09/2016.   Past Medical History:  Diagnosis Date  . DVT (deep venous thrombosis) ([place])   . Hypertension     Past Surgical History:  Procedure Laterality Date  . NO PAST SURGERIES      Family History  Problem Relation Age of Onset  . Hypertension Other   . Diabetes Maternal Grandmother     Social History:  reports that he has been smoking Cigarettes.  He has been smoking about 0.50 packs per day. He has never used smokeless tobacco. He reports that he drinks alcohol. He reports that he does not use drugs.  He has smoked half a pack a day for the past 9 years.  He has used marijuana. He denies any alcohol use.  He is unemployed.  He is accompanied by his wife, Ricky Leonard, today.  Allergies: No Known Allergies  Current Medications: Current Outpatient Prescriptions  Medication Sig Dispense Refill  . apixaban (ELIQUIS) 5 MG TABS tablet Take 1 tablet (5 mg total) by mouth 2 (two) times daily. 60 tablet 2  . carvedilol (COREG) 12.5 MG tablet Take 1 tablet (12.5 mg total) by mouth 2 (two) times daily with a meal. 60  tablet 2  . hydrochlorothiazide (HYDRODIURIL) 25 MG tablet Take 1 tablet (25 mg total) by mouth daily. 30 tablet 2  . lisinopril (PRINIVIL,ZESTRIL) 10 MG tablet Take 1 tablet (10 mg total) by mouth daily. 30 tablet 2  . apixaban (ELIQUIS) 5 MG TABS tablet Take 2 tablets (10 mg total) by mouth 2 (two) times daily. Followed by 5 mg po bid. (Patient not taking: Reported on 07/18/2016) 24 tablet 0   No current facility-administered medications for this visit.     Review of Systems:  GENERAL:  Feels "ok".  Getting  over a cold.  No fevers or sweats.  Weight stable. PERFORMANCE STATUS (ECOG):  1 HEENT:  Visual changes.  No runny nose, sore throat, mouth sores or tenderness. Lungs:  No shortness of breath or cough.  No hemoptysis. Cardiac:  No chest pain, palpitations, orthopnea, or PND.  Hypertension, did not take medications today. GI:  No nausea, vomiting, diarrhea, constipation, melena or hematochezia. GU:  No urgency, frequency, dysuria, or hematuria. Musculoskeletal:  Bilateral shoulder and back pain, better.  No joint pain.  No muscle tenderness. Extremities:  Left upper extremity edema, improving.  No pain. Skin:  No rashes or skin changes. Neuro:  No headache, numbness or weakness, balance or coordination issues. Endocrine:  No diabetes, thyroid issues, hot flashes or night sweats. Psych:  No mood changes, depression or anxiety. Pain:  No focal pain. Review of systems:  All other systems reviewed and found to be negative.  Physical Exam: Blood pressure (!) 186/103, pulse 74, temperature 97.6 F (36.4 C), temperature source Tympanic, resp. rate 18, height _0  (1.778 m), weight 257 lb 8 oz (116.8 kg). GENERAL:  Well developed, well nourished, gentleman sitting comfortably in the exam room in no acute distress. MENTAL STATUS:  Alert and oriented to person, place and time. HEAD:  Wearing a cap.  Brown hair with beard.  Normocephalic, atraumatic, face symmetric, no Cushingoid features. EYES:  Brown eyes.  Pupils equal round and reactive to light and accomodation.  No conjunctivitis or scleral icterus. ENT:  Oropharynx clear without lesion.  Tongue normal. Mucous membranes moist.  RESPIRATORY:  Clear to auscultation without rales, wheezes or rhonchi. CARDIOVASCULAR:  Regular rate and rhythm without murmur, rub or gallop. ABDOMEN:  Soft, non-tender, with active bowel sounds, and no appreciable hepatosplenomegaly.  No masses. SKIN:  No rashes, ulcers or lesions. EXTREMITIES: Subtle left upper  extremity edema.  No lower extremity edema, no skin discoloration or tenderness.  No palpable cords. LYMPH NODES: No palpable cervical, supraclavicular, axillary or inguinal adenopathy  NEUROLOGICAL: Unremarkable. PSYCH:  Appropriate.   No visits with results within 3 Day(s) from this visit.  Latest known visit with results is:  Admission on 06/13/2016, Discharged on 06/16/2016  Component Date Value Ref Range Status  . Sodium 06/13/2016 137  135 - 145 mmol/L Final  . Potassium 06/13/2016 3.5  3.5 - 5.1 mmol/L Final  . Chloride 06/13/2016 104  101 - 111 mmol/L Final  . CO2 06/13/2016 26  22 - 32 mmol/L Final  . Glucose, Bld 06/13/2016 83  65 - 99 mg/dL Final  . BUN 06/13/2016 18  6 - 20 mg/dL Final  . Creatinine, Ser 06/13/2016 1.27* 0.61 - 1.24 mg/dL Final  . Calcium 06/13/2016 9.6  8.9 - 10.3 mg/dL Final  . GFR calc non Af Amer 06/13/2016 >60  >60 mL/min Final  . GFR calc Af Amer 06/13/2016 >60  >60 mL/min Final   Comment: (NOTE) The eGFR has  been calculated using the CKD EPI equation. This calculation has not been validated in all clinical situations. eGFR's persistently <60 mL/min signify possible Chronic Kidney Disease.   . Anion gap 06/13/2016 7  5 - 15 Final  . WBC 06/13/2016 8.5  3.8 - 10.6 K/uL Final  . RBC 06/13/2016 4.73  4.40 - 5.90 MIL/uL Final  . Hemoglobin 06/13/2016 15.2  13.0 - 18.0 g/dL Final  . HCT 06/13/2016 44.3  40.0 - 52.0 % Final  . MCV 06/13/2016 93.7  80.0 - 100.0 fL Final  . MCH 06/13/2016 32.1  26.0 - 34.0 pg Final  . MCHC 06/13/2016 34.3  32.0 - 36.0 g/dL Final  . RDW 06/13/2016 13.4  11.5 - 14.5 % Final  . Platelets 06/13/2016 197  150 - 440 K/uL Final  . Troponin I 06/13/2016 <0.03  <0.03 ng/mL Final  . Tricyclic, Ur Screen 97/35/3299 NONE DETECTED  NONE DETECTED Final  . Amphetamines, Ur Screen 06/14/2016 NONE DETECTED  NONE DETECTED Final  . MDMA (Ecstasy)Ur Screen 06/14/2016 NONE DETECTED  NONE DETECTED Final  . Cocaine Metabolite,Ur [place]  06/14/2016 NONE DETECTED  NONE DETECTED Final  . Opiate, Ur Screen 06/14/2016 NONE DETECTED  NONE DETECTED Final  . Phencyclidine (PCP) Ur S 06/14/2016 NONE DETECTED  NONE DETECTED Final  . Cannabinoid 50 Ng, Ur [place] 06/14/2016 POSITIVE* NONE DETECTED Final  . Barbiturates, Ur Screen 06/14/2016 NONE DETECTED  NONE DETECTED Final  . Benzodiazepine, Ur Scrn 06/14/2016 NONE DETECTED  NONE DETECTED Final  . Methadone Scn, Ur 06/14/2016 NONE DETECTED  NONE DETECTED Final   Comment: (NOTE) 242  Tricyclics, urine               Cutoff 1000 ng/mL 200  Amphetamines, urine             Cutoff 1000 ng/mL 300  MDMA (Ecstasy), urine           Cutoff 500 ng/mL 400  Cocaine Metabolite, urine       Cutoff 300 ng/mL 500  Opiate, urine                   Cutoff 300 ng/mL 600  Phencyclidine (PCP), urine      Cutoff 25 ng/mL 700  Cannabinoid, urine              Cutoff 50 ng/mL 800  Barbiturates, urine             Cutoff 200 ng/mL 900  Benzodiazepine, urine           Cutoff 200 ng/mL 1000 Methadone, urine                Cutoff 300 ng/mL 1100 1200 The urine drug screen provides only a preliminary, unconfirmed 1300 analytical test result and should not be used for non-medical 1400 purposes. Clinical consideration and professional judgment should 1500 be applied to any positive drug screen result due to possible 1600 interfering substances. A more specific alternate chemical method 1700 must be used in order to obtain a confirmed analytical result.  1800 Gas chromato                          graphy / mass spectrometry (GC/MS) is the preferred 1900 confirmatory method.   Marland Kitchen MRSA by PCR 06/14/2016 NEGATIVE  NEGATIVE Final   Comment:        The GeneXpert MRSA Assay (FDA approved for NASAL specimens only), is one component of a comprehensive  MRSA colonization surveillance program. It is not intended to diagnose MRSA infection nor to guide or monitor treatment for MRSA infections.   . Sodium 06/14/2016 137   135 - 145 mmol/L Final  . Potassium 06/14/2016 3.4* 3.5 - 5.1 mmol/L Final  . Chloride 06/14/2016 103  101 - 111 mmol/L Final  . CO2 06/14/2016 26  22 - 32 mmol/L Final  . Glucose, Bld 06/14/2016 115* 65 - 99 mg/dL Final  . BUN 06/14/2016 13  6 - 20 mg/dL Final  . Creatinine, Ser 06/14/2016 1.14  0.61 - 1.24 mg/dL Final  . Calcium 06/14/2016 9.4  8.9 - 10.3 mg/dL Final  . GFR calc non Af Amer 06/14/2016 >60  >60 mL/min Final  . GFR calc Af Amer 06/14/2016 >60  >60 mL/min Final   Comment: (NOTE) The eGFR has been calculated using the CKD EPI equation. This calculation has not been validated in all clinical situations. eGFR's persistently <60 mL/min signify possible Chronic Kidney Disease.   . Anion gap 06/14/2016 8  5 - 15 Final  . WBC 06/14/2016 8.6  3.8 - 10.6 K/uL Final  . RBC 06/14/2016 4.63  4.40 - 5.90 MIL/uL Final  . Hemoglobin 06/14/2016 15.0  13.0 - 18.0 g/dL Final  . HCT 06/14/2016 42.4  40.0 - 52.0 % Final  . MCV 06/14/2016 91.6  80.0 - 100.0 fL Final  . MCH 06/14/2016 32.4  26.0 - 34.0 pg Final  . MCHC 06/14/2016 35.4  32.0 - 36.0 g/dL Final  . RDW 06/14/2016 13.4  11.5 - 14.5 % Final  . Platelets 06/14/2016 199  150 - 440 K/uL Final  . aPTT 06/14/2016 38* 24 - 36 seconds Final   Comment:        IF BASELINE aPTT IS ELEVATED, SUGGEST PATIENT RISK ASSESSMENT BE USED TO DETERMINE APPROPRIATE ANTICOAGULANT THERAPY.   . Prothrombin Time 06/14/2016 13.5  11.4 - 15.2 seconds Final  . INR 06/14/2016 1.03   Final  . Heparin Unfractionated 06/14/2016 1.38* 0.30 - 0.70 IU/mL Final   Comment:        IF HEPARIN RESULTS ARE BELOW EXPECTED VALUES, AND PATIENT DOSAGE HAS BEEN CONFIRMED, SUGGEST FOLLOW UP TESTING OF ANTITHROMBIN III LEVELS.   Marland Kitchen Recommendations-F5LEID: 06/18/2016 Comment   Final   Comment: (NOTE) Result:  Negative (no mutation found) Factor V Leiden is a specific mutation (R506Q) in the factor V gene that is associated with an increased risk of  venous thrombosis. Factor V Leiden is more resistant to inactivation by activated protein C.  As a result, factor V persists in the circulation leading to a mild hyper- coagulable state.  The Leiden mutation accounts for 90% - 95% of APC resistance.  Factor V Leiden has been reported in patients with deep vein thrombosis, pulmonary embolus, central retinal vein occlusion, cerebral sinus thrombosis and hepatic vein thrombosis. Other risk factors to be considered in the workup for venous thrombosis include the G20210A mutation in the factor II (prothrombin) gene, protein S and C deficiency, and antithrombin deficiencies. Anticardiolipin antibody and lupus anticoagulant analysis may be appropriate for certain patients, as well as homocysteine levels. Contact your local [facility] for information on how to order additi                          onal testing if desired.   . Comment 06/18/2016 Comment   Corrected   Comment: (NOTE) **Genetic counselors are available for health care providers to**  discuss  results at [phone]). Methodology: DNA analysis of the Factor V gene was performed by allele-specific PCR. The diagnostic sensitivity and specificity is >99% for both. Molecular-based testing is highly accurate, but as in any laboratory test, diagnostic errors may occur. All test results must be combined with clinical information for the most accurate interpretation. This test was developed and its performance characteristics determined by [facility]. It has not been cleared or approved by the Food and Drug Administration. References: Voelkerding K (1996).  Clin Lab Med [phone]. Allison Quarry, PhD, [facility] Ruben Reason, PhD, [facility] Jens Som, PhD, [facility] Annetta Maw, M.S., PhD, [facility] Alfredo Bach, PhD, [facility] Norva Riffle, PhD, [facility] Earlean Polka PhD, [facility] Performed At: [place], [place] [id number] Rema Fendt                           D NI:[id number]   . Recommendations-PTGENE: 06/18/2016 Comment*  Final   Comment: (NOTE) SINGLE G-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 The incidence of this mutation in the [place] Caucasian population is about 2% and in the [place] American population it is approximately 0.5%. This mutation is rare in the [place] and Native American population. Being heterozygous for a prothrombin mutation increases the risk for developing venous thrombosis about 2 to 3 times above the general population risk. Being homozygous for the prothrombin gene mutation increases the relative risk for venous thrombosis further, although it is not yet known how much further the risk is increased. In women heterozygous for the prothrombin gene mutation, the use of estrogen containing oral contraceptives increases the relative risk of venous thrombosis about 16 times and the risk of developing cerebral thrombosis is also significantly increased. In                           pregnancy the prothrombin gene mutation increases risk for venous thrombosis and may increase risk for stillbirth, placental abruption, pre-eclampsia and fetal growth restriction. If the patient possesses two or more congenital or acquired thrombophilic risk factors, the risk for thrombosis may rise to more than the sum of the risk ratios for the individual mutations. This assay detects only the prothrombin G20210A mutation and does not measure genetic abnormalities elsewhere in the genome. Other thrombotic risk factors may be pursued through systematic clinical laboratory analysis. These factors include the R506Q (Leiden) mutation in the Factor V gene, plasma homocysteine levels, as well as testing for deficiencies of antithrombin III, protein C and protein S.   . Additional  Information 06/18/2016 Comment   Final   Comment: (NOTE) Genetic Counselors are available for health care providers to discuss results at [phone]). Methodology: DNA analysis of the Factor II gene was performed by PCR amplification followed by restriction analysis. The diagnostic sensitivity is >99% for both. All the tests must be combined with clinical information for the most accurate interpretation. Molecular-based testing is highly accurate, but as in any laboratory test, diagnostic errors may occur. This test was developed and its performance characteristics determined by [facility]. It has not been cleared or approved by the Food and Drug Administration. Poort SR, et al. Blood. 1996; 15:4008-6761. Varga EA. Circulation. 2004; 950:D32-I71. Mervin Hack, et [place]; 19:700-703. Allison Quarry, PhD, [facility] Ruben Reason, PhD, [facility]  Jens Som, PhD, [facility] Annetta Maw, M.S., PhD, [facility] Alfredo Bach, PhD, [facility] Norva Riffle, PhD, [facility] Earlean Polka,                           PhD, [facility] Performed At: [facility] [address] [place], [place] [id number] Nechama Guard MD TU:[id number]   . PTT Lupus Anticoagulant 06/19/2016 36.7  0.0 - 51.9 sec Final   Comment: (NOTE) Additional testing confirms the presence of heparin in the test sample. Results obtained after heparin neutralization.   Marland Kitchen DRVVT 06/19/2016 38.0  0.0 - 47.0 sec Final  . Lupus Anticoag Interp 06/19/2016 Comment:   Corrected   Comment: (NOTE) No lupus anticoagulant was detected. Performed At: [facility] [place], [place] [id number] Lindon Romp MD XY:[id number]   . Anticardiolipin IgG 06/17/2016 <9  0 - 14 GPL U/mL Final   Comment: (NOTE)                          Negative:              <15                          Indeterminate:     15 - 20                          Low-Med Positive: >20 - 80                           High Positive:         >80   . Anticardiolipin IgM 06/17/2016 <9  0 - 12 MPL U/mL Final   Comment: (NOTE)                          Negative:              <13                          Indeterminate:     13 - 20                          Low-Med Positive: >20 - 80                          High Positive:         >80   . Anticardiolipin IgA 06/17/2016 <9  0 - 11 APL U/mL Final   Comment: (NOTE)                          Negative:              <12                          Indeterminate:     12 - 20                          Low-Med Positive: >20 - 80  High Positive:         >80 Performed At: [facility] [place], [place] [id number] Lindon Romp MD TK:[id number]   . Beta-2 Glyco I IgG 06/17/2016 <9  0 - 20 GPI IgG units Final   Comment: (NOTE) The reference interval reflects a 3SD or 99th percentile interval, which is thought to represent a potentially clinically significant result in accordance with the International Consensus Statement on the classification criteria for definitive antiphospholipid syndrome (APS). J Thromb Haem 2006;4:295-306.   . Beta-2-Glycoprotein I IgM 06/17/2016 <9  0 - 32 GPI IgM units Final   Comment: (NOTE) The reference interval reflects a 3SD or 99th percentile interval, which is thought to represent a potentially clinically significant result in accordance with the International Consensus Statement on the classification criteria for definitive antiphospholipid syndrome (APS). J Thromb Haem 2006;4:295-306. Performed At: [facility] [place], [place] [id number] Lindon Romp MD [id number]   . Beta-2-Glycoprotein I IgA 06/17/2016 <9  0 - 25 GPI IgA units Final   Comment: (NOTE) The reference interval reflects a 3SD or 99th percentile interval, which is thought to represent a potentially clinically significant result in accordance with the International Consensus Statement  on the classification criteria for definitive antiphospholipid syndrome (APS). J Thromb Haem 2006;4:295-306.   . Sodium 06/15/2016 133* 135 - 145 mmol/L Final  . Potassium 06/15/2016 3.7  3.5 - 5.1 mmol/L Final  . Chloride 06/15/2016 102  101 - 111 mmol/L Final  . CO2 06/15/2016 25  22 - 32 mmol/L Final  . Glucose, Bld 06/15/2016 108* 65 - 99 mg/dL Final  . BUN 06/15/2016 12  6 - 20 mg/dL Final  . Creatinine, Ser 06/15/2016 1.03  0.61 - 1.24 mg/dL Final  . Calcium 06/15/2016 9.2  8.9 - 10.3 mg/dL Final  . GFR calc non Af Amer 06/15/2016 >60  >60 mL/min Final  . GFR calc Af Amer 06/15/2016 >60  >60 mL/min Final   Comment: (NOTE) The eGFR has been calculated using the CKD EPI equation. This calculation has not been validated in all clinical situations. eGFR's persistently <60 mL/min signify possible Chronic Kidney Disease.   . Anion gap 06/15/2016 6  5 - 15 Final  . Magnesium 06/15/2016 1.9  1.7 - 2.4 mg/dL Final  . Heparin Unfractionated 06/15/2016 0.46  0.30 - 0.70 IU/mL Final   Comment:        IF HEPARIN RESULTS ARE BELOW EXPECTED VALUES, AND PATIENT DOSAGE HAS BEEN CONFIRMED, SUGGEST FOLLOW UP TESTING OF ANTITHROMBIN III LEVELS.   . WBC 06/15/2016 10.7* 3.8 - 10.6 K/uL Final  . RBC 06/15/2016 4.53  4.40 - 5.90 MIL/uL Final  . Hemoglobin 06/15/2016 14.7  13.0 - 18.0 g/dL Final  . HCT 06/15/2016 42.2  40.0 - 52.0 % Final  . MCV 06/15/2016 93.1  80.0 - 100.0 fL Final  . MCH 06/15/2016 32.5  26.0 - 34.0 pg Final  . MCHC 06/15/2016 34.9  32.0 - 36.0 g/dL Final  . RDW 06/15/2016 13.6  11.5 - 14.5 % Final  . Platelets 06/15/2016 208  150 - 440 K/uL Final  . Troponin I 06/15/2016 0.22* <0.03 ng/mL Final   Comment: CRITICAL RESULT CALLED TO, READ BACK BY AND VERIFIED WITH FELICIA PREUDHOMME ON 66/5/99 AT 0935 BY [facility]   . Weight 06/15/2016 4000  oz Final  . Height 06/15/2016 70  in Final  . BP 06/15/2016 194/105  mmHg Final  . Cholesterol 06/15/2016 188  0 -  200 mg/dL Final  .  Triglycerides 06/15/2016 80  <150 mg/dL Final  . HDL 06/15/2016 64  >40 mg/dL Final  . Total CHOL/HDL Ratio 06/15/2016 2.9  RATIO Final  . VLDL 06/15/2016 16  0 - 40 mg/dL Final  . LDL Cholesterol 06/15/2016 108* 0 - 99 mg/dL Final   Comment:        Total Cholesterol/HDL:CHD Risk Coronary Heart Disease Risk Table                     Men   Women  1/2 Average Risk   3.4   3.3  Average Risk       5.0   4.4  2 X Average Risk   9.6   7.1  3 X Average Risk  23.4   11.0        Use the calculated Patient Ratio above and the CHD Risk Table to determine the patient's CHD Risk.        ATP III CLASSIFICATION (LDL):  <100     mg/dL   Optimal  100-129  mg/dL   Near or Above                    Optimal  130-159  mg/dL   Borderline  160-189  mg/dL   High  >190     mg/dL   Very High   . Troponin I 06/15/2016 0.21* <0.03 ng/mL Final   Comment: CRITICAL VALUE NOTED. VALUE IS CONSISTENT WITH PREVIOUSLY REPORTED/CALLED VALUE [facility]   . Glucose-Capillary 06/16/2016 104* 65 - 99 mg/dL Final    Assessment:  Tarique A Ridlon is a 36 y.o. 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  Clot occurred after moving/lifting totes.  He is on Eliquis.  He has no history of prior thrombosis.  He denies any left upper extremity IV access or drug use.  He smokes.  He has no symptoms worrisome for malignancy.  CBC was normal.  He has no family history of thrombosis.  Work-up on 06/15/2016 revealed heterozygosity for the prothrombin gene mutation (single G-20210-A mutation in the factor II gene).  The following studies were negative: factor V Leiden, lupus anticoagulant panel, anticardiolipin antibodies, beta-2 glycoprotein antibodies.  Symptomatically, he denies any complaints except for blurry vision.  Hypertension is poorly controlled secondary to medication non-compliance.  Plan:   1.  Discuss hypercoagulable work-up from  hospitalization.  Discuss prothrombin gene mutation.  Discuss additional hypercoagulable work-up to ensure no other risk factors.  Discuss continuation of Eliquis for a minimum of 6 months.   2.  Assist with patient obtaining Eliquis (runs out in 10-14 days). 3.  Discuss poorly controlled hypertension.  Patient to be seen in ER today. 4.  Encourage smoking cessation. 5.  Patient to follow-up with physicians from [facility]. 6.  RTC in 14 days for labs (CBC with diff, CMP, protein C activity/antigen, protein S activity/antigen, ATIII antigen/activity). 7.  RTC in 1 month for MD assessment.   Lequita Asal, MD  07/18/2016, 3:59 PM

## 2016-07-18 NOTE — Progress Notes (Signed)
Patient here today as hospital followup.  States he had a DVT under his left clavicle and in his left neck.  Currently taking 5 mg Eliquis twice a day.  BP elevated today.  States he has not taken any of his BP meds today.  BP 186/103 HR 74  Recheck 186/110  HR 69.  States when he does not take his medication his BP elevates.

## 2016-07-18 NOTE — ED Triage Notes (Signed)
States he was at cancer clinic for evaluation of blood clotting disorder (pt presently on elquis) and they told him his BP was high, pt takes HCTZ, lisinopril, and coreg at home but states he did not take his meds this AM, denies any symptoms, states he feels okay

## 2016-07-20 ENCOUNTER — Encounter: Payer: Self-pay | Admitting: Hematology and Oncology

## 2016-07-25 ENCOUNTER — Telehealth: Payer: Self-pay | Admitting: *Deleted

## 2016-07-25 ENCOUNTER — Other Ambulatory Visit: Payer: Self-pay | Admitting: Hematology and Oncology

## 2016-07-25 DIAGNOSIS — I82890 Acute embolism and thrombosis of other specified veins: Secondary | ICD-10-CM

## 2016-07-25 DIAGNOSIS — I82B12 Acute embolism and thrombosis of left subclavian vein: Secondary | ICD-10-CM

## 2016-07-25 MED ORDER — APIXABAN 5 MG PO TABS: 5.0000 mg | ORAL_TABLET | Freq: Two times a day (BID) | ORAL | 11 refills | Status: DC

## 2016-07-25 NOTE — Telephone Encounter (Signed)
Patient called and instructed to come to clinic with w2 form or proof of income for pt assistance program for eliquis today before 5 pm in order to fax the form in today. Voiced understanding and agreed with coming in today.

## 2016-08-01 ENCOUNTER — Inpatient Hospital Stay: Payer: Medicaid Other

## 2016-08-01 ENCOUNTER — Telehealth: Payer: Self-pay | Admitting: *Deleted

## 2016-08-01 DIAGNOSIS — I82B12 Acute embolism and thrombosis of left subclavian vein: Secondary | ICD-10-CM | POA: Diagnosis not present

## 2016-08-01 DIAGNOSIS — Z86718 Personal history of other venous thrombosis and embolism: Secondary | ICD-10-CM

## 2016-08-01 LAB — CBC WITH DIFFERENTIAL/PLATELET
Basophils Absolute: 0 10*3/uL (ref 0–0.1)
Basophils Relative: 0 %
Eosinophils Absolute: 0.3 10*3/uL (ref 0–0.7)
Eosinophils Relative: 6 %
HCT: 42 % (ref 40.0–52.0)
Hemoglobin: 14.4 g/dL (ref 13.0–18.0)
Lymphocytes Relative: 33 %
Lymphs Abs: 1.9 10*3/uL (ref 1.0–3.6)
MCH: 31.7 pg (ref 26.0–34.0)
MCHC: 34.3 g/dL (ref 32.0–36.0)
MCV: 92.4 fL (ref 80.0–100.0)
Monocytes Absolute: 0.4 10*3/uL (ref 0.2–1.0)
Monocytes Relative: 7 %
Neutro Abs: 3.3 10*3/uL (ref 1.4–6.5)
Neutrophils Relative %: 54 %
Platelets: 230 10*3/uL (ref 150–440)
RBC: 4.54 MIL/uL (ref 4.40–5.90)
RDW: 14.1 % (ref 11.5–14.5)
WBC: 6 10*3/uL (ref 3.8–10.6)

## 2016-08-01 LAB — COMPREHENSIVE METABOLIC PANEL
ALT: 16 U/L — ABNORMAL LOW (ref 17–63)
AST: 20 U/L (ref 15–41)
Albumin: 4.4 g/dL (ref 3.5–5.0)
Alkaline Phosphatase: 60 U/L (ref 38–126)
Anion gap: 8 (ref 5–15)
BUN: 11 mg/dL (ref 6–20)
CO2: 28 mmol/L (ref 22–32)
Calcium: 9.4 mg/dL (ref 8.9–10.3)
Chloride: 101 mmol/L (ref 101–111)
Creatinine, Ser: 1.3 mg/dL — ABNORMAL HIGH (ref 0.61–1.24)
GFR calc Af Amer: >60 mL/min (ref >60)
GFR calc non Af Amer: >60 mL/min (ref >60)
Glucose, Bld: 97 mg/dL (ref 65–99)
Potassium: 3.4 mmol/L — ABNORMAL LOW (ref 3.5–5.1)
Sodium: 137 mmol/L (ref 135–145)
Total Bilirubin: 0.8 mg/dL (ref 0.3–1.2)
Total Protein: 7.3 g/dL (ref 6.5–8.1)

## 2016-08-01 NOTE — Telephone Encounter (Signed)
Called [employer] and spoke to NewfoldenEbony and she tells me that his eliquis is due to be shipped out next week and deliver to pt tues  From 8 am and 8 pm.

## 2016-08-02 LAB — PROTEIN S ACTIVITY: Protein S Activity: 89 % (ref 63–140)

## 2016-08-02 LAB — PROTEIN C ACTIVITY: Protein C Activity: 127 % (ref 73–180)

## 2016-08-02 LAB — ANTITHROMBIN III ANTIGEN: AT III AG PPP IMM-ACNC: 92 % (ref 72–124)

## 2016-08-03 ENCOUNTER — Emergency Department
Admission: EM | Admit: 2016-08-03 | Discharge: 2016-08-03 | Disposition: A | Discharge status: Home / Self Care | Payer: Medicaid Other | Attending: Emergency Medicine | Admitting: Emergency Medicine

## 2016-08-03 ENCOUNTER — Encounter: Payer: Self-pay | Admitting: Emergency Medicine

## 2016-08-03 DIAGNOSIS — J09X2 Influenza due to identified novel influenza A virus with other respiratory manifestations: Secondary | ICD-10-CM | POA: Insufficient documentation

## 2016-08-03 DIAGNOSIS — I10 Essential (primary) hypertension: Secondary | ICD-10-CM | POA: Diagnosis not present

## 2016-08-03 DIAGNOSIS — F17210 Nicotine dependence, cigarettes, uncomplicated: Secondary | ICD-10-CM | POA: Insufficient documentation

## 2016-08-03 DIAGNOSIS — R509 Fever, unspecified: Secondary | ICD-10-CM | POA: Diagnosis present

## 2016-08-03 DIAGNOSIS — J101 Influenza due to other identified influenza virus with other respiratory manifestations: Secondary | ICD-10-CM

## 2016-08-03 LAB — INFLUENZA PANEL BY PCR (TYPE A & B)
Influenza A By PCR: POSITIVE — AB
Influenza B By PCR: NEGATIVE

## 2016-08-03 MED ORDER — ACETAMINOPHEN 325 MG PO TABS
ORAL_TABLET | ORAL | Status: AC
  Filled 2016-08-03: qty 2

## 2016-08-03 MED ORDER — ACETAMINOPHEN 325 MG PO TABS
650.0000 mg | ORAL_TABLET | Freq: Once | ORAL | Status: AC
  Administered 2016-08-03: 650 mg via ORAL

## 2016-08-03 MED ORDER — OSELTAMIVIR PHOSPHATE 75 MG PO CAPS: 75.0000 mg | ORAL_CAPSULE | Freq: Two times a day (BID) | ORAL | 0 refills | Status: AC

## 2016-08-03 NOTE — ED Triage Notes (Signed)
Pt is ambulatory to triage with c/o general body aches, fever, and occasional diarrhea. Pt is in NAD at this time and states that others have been around him recently with similar symptoms.

## 2016-08-03 NOTE — ED Notes (Signed)
Pt discharged to home.  Family member driving.  Discharge instructions reviewed.  Verbalized understanding.  No questions or concerns at this time.  Teach back verified.  Pt in NAD.  No items left in ED.   

## 2016-08-04 LAB — PROTEIN C, TOTAL: Protein C, Total: 84 % (ref 60–150)

## 2016-08-04 NOTE — ED Provider Notes (Signed)
[facility] Emergency Department Provider Note  ____________________________________________  Time seen: Approximately 2:26 PM  I have reviewed the triage vital signs and the nursing notes.   HISTORY  Chief Complaint Influenza    HPI Ricky Leonard is a 36 y.o. male presenting to the emergency department with headache, malaise, congestion, fatigue, diarrhea and fever. Fever has been as high as 101F assessed orally. He denies chest pain, shortness of breath, lower extremity pain, abdominal pain, dysuria and hematuria. He is tolerating fluids by mouth. He is experiencing diminished appetite. No recent travel. Patient has taken Tylenol but has attempted no other alleviating measures. He has numerous sick contacts at work.   Past Medical History:  Diagnosis Date  . DVT (deep venous thrombosis) (HCC)   . Hypertension     Patient Active Problem List   Diagnosis Date Noted  . 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 Prothrombin gene mutation (HCC) 07/11/2016  . Tobacco use disorder 07/01/2016  . 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 Accelerated hypertension 06/13/2016  . Chest pain 06/13/2016  . Left arm swelling 06/13/2016    Past Surgical History:  Procedure Laterality Date  . NO PAST SURGERIES      Prior to Admission medications   Medication Sig Start Date End Date Taking? Authorizing Provider  apixaban (ELIQUIS) 5 MG TABS tablet Take 1 tablet (5 mg total) by mouth 2 (two) times daily. 07/25/16   Rosey BathMelissa C Corcoran, MD  carvedilol (COREG) 12.5 MG tablet Take 1 tablet (12.5 mg total) by mouth 2 (two) times daily with a meal. 06/16/16   Shaune PollackQing Chen, MD  hydrochlorothiazide (HYDRODIURIL) 25 MG tablet Take 1 tablet (25 mg total) by mouth daily. 06/17/16   Shaune PollackQing Chen, MD  lisinopril (PRINIVIL,ZESTRIL) 10 MG tablet Take 1 tablet (10 mg total) by mouth daily. 06/17/16   Shaune PollackQing Chen, MD  oseltamivir (TAMIFLU) 75 MG capsule Take 1 capsule (75 mg  total) by mouth 2 (two) times daily. 08/03/16 08/08/16  Orvil FeilJaclyn M Woods, PA-C    Allergies Patient has no known allergies.  Family History  Problem Relation Age of Onset  . Hypertension Other   . Diabetes Maternal Grandmother     Social History Social History  Substance Use Topics  . Smoking status: Current Every Day Smoker    Packs/day: 0.50    Types: Cigarettes  . Smokeless tobacco: Never Used  . Alcohol use Yes     Review of Systems  Constitutional: Patient has had fever.  Eyes: No visual changes. No discharge ENT: Patient has had congestion.  Cardiovascular: no chest pain. Respiratory: Patient has had non-productive cough.  No SOB. Gastrointestinal: Patient has had nausea and diarrhea. Genitourinary: Negative for dysuria. No hematuria Musculoskeletal: Patient has had myalgias. Skin: Negative for rash, abrasions, lacerations, ecchymosis. Neurological: Patient has had headaches, no focal weakness or numbness.  ____________________________________________   PHYSICAL EXAM:  VITAL SIGNS: ED Triage Vitals  Enc Vitals Group     BP 08/03/16 1948 (!) 185/75     Pulse Rate 08/03/16 1948 96     Resp 08/03/16 1948 16     Temp 08/03/16 1948 (!) 103 F (39.4 C)     Temp Source 08/03/16 1948 Oral     SpO2 08/03/16 1948 96 %     Weight 08/03/16 1949 257 lb (116.6 kg)     Height 08/03/16 1949 5\' 10"  (1.778 m)     Head Circumference --      Peak Flow --  Pain Score 08/03/16 1949 5     Pain Loc --      Pain Edu? --      Excl. in GC? --      Constitutional: Alert and oriented. Patient is lying supine in bed. He is wearing facemask.  Eyes: Conjunctivae are normal. PERRL. EOMI. Head: Atraumatic. ENT:      Ears: Tympanic membranes are injected bilaterally without evidence of effusion or purulent exudate. Bony landmarks are visualized bilaterally. No pain with palpation at the tragus.      Nose: Nasal turbinates are edematous and erythematous. Copious rhinorrhea  visualized.      Mouth/Throat: Mucous membranes are moist. Posterior pharynx is mildly erythematous. No tonsillar hypertrophy or purulent exudate. Uvula is midline. Neck: Full range of motion. No pain is elicited with flexion at the neck. Hematological/Lymphatic/Immunilogical: No cervical lymphadenopathy. Cardiovascular: Normal rate, regular rhythm. Normal S1 and S2.  Good peripheral circulation. Respiratory: Normal respiratory effort without tachypnea or retractions. Lungs CTAB. Good air entry to the bases with no decreased or absent breath sounds. Gastrointestinal: Bowel sounds 4 quadrants. Soft and nontender to palpation. No guarding or rigidity. No palpable masses. No distention. No CVA tenderness.  Skin:  Skin is warm, dry and intact. No rash noted. Psychiatric: Mood and affect are normal. Speech and behavior are normal. Patient exhibits appropriate insight and judgement.   ____________________________________________   LABS (all labs ordered are listed, but only abnormal results are displayed)  Labs Reviewed  INFLUENZA PANEL BY PCR (TYPE A & B) - Abnormal; Notable for the following:       Result Value   Influenza A By PCR POSITIVE (*)    All other components within normal limits   ____________________________________________  EKG   ____________________________________________  RADIOLOGY   No results found.  ____________________________________________    PROCEDURES  Procedure(s) performed:    Procedures    Medications  acetaminophen (TYLENOL) tablet 650 mg (650 mg Oral Given 08/03/16 1957)     ____________________________________________   INITIAL IMPRESSION / ASSESSMENT AND PLAN / ED COURSE  Pertinent labs & imaging results that were available during my care of the patient were reviewed by me and considered in my medical decision making (see chart for details).  Review of the [place] CSRS was performed in accordance of the [facility] prior to dispensing any  controlled drugs.  Assessment and plan: Influenza A Patient presents to the emergency department with headache, malaise, congestion, fatigue, diarrhea and fever. Patient tested positive for influenza A in the emergency department. Rest and hydration was encouraged. Patient was advised to follow-up with his primary care provider in one week. Aside from hypertension, vital signs and physical exam are reassuring at this time. Patient was discharged with Tamiflu. All patient questions were answered.  ____________________________________________  FINAL CLINICAL IMPRESSION(S) / ED DIAGNOSES  Final diagnoses:  Influenza A      NEW MEDICATIONS STARTED DURING THIS VISIT:  Discharge Medication List as of 08/03/2016  9:47 PM    START taking these medications   Details  oseltamivir (TAMIFLU) 75 MG capsule Take 1 capsule (75 mg total) by mouth 2 (two) times daily., Starting Sun 08/03/2016, Until Fri 08/08/2016, Print            This chart was dictated using voice recognition software/Dragon. Despite best efforts to proofread, errors can occur which can change the meaning. Any change was purely unintentional.    Orvil Feil, PA-C 08/04/16 1433    Nita Sickle, MD 08/05/16 [phone]

## 2016-08-15 ENCOUNTER — Inpatient Hospital Stay: Payer: Self-pay | Admitting: Hematology and Oncology

## 2016-08-15 NOTE — Progress Notes (Deleted)
Ricky Leonard-  Cancer Leonard  Clinic day:  08/15/2016  Chief Complaint: Ricky Leonard is a 36 y.o. 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  HPI:   The patient was last seen in the hematology clinic on 07/18/2016.  At that time, he was seen for assessment after recent hospitalization with accelerated hypertension, chest pain, shortness of breath, and left arm swelling.  Chest CT revealed no evidence of thoracic aortic aneurysm or dissection.  There was a hazy density in anterior mediastinum surrounding left innominate vein, and tracking peripherally along the left subclavian and axillary veins. Left upper extremity duplex revealed an acute occlusive thrombus within the left internal jugular vein.  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   At last visit, he denied any complaints except for blurry vision.  Hypertension was poorly controlled secondary to medication non-compliance.  He did not have a PCP.  He was trying to get into the [facility].  He was running out of his Eliquis (10-14 days left).  He was referred to the ER for his blood pressure (220/115).  Smoking cessation was encouraged.  Assistance was provided for his anticoagulation.    Past Medical History:  Diagnosis Date  . DVT (deep venous thrombosis) (HCC)   . Hypertension     Past Surgical History:  Procedure Laterality Date  . NO PAST SURGERIES      Family History  Problem Relation Age of Onset  . Hypertension Other   . Diabetes Maternal Grandmother     Social History:  reports that he has been smoking Cigarettes.  He has been smoking about 0.50 packs per day. He has never used smokeless tobacco. He reports that he drinks alcohol. He reports that he does not use drugs.  He has smoked half a pack a day for the past 9 years.  He has used marijuana. He  denies any alcohol use.  He is unemployed.  He is accompanied by his wife, Ricky Leonard, today.  Allergies: No Known Allergies  Current Medications: Current Outpatient Prescriptions  Medication Sig Dispense Refill  . apixaban (ELIQUIS) 5 MG TABS tablet Take 1 tablet (5 mg total) by mouth 2 (two) times daily. 60 tablet 11  . carvedilol (COREG) 12.5 MG tablet Take 1 tablet (12.5 mg total) by mouth 2 (two) times daily with a meal. 60 tablet 2  . hydrochlorothiazide (HYDRODIURIL) 25 MG tablet Take 1 tablet (25 mg total) by mouth daily. 30 tablet 2  . lisinopril (PRINIVIL,ZESTRIL) 10 MG tablet Take 1 tablet (10 mg total) by mouth daily. 30 tablet 2   No current facility-administered medications for this visit.     Review of Systems:  GENERAL:  Feels "ok".  Getting over a cold.  No fevers or sweats.  Weight stable. PERFORMANCE STATUS (ECOG):  1 HEENT:  Visual changes.  No runny nose, sore throat, mouth sores or tenderness. Lungs:  No shortness of breath or cough.  No hemoptysis. Cardiac:  No chest pain, palpitations, orthopnea, or PND.  Hypertension, did not take medications today. GI:  No nausea, vomiting, diarrhea, constipation, melena or hematochezia. GU:  No urgency, frequency, dysuria, or hematuria. Musculoskeletal:  Bilateral shoulder and back pain, better.  No joint pain.  No muscle tenderness. Extremities:  Left upper extremity edema, improving.  No pain. Skin:  No rashes or skin changes. Neuro:  No headache, numbness  or weakness, balance or coordination issues. Endocrine:  No diabetes, thyroid issues, hot flashes or night sweats. Psych:  No mood changes, depression or anxiety. Pain:  No focal pain. Review of systems:  All other systems reviewed and found to be negative.  Physical Exam: There were no vitals taken for this visit. GENERAL:  Well developed, well nourished, gentleman sitting comfortably in the exam room in no acute distress. MENTAL STATUS:  Alert and oriented to person,  place and time. HEAD:  Wearing a cap.  Brown hair with beard.  Normocephalic, atraumatic, face symmetric, no Cushingoid features. EYES:  Brown eyes.  Pupils equal round and reactive to light and accomodation.  No conjunctivitis or scleral icterus. ENT:  Oropharynx clear without lesion.  Tongue normal. Mucous membranes moist.  RESPIRATORY:  Clear to auscultation without rales, wheezes or rhonchi. CARDIOVASCULAR:  Regular rate and rhythm without murmur, rub or gallop. ABDOMEN:  Soft, non-tender, with active bowel sounds, and no appreciable hepatosplenomegaly.  No masses. SKIN:  No rashes, ulcers or lesions. EXTREMITIES: Subtle left upper extremity edema.  No lower extremity edema, no skin discoloration or tenderness.  No palpable cords. LYMPH NODES: No palpable cervical, supraclavicular, axillary or inguinal adenopathy  NEUROLOGICAL: Unremarkable. PSYCH:  Appropriate.   No visits with results within 3 Day(s) from this visit.  Latest known visit with results is:  Admission on 08/03/2016, Discharged on 08/03/2016  Component Date Value Ref Range Status  . Influenza A By PCR 08/03/2016 POSITIVE* NEGATIVE Final  . Influenza B By PCR 08/03/2016 NEGATIVE  NEGATIVE Final   Comment: (NOTE) The Xpert Xpress Flu assay is intended as an aid in the diagnosis of  influenza and should not be used as a sole basis for treatment.  This  assay is FDA approved for nasopharyngeal swab specimens only. Nasal  washings and aspirates are unacceptable for Xpert Xpress Flu testing.     Assessment:  Ricky Leonard is a 36 y.o. 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  Clot occurred after moving/lifting totes.  He is on Eliquis.  He has no history of prior thrombosis.  He denies any left upper extremity IV access or drug use.  He smokes.  He has no symptoms worrisome for malignancy.  CBC was normal.  He has no family history of  thrombosis.  Work-up on 06/15/2016 revealed heterozygosity for the prothrombin gene mutation (single [id number]-A mutation in the factor II gene).  The following studies were negative: factor V Leiden, lupus anticoagulant panel, anticardiolipin antibodies, beta-2 glycoprotein antibodies.  Symptomatically, he denies any complaints except for blurry vision.  Hypertension is poorly controlled secondary to medication non-compliance.  Plan:   1.  Discuss hypercoagulable work-up from hospitalization.  Discuss prothrombin gene mutation.  Discuss additional hypercoagulable work-up to ensure no other risk factors.  Discuss continuation of Eliquis for a minimum of 6 months.   2.  Assist with patient obtaining Eliquis (runs out in 10-14 days). 3.  Discuss poorly controlled hypertension.  Patient to be seen in ER today. 4.  Encourage smoking cessation. 5.  Patient to follow-up with physicians from [facility]. 6.  RTC in 14 days for labs (CBC with diff, CMP, protein C activity/antigen, protein S activity/antigen, ATIII antigen/activity). 7.  RTC in 1 month for MD assessment.   Rosey BathMelissa C Corcoran, MD  08/15/2016, 6:16 AM

## 2016-08-18 ENCOUNTER — Inpatient Hospital Stay: Payer: Self-pay | Admitting: Hematology and Oncology

## 2016-08-18 ENCOUNTER — Telehealth: Payer: Self-pay | Admitting: Pharmacist

## 2016-08-18 NOTE — Telephone Encounter (Signed)
Eliquis/Bristal Myers PAP submitted to manufacturer today.

## 2016-08-18 NOTE — Progress Notes (Deleted)
[facility]-  Cancer Center  Clinic day:  08/18/2016  Chief Complaint: Ricky Leonard is a 36 y.o. 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  HPI:   The patient was last seen in the hematology clinic on 07/18/2016.  At that time, he was seen for assessment after recent hospitalization with accelerated hypertension, chest pain, shortness of breath, and left arm swelling.  Chest CT revealed no evidence of thoracic aortic aneurysm or dissection.  There was a hazy density in anterior mediastinum surrounding left innominate vein, and tracking peripherally along the left subclavian and axillary veins. Left upper extremity duplex revealed an acute occlusive thrombus within the left internal jugular vein.  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   At last visit, he denied any complaints except for blurry vision.  Hypertension was poorly controlled secondary to medication non-compliance.  He did not have a PCP.  He was trying to get into the [facility].  He was running out of his Eliquis (10-14 days left).  He was referred to the ER for his blood pressure (220/115).  Smoking cessation was encouraged.  Assistance was provided for his anticoagulation.  Labs on 08/01/2016 revealed a normal CBC with diff.  CMP revealed a creatinine of 1.30.  Protein S activity was 89%.  Protein C activity was 127%.  Protein C antigen was 84%.  ATIII antigen was 92%.  Influenza A was positive on 08/03/2016.    Past Medical History:  Diagnosis Date  . DVT (deep venous thrombosis) (HCC)   . Hypertension     Past Surgical History:  Procedure Laterality Date  . NO PAST SURGERIES      Family History  Problem Relation Age of Onset  . Hypertension Other   . Diabetes Maternal Grandmother     Social History:  reports that he has been smoking  Cigarettes.  He has been smoking about 0.50 packs per day. He has never used smokeless tobacco. He reports that he drinks alcohol. He reports that he does not use drugs.  He has smoked half a pack a day for the past 9 years.  He has used marijuana. He denies any alcohol use.  He is unemployed.  He is accompanied by his wife, Ricky Leonard, today.  Allergies: No Known Allergies  Current Medications: Current Outpatient Prescriptions  Medication Sig Dispense Refill  . apixaban (ELIQUIS) 5 MG TABS tablet Take 1 tablet (5 mg total) by mouth 2 (two) times daily. 60 tablet 11  . carvedilol (COREG) 12.5 MG tablet Take 1 tablet (12.5 mg total) by mouth 2 (two) times daily with a meal. 60 tablet 2  . hydrochlorothiazide (HYDRODIURIL) 25 MG tablet Take 1 tablet (25 mg total) by mouth daily. 30 tablet 2  . lisinopril (PRINIVIL,ZESTRIL) 10 MG tablet Take 1 tablet (10 mg total) by mouth daily. 30 tablet 2   No current facility-administered medications for this visit.     Review of Systems:  GENERAL:  Feels "ok".  Getting over a cold.  No fevers or sweats.  Weight stable. PERFORMANCE STATUS (ECOG):  1 HEENT:  Visual changes.  No runny nose, sore throat, mouth sores or tenderness. Lungs:  No shortness of breath or cough.  No hemoptysis. Cardiac:  No chest pain, palpitations, orthopnea, or PND.  Hypertension, did not take medications today. GI:  No nausea, vomiting, diarrhea, constipation, melena or  hematochezia. GU:  No urgency, frequency, dysuria, or hematuria. Musculoskeletal:  Bilateral shoulder and back pain, better.  No joint pain.  No muscle tenderness. Extremities:  Left upper extremity edema, improving.  No pain. Skin:  No rashes or skin changes. Neuro:  No headache, numbness or weakness, balance or coordination issues. Endocrine:  No diabetes, thyroid issues, hot flashes or night sweats. Psych:  No mood changes, depression or anxiety. Pain:  No focal pain. Review of systems:  All other systems  reviewed and found to be negative.  Physical Exam: There were no vitals taken for this visit. GENERAL:  Well developed, well nourished, gentleman sitting comfortably in the exam room in no acute distress. MENTAL STATUS:  Alert and oriented to person, place and time. HEAD:  Wearing a cap.  Brown hair with beard.  Normocephalic, atraumatic, face symmetric, no Cushingoid features. EYES:  Brown eyes.  Pupils equal round and reactive to light and accomodation.  No conjunctivitis or scleral icterus. ENT:  Oropharynx clear without lesion.  Tongue normal. Mucous membranes moist.  RESPIRATORY:  Clear to auscultation without rales, wheezes or rhonchi. CARDIOVASCULAR:  Regular rate and rhythm without murmur, rub or gallop. ABDOMEN:  Soft, non-tender, with active bowel sounds, and no appreciable hepatosplenomegaly.  No masses. SKIN:  No rashes, ulcers or lesions. EXTREMITIES: Subtle left upper extremity edema.  No lower extremity edema, no skin discoloration or tenderness.  No palpable cords. LYMPH NODES: No palpable cervical, supraclavicular, axillary or inguinal adenopathy  NEUROLOGICAL: Unremarkable. PSYCH:  Appropriate.   No visits with results within 3 Day(s) from this visit.  Latest known visit with results is:  Admission on 08/03/2016, Discharged on 08/03/2016  Component Date Value Ref Range Status  . Influenza A By PCR 08/03/2016 POSITIVE* NEGATIVE Final  . Influenza B By PCR 08/03/2016 NEGATIVE  NEGATIVE Final   Comment: (NOTE) The Xpert Xpress Flu assay is intended as an aid in the diagnosis of  influenza and should not be used as a sole basis for treatment.  This  assay is FDA approved for nasopharyngeal swab specimens only. Nasal  washings and aspirates are unacceptable for Xpert Xpress Flu testing.     Assessment:  Ricky Leonard is a 36 y.o. 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  Clot occurred  after moving/lifting totes.  He is on Eliquis.  He has no history of prior thrombosis.  He denies any left upper extremity IV access or drug use.  He smokes.  He has no symptoms worrisome for malignancy.  CBC was normal.  He has no family history of thrombosis.  Work-up on 06/15/2016 revealed heterozygosity for the prothrombin gene mutation (single G-20210-A mutation in the factor II gene).  The following studies were negative: factor V Leiden, lupus anticoagulant panel, anticardiolipin antibodies, beta-2 glycoprotein antibodies.  Symptomatically, he denies any complaints except for blurry vision.  Hypertension is poorly controlled secondary to medication non-compliance.  Plan:   1.  Discuss hypercoagulable work-up from hospitalization.  Discuss prothrombin gene mutation.  Discuss additional hypercoagulable work-up to ensure no other risk factors.  Discuss continuation of Eliquis for a minimum of 6 months.   2.  Assist with patient obtaining Eliquis (runs out in 10-14 days). 3.  Discuss poorly controlled hypertension.  Patient to be seen in ER today. 4.  Encourage smoking cessation. 5.  Patient to follow-up with physicians from [facility]. 6.  RTC in 14 days for labs (  CBC with diff, CMP, protein C activity/antigen, protein S activity/antigen, ATIII antigen/activity). 7.  RTC in 1 month for MD assessment.   Rosey Bath, MD  08/18/2016, 5:57 AM

## 2016-08-26 ENCOUNTER — Encounter (INDEPENDENT_AMBULATORY_CARE_PROVIDER_SITE_OTHER): Payer: Self-pay

## 2016-08-26 ENCOUNTER — Inpatient Hospital Stay: Payer: Medicaid Other | Attending: Hematology and Oncology | Admitting: Hematology and Oncology

## 2016-08-26 VITALS — BP 164/115 | HR 61 | Temp 95.0°F | Resp 18 | Wt 243.6 lb

## 2016-08-26 DIAGNOSIS — Z7901 Long term (current) use of anticoagulants: Secondary | ICD-10-CM | POA: Insufficient documentation

## 2016-08-26 DIAGNOSIS — Z9114 Patient's other noncompliance with medication regimen: Secondary | ICD-10-CM | POA: Insufficient documentation

## 2016-08-26 DIAGNOSIS — Z8619 Personal history of other infectious and parasitic diseases: Secondary | ICD-10-CM | POA: Diagnosis not present

## 2016-08-26 DIAGNOSIS — Z79899 Other long term (current) drug therapy: Secondary | ICD-10-CM | POA: Insufficient documentation

## 2016-08-26 DIAGNOSIS — D6852 Prothrombin gene mutation: Secondary | ICD-10-CM

## 2016-08-26 DIAGNOSIS — H538 Other visual disturbances: Secondary | ICD-10-CM | POA: Insufficient documentation

## 2016-08-26 DIAGNOSIS — F17210 Nicotine dependence, cigarettes, uncomplicated: Secondary | ICD-10-CM | POA: Diagnosis not present

## 2016-08-26 DIAGNOSIS — I82B12 Acute embolism and thrombosis of left subclavian vein: Secondary | ICD-10-CM | POA: Diagnosis not present

## 2016-08-26 DIAGNOSIS — I10 Essential (primary) hypertension: Secondary | ICD-10-CM | POA: Insufficient documentation

## 2016-08-26 DIAGNOSIS — R0781 Pleurodynia: Secondary | ICD-10-CM | POA: Diagnosis not present

## 2016-08-26 DIAGNOSIS — I82C12 Acute embolism and thrombosis of left internal jugular vein: Secondary | ICD-10-CM | POA: Insufficient documentation

## 2016-08-26 NOTE — Progress Notes (Signed)
[facility]-  Cancer Center  Clinic day:  08/26/2016  Chief Complaint: Ricky Leonard is a 36 y.o. 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  HPI:   The patient was last seen in the hematology clinic on 07/18/2016.  At that time, he was seen for assessment after recent hospitalization with accelerated hypertension, chest pain, shortness of breath, and left arm swelling.  Chest CT revealed no evidence of thoracic aortic aneurysm or dissection.  There was a hazy density in anterior mediastinum surrounding left innominate vein, and tracking peripherally along the left subclavian and axillary veins. Left upper extremity duplex revealed an acute occlusive thrombus within the left internal jugular vein.  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   At last visit, he denied any complaints except for blurry vision.  Hypertension was poorly controlled secondary to medication non-compliance.  He did not have a PCP.  He was trying to get into the [facility].  He was running out of his Eliquis (10-14 days left).  He was referred to the ER for his blood pressure (220/115).  Smoking cessation was encouraged.  Assistance was provided for his anticoagulation.  Labs on 08/01/2016 revealed a normal CBC with diff.  CMP revealed a creatinine of 1.30.  Protein S activity was 89%.  Protein C activity was 127%.  Protein C antigen was 84%.  ATIII antigen was 92%.  Influenza A was positive on 08/03/2016.  During the interim, he was treated for Influenza A.  He is feeling much better. He still complains of intermittent blurry vision. He was seen in the ER for hypertension and placed on several BP medications. He states he takes them daily after breakfast but he did not take them this morning. He has not found a PCP at this time. He forgot to [facility] last month for follow-up for his hypertension.   He continues to take Eliquis as prescribed.  He has pre-approval for the medication through 07/2017. He was also just approved for Medicaid. He denies any swelling in extremities, SOB or chest pain. He continues to smoke half a pack of cigarettes per day.    Past Medical History:  Diagnosis Date  . DVT (deep venous thrombosis) (HCC)   . Hypertension     Past Surgical History:  Procedure Laterality Date  . NO PAST SURGERIES      Family History  Problem Relation Age of Onset  . Hypertension Other   . Diabetes Maternal Grandmother     Social History:  reports that he has been smoking Cigarettes.  He has been smoking about 0.50 packs per day. He has never used smokeless tobacco. He reports that he drinks alcohol. He reports that he does not use drugs.  He has smoked half a pack a day for the past 9 years.  He has used marijuana. He denies any alcohol use.  He is unemployed.  He is accompanied by his wife, Ricky Leonard, today.  Allergies: No Known Allergies  Current Medications: Current Outpatient Prescriptions  Medication Sig Dispense Refill  . apixaban (ELIQUIS) 5 MG TABS tablet Take 1 tablet (5 mg total) by mouth 2 (two) times daily. 60 tablet 11  . carvedilol (COREG) 12.5 MG tablet Take 1 tablet (12.5 mg total) by mouth 2 (two) times daily with a meal. 60 tablet 2  . hydrochlorothiazide (  HYDRODIURIL) 25 MG tablet Take 1 tablet (25 mg total) by mouth daily. 30 tablet 2  . lisinopril (PRINIVIL,ZESTRIL) 10 MG tablet Take 1 tablet (10 mg total) by mouth daily. 30 tablet 2   No current facility-administered medications for this visit.     Review of Systems:  GENERAL:  Feels "ok".  Getting over a influenza.  No fevers or sweats.  Weight down 14 pounds. PERFORMANCE STATUS (ECOG):  1 HEENT:  Blurry vision.  No runny nose, sore throat, mouth sores or tenderness. Lungs:  No shortness of breath or cough.  No hemoptysis. Cardiac:   No chest pain, palpitations, orthopnea, or PND.  Hypertension, did not take medications today. GI:  No nausea, vomiting, diarrhea, constipation, melena or hematochezia. GU:  No urgency, frequency, dysuria, or hematuria. Musculoskeletal:  Bilateral shoulder and back pain, better.  Left rib pain. Denies injury.  No muscle tenderness. Extremities:  Left upper extremity edema, improving.  No pain. Skin:  No rashes or skin changes. Neuro:  No headache, numbness or weakness, balance or coordination issues. Endocrine:  No diabetes, thyroid issues, hot flashes or night sweats. Psych:  No mood changes, depression or anxiety. Pain:  No focal pain. Review of systems:  All other systems reviewed and found to be negative.  Physical Exam: Blood pressure (!) 164/115, pulse 61, temperature (!) 95 F (35 C), temperature source Tympanic, resp. rate 18, weight 243 lb 9.7 oz (110.5 kg). GENERAL:  Well developed, well nourished, gentleman sitting comfortably in the exam room in no acute distress. MENTAL STATUS:  Alert and oriented to person, place and time. HEAD:  Wearing a cap.  Brown hair with beard.  Normocephalic, atraumatic, face symmetric, no Cushingoid features. EYES:  Brown eyes.  Pupils equal round and reactive to light and accomodation.  No conjunctivitis or scleral icterus. ENT:  Oropharynx clear without lesion.  Tongue normal. Mucous membranes moist.  RESPIRATORY:  Clear to auscultation without rales, wheezes or rhonchi. CARDIOVASCULAR:  Regular rate and rhythm without murmur, rub or gallop. ABDOMEN:  Soft, non-tender, with active bowel sounds, and no appreciable hepatosplenomegaly.  No masses. SKIN:  No rashes, ulcers or lesions. EXTREMITIES: Subtle left upper extremity edema.  No lower extremity edema, no skin discoloration or tenderness.  No palpable cords. LYMPH NODES: No palpable cervical, supraclavicular, axillary or inguinal adenopathy  NEUROLOGICAL: Unremarkable. PSYCH:   Appropriate.   No visits with results within 3 Day(s) from this visit.  Latest known visit with results is:  Admission on 08/03/2016, Discharged on 08/03/2016  Component Date Value Ref Range Status  . Influenza A By PCR 08/03/2016 POSITIVE* NEGATIVE Final  . Influenza B By PCR 08/03/2016 NEGATIVE  NEGATIVE Final   Comment: (NOTE) The Xpert Xpress Flu assay is intended as an aid in the diagnosis of  influenza and should not be used as a sole basis for treatment.  This  assay is FDA approved for nasopharyngeal swab specimens only. Nasal  washings and aspirates are unacceptable for Xpert Xpress Flu testing.     Assessment:  Rahiem A Hagin is a 36 y.o. 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  Clot occurred after moving/lifting totes.  He is on Eliquis.  He has no history of prior thrombosis.  He denies any left upper extremity IV access or drug use.  He smokes.  He has no symptoms worrisome for malignancy.  CBC was normal.  He has no family history of thrombosis.  Work-up on 06/15/2016 revealed heterozygosity for the prothrombin gene mutation (single G-20210-A mutation in the factor II gene).  The following studies were negative: factor V Leiden, lupus anticoagulant panel, anticardiolipin antibodies, beta-2 glycoprotein antibodies.  Labs on 08/01/2016 revealed a normal CBC, CMP, Protein S activity, Protein C activity/antigen, and ATIII antigen.  Symptomatically, he has occasional blurry vision and left sided rib pain.  Hypertension is poorly controlled secondary to medication non-compliance.  Plan:   1.  Continue Eliquis for a total of 6 months. 2.  Discussed poorly controlled hypertension.  Patient to be seen in ER today. [facility] does not open today until 4:15pm.  3.  Encourage smoking cessation. 4.  Patient to find and follow-up with a PCP. He was approved for Medicaid.  5.  Anticipate  follow-up prior to discontinuation of anticoagulation in the vascular surgery clinic. 6.  RTC in 2 month for MD assessment and labs (CBC with diff, BMP, AT III activity, protein S antigen).  The patient was seen and examined.  I discussed the importance of blood pressure control.  The assessment and plan was discussed with the patient.  A few questions were asked and answered.  Durenda Hurt, NP  Rosey Bath, MD 08/26/2016, 2:17 PM

## 2016-08-26 NOTE — Progress Notes (Signed)
Patient states he is having pain in his left ribcage if he coughs or takes a deep breath.  BP 181/121 HR 61  States he has not taken BP meds this morning.  Recheck 164/115 HR61.

## 2016-08-27 ENCOUNTER — Other Ambulatory Visit: Payer: Self-pay | Admitting: *Deleted

## 2016-08-27 DIAGNOSIS — I82B12 Acute embolism and thrombosis of left subclavian vein: Secondary | ICD-10-CM

## 2016-09-16 ENCOUNTER — Encounter: Payer: Self-pay | Admitting: Hematology and Oncology

## 2016-09-17 ENCOUNTER — Ambulatory Visit (INDEPENDENT_AMBULATORY_CARE_PROVIDER_SITE_OTHER): Payer: Medicaid Other

## 2016-09-17 ENCOUNTER — Ambulatory Visit (INDEPENDENT_AMBULATORY_CARE_PROVIDER_SITE_OTHER): Payer: Medicaid Other | Admitting: Vascular Surgery

## 2016-09-17 DIAGNOSIS — I82890 Acute embolism and thrombosis of other specified veins: Secondary | ICD-10-CM | POA: Diagnosis not present

## 2016-09-26 ENCOUNTER — Encounter (INDEPENDENT_AMBULATORY_CARE_PROVIDER_SITE_OTHER): Payer: Self-pay

## 2016-10-24 ENCOUNTER — Inpatient Hospital Stay (HOSPITAL_BASED_OUTPATIENT_CLINIC_OR_DEPARTMENT_OTHER): Payer: Medicaid Other | Admitting: Hematology and Oncology

## 2016-10-24 ENCOUNTER — Inpatient Hospital Stay: Payer: Medicaid Other | Attending: Hematology and Oncology

## 2016-10-24 VITALS — BP 137/93 | HR 59 | Temp 97.6°F | Resp 18 | Wt 246.2 lb

## 2016-10-24 DIAGNOSIS — I82890 Acute embolism and thrombosis of other specified veins: Secondary | ICD-10-CM

## 2016-10-24 DIAGNOSIS — F17210 Nicotine dependence, cigarettes, uncomplicated: Secondary | ICD-10-CM | POA: Diagnosis not present

## 2016-10-24 DIAGNOSIS — I10 Essential (primary) hypertension: Secondary | ICD-10-CM | POA: Insufficient documentation

## 2016-10-24 DIAGNOSIS — Z79899 Other long term (current) drug therapy: Secondary | ICD-10-CM | POA: Diagnosis not present

## 2016-10-24 DIAGNOSIS — D6852 Prothrombin gene mutation: Secondary | ICD-10-CM | POA: Diagnosis not present

## 2016-10-24 DIAGNOSIS — I82B12 Acute embolism and thrombosis of left subclavian vein: Secondary | ICD-10-CM | POA: Diagnosis not present

## 2016-10-24 DIAGNOSIS — Z7901 Long term (current) use of anticoagulants: Secondary | ICD-10-CM

## 2016-10-24 LAB — COMPREHENSIVE METABOLIC PANEL
ALT: 20 U/L (ref 17–63)
AST: 35 U/L (ref 15–41)
Albumin: 4.2 g/dL (ref 3.5–5.0)
Alkaline Phosphatase: 53 U/L (ref 38–126)
Anion gap: 6 (ref 5–15)
BUN: 19 mg/dL (ref 6–20)
CO2: 28 mmol/L (ref 22–32)
Calcium: 9.6 mg/dL (ref 8.9–10.3)
Chloride: 103 mmol/L (ref 101–111)
Creatinine, Ser: 1.44 mg/dL — ABNORMAL HIGH (ref 0.61–1.24)
GFR calc Af Amer: >60 mL/min (ref >60)
GFR calc non Af Amer: >60 mL/min (ref >60)
Glucose, Bld: 132 mg/dL — ABNORMAL HIGH (ref 65–99)
Potassium: 3.6 mmol/L (ref 3.5–5.1)
Sodium: 137 mmol/L (ref 135–145)
Total Bilirubin: 0.8 mg/dL (ref 0.3–1.2)
Total Protein: 7.2 g/dL (ref 6.5–8.1)

## 2016-10-24 LAB — ANTITHROMBIN III: AntiThromb III Func: 101 % (ref 75–120)

## 2016-10-24 NOTE — Progress Notes (Signed)
[place] Clinic day:  10/24/2016  Chief Complaint: Ricky Leonard is a 36 y.o. 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  HPI:   The patient was last seen in the hematology clinic on 08/26/2016.  At that time, he had occasional blurry vision and left sided rib pain.  Hypertension was poorly controlled secondary to medication non-compliance.Smoking cessation was encouraged.  He was sent to the ER for his blood pressure.  Upper extremity venous duplex on 09/19/2016 by Dr. 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  There was no superficial thrombophlebitis.  Symptomatically, he denies any new complaints. He remains on Eliquis. He is still smoking one half pack a day.   Past Medical History:  Diagnosis Date  . DVT (deep venous thrombosis) ([place])   . Hypertension     Past Surgical History:  Procedure Laterality Date  . NO PAST SURGERIES      Family History  Problem Relation Age of Onset  . Hypertension Other   . Diabetes Maternal Grandmother     Social History:  reports that he has been smoking Cigarettes.  He has been smoking about 0.50 packs per day. He has never used smokeless tobacco. He reports that he drinks alcohol. He reports that he does not use drugs.  He has smoked half a pack a day for the past 9 years.  He has used marijuana. He denies any alcohol use.  He is unemployed.  His wife's name is Candace.  He is alone today.  Allergies: No Known Allergies  Current Medications: Current Outpatient Prescriptions  Medication Sig Dispense Refill  . apixaban (ELIQUIS) 5 MG TABS tablet Take 1 tablet (5 mg total) by mouth 2 (two) times daily. 60 tablet 11  . carvedilol (COREG) 12.5 MG tablet Take 1 tablet (12.5 mg total) by mouth 2 (two) times daily with a meal. 60 tablet 2  .  hydrochlorothiazide (HYDRODIURIL) 25 MG tablet Take 1 tablet (25 mg total) by mouth daily. 30 tablet 2  . lisinopril (PRINIVIL,ZESTRIL) 10 MG tablet Take 1 tablet (10 mg total) by mouth daily. 30 tablet 2   No current facility-administered medications for this visit.     Review of Systems:  GENERAL:  Feels "fine". No fevers or sweats.  Weight up 3 pounds. PERFORMANCE STATUS (ECOG):  1 HEENT:  Blurry vision.  No runny nose, sore throat, mouth sores or tenderness. Lungs:  No shortness of breath or cough.  No hemoptysis. Cardiac:  No chest pain, palpitations, orthopnea, or PND.  Hypertension, did not take medications today. GI:  No nausea, vomiting, diarrhea, constipation, melena or hematochezia. GU:  No urgency, frequency, dysuria, or hematuria. Musculoskeletal:  No back or joint pain. No muscle tenderness. Extremities:  Left upper extremity edema, unchanged.  No pain. Skin:  No rashes or skin changes. Neuro:  No headache, numbness or weakness, balance or coordination issues. Endocrine:  No diabetes, thyroid issues, hot flashes or night sweats. Psych:  No mood changes, depression or anxiety. Pain:  No focal pain. Review of systems:  All other systems reviewed and found to be negative.  Physical Exam: Blood pressure (!) 137/93, pulse (!) 59, temperature 97.6 F (36.4 C), temperature source Tympanic, resp. rate 18, weight 246 lb 3 oz (111.7 kg). GENERAL:  Well developed, well nourished, gentleman sitting comfortably in the exam  room in no acute distress. MENTAL STATUS:  Alert and oriented to person, place and time. HEAD:  Shaved head with a beard.  Normocephalic, atraumatic, face symmetric, no Cushingoid features. EYES:  Brown eyes.  Pupils equal round and reactive to light and accomodation.  No conjunctivitis or scleral icterus. ENT:  Oropharynx clear without lesion.  Tongue normal. Mucous membranes moist.  RESPIRATORY:  Clear to auscultation without rales, wheezes or  rhonchi. CARDIOVASCULAR:  Regular rate and rhythm without murmur, rub or gallop. ABDOMEN:  Soft, non-tender, with active bowel sounds, and no appreciable hepatosplenomegaly.  No masses. SKIN:  No rashes, ulcers or lesions. EXTREMITIES: Mild left upper extremity edema.  No lower extremity edema, no skin discoloration or tenderness.  No palpable cords. LYMPH NODES: No palpable cervical, supraclavicular, axillary or inguinal adenopathy  NEUROLOGICAL: Unremarkable. PSYCH:  Appropriate.   Clinical Support on 10/24/2016  Component Date Value Ref Range Status  . Sodium 10/24/2016 137  135 - 145 mmol/L Final  . Potassium 10/24/2016 3.6  3.5 - 5.1 mmol/L Final  . Chloride 10/24/2016 103  101 - 111 mmol/L Final  . CO2 10/24/2016 28  22 - 32 mmol/L Final  . Glucose, Bld 10/24/2016 132* 65 - 99 mg/dL Final  . BUN 10/24/2016 19  6 - 20 mg/dL Final  . Creatinine, Ser 10/24/2016 1.44* 0.61 - 1.24 mg/dL Final  . Calcium 10/24/2016 9.6  8.9 - 10.3 mg/dL Final  . Total Protein 10/24/2016 7.2  6.5 - 8.1 g/dL Final  . Albumin 10/24/2016 4.2  3.5 - 5.0 g/dL Final  . AST 10/24/2016 35  15 - 41 U/L Final  . ALT 10/24/2016 20  17 - 63 U/L Final  . Alkaline Phosphatase 10/24/2016 53  38 - 126 U/L Final  . Total Bilirubin 10/24/2016 0.8  0.3 - 1.2 mg/dL Final  . GFR calc non Af Amer 10/24/2016 >60  >60 mL/min Final  . GFR calc Af Amer 10/24/2016 >60  >60 mL/min Final   Comment: (NOTE) The eGFR has been calculated using the CKD EPI equation. This calculation has not been validated in all clinical situations. eGFR's persistently <60 mL/min signify possible Chronic Kidney Disease.   . Anion gap 10/24/2016 6  5 - 15 Final    Assessment:  Ricky Leonard is a 36 y.o. 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  Clot occurred after moving/lifting totes.  He is on Eliquis.  He has no history of prior thrombosis.  He denies any  left upper extremity IV access or drug use.  He smokes.  He has no symptoms worrisome for malignancy.  CBC was normal.  He has no family history of thrombosis.  Work-up on 06/15/2016 revealed heterozygosity for the prothrombin gene mutation (single G-20210-A mutation in the factor II gene).  The following studies were negative: factor V Leiden, lupus anticoagulant panel, anticardiolipin antibodies, beta-2 glycoprotein antibodies.  Labs on 08/01/2016 revealed a normal CBC, CMP, Protein S activity, Protein C activity/antigen, and ATIII antigen.  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  There was no superficial thrombophlebitis.  Symptomatically, he denies any new complaints.  Exam is stable.  He continues to smoke.  Plan:   1.  Labs today:  CMP, AT III activity, protein S antigen. 2.  Continue Eliquis. 3.  Encourage smoking cessation. 4.  Discuss blood pressure. 5.  Follow-up as scheduled  in the vascular surgery clinic. 6.  RTC in 3 months for MD assessment.   Lequita Asal, MD 10/24/2016, 3:35 PM

## 2016-10-24 NOTE — Progress Notes (Signed)
Patient offers no concerns today. 

## 2016-10-25 LAB — PROTEIN S ACTIVITY: Protein S Activity: 64 % (ref 63–140)

## 2016-11-20 ENCOUNTER — Encounter: Payer: Self-pay | Admitting: Hematology and Oncology

## 2016-12-19 ENCOUNTER — Encounter (INDEPENDENT_AMBULATORY_CARE_PROVIDER_SITE_OTHER): Payer: Self-pay

## 2016-12-19 ENCOUNTER — Ambulatory Visit (INDEPENDENT_AMBULATORY_CARE_PROVIDER_SITE_OTHER): Payer: Medicaid Other | Admitting: Vascular Surgery

## 2017-01-23 ENCOUNTER — Ambulatory Visit
Admission: RE | Admit: 2017-01-23 | Discharge: 2017-01-23 | Disposition: A | Discharge status: Home / Self Care | Payer: Medicaid Other | Source: Ambulatory Visit | Attending: Hematology and Oncology | Admitting: Hematology and Oncology

## 2017-01-23 ENCOUNTER — Encounter: Payer: Self-pay | Admitting: Hematology and Oncology

## 2017-01-23 ENCOUNTER — Inpatient Hospital Stay: Payer: Medicaid Other | Attending: Hematology and Oncology | Admitting: Hematology and Oncology

## 2017-01-23 VITALS — BP 134/89 | HR 65 | Temp 96.4°F | Resp 18 | Wt 244.1 lb

## 2017-01-23 DIAGNOSIS — D6852 Prothrombin gene mutation: Secondary | ICD-10-CM | POA: Diagnosis not present

## 2017-01-23 DIAGNOSIS — I10 Essential (primary) hypertension: Secondary | ICD-10-CM | POA: Diagnosis not present

## 2017-01-23 DIAGNOSIS — Z79899 Other long term (current) drug therapy: Secondary | ICD-10-CM

## 2017-01-23 DIAGNOSIS — I82B12 Acute embolism and thrombosis of left subclavian vein: Secondary | ICD-10-CM | POA: Diagnosis not present

## 2017-01-23 DIAGNOSIS — R05 Cough: Secondary | ICD-10-CM | POA: Insufficient documentation

## 2017-01-23 DIAGNOSIS — Z7901 Long term (current) use of anticoagulants: Secondary | ICD-10-CM

## 2017-01-23 DIAGNOSIS — R059 Cough, unspecified: Secondary | ICD-10-CM

## 2017-01-23 DIAGNOSIS — F17210 Nicotine dependence, cigarettes, uncomplicated: Secondary | ICD-10-CM

## 2017-01-23 DIAGNOSIS — R21 Rash and other nonspecific skin eruption: Secondary | ICD-10-CM

## 2017-01-23 NOTE — Progress Notes (Signed)
Patient states he has a dry cough.  Also has developed a rash on his left forearm and hand.  Otherwise, no complaints.

## 2017-01-23 NOTE — Progress Notes (Signed)
[place] Clinic day:  01/23/2017  Chief Complaint: Ricky Leonard is a 36 y.o. 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  HPI:   The patient was last seen in the hematology clinic on 10/24/2016.  At that time, he denied any new complaints. He remained on Eliquist.  He was still smoking about a half a pack a day.  During the interim, he notes "nothing new". He has a slight cough. He is smoking 3-4 cigarettes a day. He notes a rash on his arms and the back of his head for about a month. Rash is pruritic.  He has an appointment with Dr. Lucky Cowboy on 02/20/2017.   Past Medical History:  Diagnosis Date  . DVT (deep venous thrombosis) ([place])   . Hypertension     Past Surgical History:  Procedure Laterality Date  . NO PAST SURGERIES      Family History  Problem Relation Age of Onset  . Hypertension Other   . Diabetes Maternal Grandmother     Social History:  reports that he has been smoking Cigarettes.  He has been smoking about 0.50 packs per day. He has never used smokeless tobacco. He reports that he drinks alcohol. He reports that he does not use drugs.  He has smoked half a pack a day for the past 9 years.  He has used marijuana. He denies any alcohol use.  He is unemployed.  His wife's name is Candace.  He is alone today.  Allergies: No Known Allergies  Current Medications: Current Outpatient Prescriptions  Medication Sig Dispense Refill  . apixaban (ELIQUIS) 5 MG TABS tablet Take 1 tablet (5 mg total) by mouth 2 (two) times daily. 60 tablet 11  . carvedilol (COREG) 12.5 MG tablet Take 1 tablet (12.5 mg total) by mouth 2 (two) times daily with a meal. 60 tablet 2  . hydrochlorothiazide (HYDRODIURIL) 25 MG tablet Take 1 tablet (25 mg total) by mouth daily. 30 tablet 2  . lisinopril (PRINIVIL,ZESTRIL) 10 MG tablet Take 1 tablet (10 mg total) by mouth  daily. 30 tablet 2   No current facility-administered medications for this visit.     Review of Systems:  GENERAL:  Feels "fine". No fevers or sweats.  Weight down 2 pounds. PERFORMANCE STATUS (ECOG):  1 HEENT:  Blurry vision.  No runny nose, sore throat, mouth sores or tenderness. Lungs:  No shortness of breath.  Cough.  No hemoptysis.  Smoking 3-4 cigarettes a day. Cardiac:  No chest pain, palpitations, orthopnea, or PND.  Hypertension, did not take medications today. GI:  No nausea, vomiting, diarrhea, constipation, melena or hematochezia. GU:  No urgency, frequency, dysuria, or hematuria. Musculoskeletal:  No back or joint pain. No muscle tenderness. Extremities:  Left upper extremity edema, unchanged.  No pain. Skin:   Pruritic rash on arms and back of head for 1 month. Neuro:  No headache, numbness or weakness, balance or coordination issues. Endocrine:  No diabetes, thyroid issues, hot flashes or night sweats. Psych:  No mood changes, depression or anxiety. Pain:  No focal pain. Review of systems:  All other systems reviewed and found to be negative.  Physical Exam: Blood pressure 134/89, pulse 65, temperature (!) 96.4 F (35.8 C), temperature source Tympanic, resp. rate 18, weight 244 lb 2 oz (110.7 kg). GENERAL:  Well developed, well nourished, gentleman sitting comfortably in the exam  room in no acute distress. MENTAL STATUS:  Alert and oriented to person, place and time. HEAD:  Wearing a black cap.  Shaved head with a beard.  Normocephalic, atraumatic, face symmetric, no Cushingoid features. EYES:  Brown eyes.  Pupils equal round and reactive to light and accomodation.  No conjunctivitis or scleral icterus. ENT:  Oropharynx clear without lesion.  Tongue normal. Mucous membranes moist.  RESPIRATORY:  Clear to auscultation without rales, wheezes or rhonchi. CARDIOVASCULAR:  Regular rate and rhythm without murmur, rub or gallop. ABDOMEN:  Soft, non-tender, with active bowel  sounds, and no appreciable hepatosplenomegaly.  No masses. SKIN:  Subtle rash. EXTREMITIES: Mild left upper extremity edema.  No lower extremity edema, no skin discoloration or tenderness.  No palpable cords. LYMPH NODES: No palpable cervical, supraclavicular, axillary or inguinal adenopathy  NEUROLOGICAL: Unremarkable. PSYCH:  Appropriate.   No visits with results within 3 Day(s) from this visit.  Latest known visit with results is:  Clinical Support on 10/24/2016  Component Date Value Ref Range Status  . Protein S Activity 10/24/2016 64  63 - 140 % Final   Comment: (NOTE) Protein S activity may be falsely increased (masking an abnormal, low result) in patients receiving direct Xa inhibitor (e.g., rivaroxaban, apixaban, edoxaban) or a direct thrombin inhibitor (e.g., dabigatran) anticoagulant treatment due to assay interference by these drugs. Performed At: [facility] [place], [place] [id number] Lindon Romp MD PO:[id number]   . Sodium 10/24/2016 137  135 - 145 mmol/L Final  . Potassium 10/24/2016 3.6  3.5 - 5.1 mmol/L Final  . Chloride 10/24/2016 103  101 - 111 mmol/L Final  . CO2 10/24/2016 28  22 - 32 mmol/L Final  . Glucose, Bld 10/24/2016 132* 65 - 99 mg/dL Final  . BUN 10/24/2016 19  6 - 20 mg/dL Final  . Creatinine, Ser 10/24/2016 1.44* 0.61 - 1.24 mg/dL Final  . Calcium 10/24/2016 9.6  8.9 - 10.3 mg/dL Final  . Total Protein 10/24/2016 7.2  6.5 - 8.1 g/dL Final  . Albumin 10/24/2016 4.2  3.5 - 5.0 g/dL Final  . AST 10/24/2016 35  15 - 41 U/L Final  . ALT 10/24/2016 20  17 - 63 U/L Final  . Alkaline Phosphatase 10/24/2016 53  38 - 126 U/L Final  . Total Bilirubin 10/24/2016 0.8  0.3 - 1.2 mg/dL Final  . GFR calc non Af Amer 10/24/2016 >60  >60 mL/min Final  . GFR calc Af Amer 10/24/2016 >60  >60 mL/min Final   Comment: (NOTE) The eGFR has been calculated using the CKD EPI equation. This calculation has not been validated in all clinical  situations. eGFR's persistently <60 mL/min signify possible Chronic Kidney Disease.   . Anion gap 10/24/2016 6  5 - 15 Final  . AntiThromb III Func 10/24/2016 101  75 - 120 % Final   Performed at [facility] [address] [place], [place] [address]    Assessment:  MARTELL MCFADYEN is a 36 y.o. 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  Clot occurred after moving/lifting totes.  He is on Eliquis.  He has no history of prior thrombosis.  He denies any left upper extremity IV access or drug use.  He smokes.  He has no symptoms worrisome for malignancy.  CBC was normal.  He has no family history of thrombosis.  Work-up on 06/15/2016 revealed heterozygosity for the prothrombin gene mutation (  single G-20210-A mutation in the factor II gene).  Negative studies included: factor V Leiden, lupus anticoagulant panel, anticardiolipin antibodies, beta-2 glycoprotein antibodies.  Labs on 08/01/2016 revealed a normal CBC, CMP, Protein S activity, Protein C activity/antigen, and ATIII antigen.  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  There was no superficial thrombophlebitis.  Symptomatically, he notes a pruritic rash on the back of his head and arms.  He has a cough.  He continues to smoke, but has cut down to 3-4 cigarettes a day.  Plan:   1.  Labs today:  CMP, AT III activity, protein S antigen. 2.  Continue Eliquis. 3.  Encourage smoking cessation. 4.  Follow-up on 02/20/2017 in the vascular surgery clinic. 5.  RTC in 1 month for MD assessment and labs (CBC with differential, CMP, protein S antigen).  Addendum:  CXR today revealed no active cardiopulmonary disease.   Lequita Asal, MD 01/23/2017, 4:02 PM

## 2017-01-30 ENCOUNTER — Inpatient Hospital Stay: Payer: Medicaid Other

## 2017-01-30 ENCOUNTER — Inpatient Hospital Stay: Payer: Medicaid Other | Admitting: Hematology and Oncology

## 2017-01-30 NOTE — Progress Notes (Deleted)
[facility] day:  01/30/2017  Chief Complaint: Ricky Leonard is a 36 y.o. 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  HPI:   The patient was last seen in the hematology clinic on 10/24/2016.  At that time,   he had occasional blurry vision and left sided rib pain.  Hypertension was poorly controlled secondary to medication non-compliance.Smoking cessation was encouraged.  He was sent to the ER for his blood pressure.  Upper extremity venous duplex on 09/19/2016 by Dr. 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  There was no superficial thrombophlebitis.  Symptomatically, he denies any new complaints. He remains on Eliquis. He is still smoking one half pack a day.   Past Medical History:  Diagnosis Date  . DVT (deep venous thrombosis) ([facility])   . Hypertension     Past Surgical History:  Procedure Laterality Date  . NO PAST SURGERIES      Family History  Problem Relation Age of Onset  . Hypertension Other   . Diabetes Maternal Grandmother     Social History:  reports that he has been smoking Cigarettes.  He has been smoking about 0.50 packs per day. He has never used smokeless tobacco. He reports that he drinks alcohol. He reports that he does not use drugs.  He has smoked half a pack a day for the past 9 years.  He has used marijuana. He denies any alcohol use.  He is unemployed.  His wife's name is Candace.  He is alone today.  Allergies: No Known Allergies  Current Medications: Current Outpatient Prescriptions  Medication Sig Dispense Refill  . apixaban (ELIQUIS) 5 MG TABS tablet Take 1 tablet (5 mg total) by mouth 2 (two) times daily. 60 tablet 11  . carvedilol (COREG) 12.5 MG tablet Take 1 tablet (12.5 mg total) by mouth 2 (two) times daily with a meal. 60 tablet 2  .  hydrochlorothiazide (HYDRODIURIL) 25 MG tablet Take 1 tablet (25 mg total) by mouth daily. 30 tablet 2  . lisinopril (PRINIVIL,ZESTRIL) 10 MG tablet Take 1 tablet (10 mg total) by mouth daily. 30 tablet 2   No current facility-administered medications for this visit.     Review of Systems:  GENERAL:  Feels "fine". No fevers or sweats.  Weight up 3 pounds. PERFORMANCE STATUS (ECOG):  1 HEENT:  Blurry vision.  No runny nose, sore throat, mouth sores or tenderness. Lungs:  No shortness of breath or cough.  No hemoptysis. Cardiac:  No chest pain, palpitations, orthopnea, or PND.  Hypertension, did not take medications today. GI:  No nausea, vomiting, diarrhea, constipation, melena or hematochezia. GU:  No urgency, frequency, dysuria, or hematuria. Musculoskeletal:  No back or joint pain. No muscle tenderness. Extremities:  Left upper extremity edema, unchanged.  No pain. Skin:  No rashes or skin changes. Neuro:  No headache, numbness or weakness, balance or coordination issues. Endocrine:  No diabetes, thyroid issues, hot flashes or night sweats. Psych:  No mood changes, depression or anxiety. Pain:  No focal pain. Review of systems:  All other systems reviewed and found to be negative.  Physical Exam: There were no vitals taken for this visit. GENERAL:  Well developed, well nourished, gentleman sitting comfortably in the exam room in no acute distress. MENTAL STATUS:  Alert and oriented to person, place and  time. HEAD:  Shaved head with a beard.  Normocephalic, atraumatic, face symmetric, no Cushingoid features. EYES:  Brown eyes.  Pupils equal round and reactive to light and accomodation.  No conjunctivitis or scleral icterus. ENT:  Oropharynx clear without lesion.  Tongue normal. Mucous membranes moist.  RESPIRATORY:  Clear to auscultation without rales, wheezes or rhonchi. CARDIOVASCULAR:  Regular rate and rhythm without murmur, rub or gallop. ABDOMEN:  Soft, non-tender, with active  bowel sounds, and no appreciable hepatosplenomegaly.  No masses. SKIN:  No rashes, ulcers or lesions. EXTREMITIES: Mild left upper extremity edema.  No lower extremity edema, no skin discoloration or tenderness.  No palpable cords. LYMPH NODES: No palpable cervical, supraclavicular, axillary or inguinal adenopathy  NEUROLOGICAL: Unremarkable. PSYCH:  Appropriate.   No visits with results within 3 Day(s) from this visit.  Latest known visit with results is:  Clinical Support on 10/24/2016  Component Date Value Ref Range Status  . Protein S Activity 10/24/2016 64  63 - 140 % Final   Comment: (NOTE) Protein S activity may be falsely increased (masking an abnormal, low result) in patients receiving direct Xa inhibitor (e.g., rivaroxaban, apixaban, edoxaban) or a direct thrombin inhibitor (e.g., dabigatran) anticoagulant treatment due to assay interference by these drugs. Performed At: [facility] [place], [place] [id number] Lindon Romp MD HL:[id number]   . Sodium 10/24/2016 137  135 - 145 mmol/L Final  . Potassium 10/24/2016 3.6  3.5 - 5.1 mmol/L Final  . Chloride 10/24/2016 103  101 - 111 mmol/L Final  . CO2 10/24/2016 28  22 - 32 mmol/L Final  . Glucose, Bld 10/24/2016 132* 65 - 99 mg/dL Final  . BUN 10/24/2016 19  6 - 20 mg/dL Final  . Creatinine, Ser 10/24/2016 1.44* 0.61 - 1.24 mg/dL Final  . Calcium 10/24/2016 9.6  8.9 - 10.3 mg/dL Final  . Total Protein 10/24/2016 7.2  6.5 - 8.1 g/dL Final  . Albumin 10/24/2016 4.2  3.5 - 5.0 g/dL Final  . AST 10/24/2016 35  15 - 41 U/L Final  . ALT 10/24/2016 20  17 - 63 U/L Final  . Alkaline Phosphatase 10/24/2016 53  38 - 126 U/L Final  . Total Bilirubin 10/24/2016 0.8  0.3 - 1.2 mg/dL Final  . GFR calc non Af Amer 10/24/2016 >60  >60 mL/min Final  . GFR calc Af Amer 10/24/2016 >60  >60 mL/min Final   Comment: (NOTE) The eGFR has been calculated using the CKD EPI equation. This calculation has not been  validated in all clinical situations. eGFR's persistently <60 mL/min signify possible Chronic Kidney Disease.   . Anion gap 10/24/2016 6  5 - 15 Final  . AntiThromb III Func 10/24/2016 101  75 - 120 % Final   Performed at [facility] [address], [place], [place] [address]    Assessment:  Ricky Leonard is a 36 y.o. 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  Clot occurred after moving/lifting totes.  He is on Eliquis.  He has no history of prior thrombosis.  He denies any left upper extremity IV access or drug use.  He smokes.  He has no symptoms worrisome for malignancy.  CBC was normal.  He has no family history of thrombosis.  Work-up on 06/15/2016 revealed heterozygosity for the prothrombin gene mutation (single [id number]-A mutation in the factor II gene).  The following studies were negative: factor V Leiden,  lupus anticoagulant panel, anticardiolipin antibodies, beta-2 glycoprotein antibodies.  Labs on 08/01/2016 revealed a normal CBC, CMP, Protein S activity, Protein C activity/antigen, and ATIII antigen.  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  There was no superficial thrombophlebitis.  Symptomatically, he denies any new complaints.  Exam is stable.  He continues to smoke.  Plan:   1.  Labs today:  CMP, AT III activity, protein S antigen. 2.  Continue Eliquis. 3.  Encourage smoking cessation. 4.  Discuss blood pressure. 5.  Follow-up as scheduled in the vascular surgery clinic. 6.  RTC in 3 months for MD assessment.   Lequita Asal, MD 01/30/2017, 12:42 PM

## 2017-02-20 ENCOUNTER — Ambulatory Visit (INDEPENDENT_AMBULATORY_CARE_PROVIDER_SITE_OTHER): Payer: Medicaid Other | Admitting: Vascular Surgery

## 2017-02-20 ENCOUNTER — Encounter (INDEPENDENT_AMBULATORY_CARE_PROVIDER_SITE_OTHER): Payer: Self-pay | Admitting: Vascular Surgery

## 2017-02-20 VITALS — BP 177/111 | HR 61 | Resp 17 | Wt 248.0 lb

## 2017-02-20 DIAGNOSIS — F17200 Nicotine dependence, unspecified, uncomplicated: Secondary | ICD-10-CM | POA: Diagnosis not present

## 2017-02-20 DIAGNOSIS — M7989 Other specified soft tissue disorders: Secondary | ICD-10-CM | POA: Diagnosis not present

## 2017-02-20 DIAGNOSIS — I82890 Acute embolism and thrombosis of other specified veins: Secondary | ICD-10-CM | POA: Diagnosis not present

## 2017-02-20 DIAGNOSIS — I10 Essential (primary) hypertension: Secondary | ICD-10-CM | POA: Diagnosis not present

## 2017-02-20 NOTE — Assessment & Plan Note (Signed)
The patient has completed appropriate therapy for his jugular vein DVT. He sees hematology so if they would like to continue anticoagulation that is certainly reasonable, but from my standpoint he can stop full anticoagulation and switch over to aspirin therapy daily. I will see him back as needed.

## 2017-02-20 NOTE — Progress Notes (Signed)
MRN : [id number]  Ricky Leonard is a 36 y.o. ([birth year]) male who presents with chief complaint of  Chief Complaint  Patient presents with  . Follow-up  .  History of Present Illness: Patient returns today in follow up of left jugular DVT. He is doing well and has no current symptoms. He has no neck pain or swelling. He has no facial edema. He has not had any other clotting issues. He has tolerated anticoagulation now for over 6 months.  Current Outpatient Prescriptions  Medication Sig Dispense Refill  . apixaban (ELIQUIS) 5 MG TABS tablet Take 1 tablet (5 mg total) by mouth 2 (two) times daily. 60 tablet 11  . carvedilol (COREG) 12.5 MG tablet Take 1 tablet (12.5 mg total) by mouth 2 (two) times daily with a meal. 60 tablet 2  . hydrochlorothiazide (HYDRODIURIL) 25 MG tablet Take 1 tablet (25 mg total) by mouth daily. 30 tablet 2  . lisinopril (PRINIVIL,ZESTRIL) 10 MG tablet Take 1 tablet (10 mg total) by mouth daily. 30 tablet 2  . omeprazole (PRILOSEC) 20 MG capsule Take 20 mg by mouth daily.     No current facility-administered medications for this visit.     Past Medical History:  Diagnosis Date  . DVT (deep venous thrombosis) ([facility])   . Hypertension     Past Surgical History:  Procedure Laterality Date  . NO PAST SURGERIES     Social History       Social History  Substance Use Topics  . Smoking status: Current Every Day Smoker    Packs/day: 0.50    Types: Cigarettes  . Smokeless tobacco: Never Used  . Alcohol use Yes      Family History      Family History  Problem Relation Age of Onset  . Hypertension Other   . Diabetes Maternal Grandmother      No Known Allergies   REVIEW OF SYSTEMS (Negative unless checked)  Constitutional: [] Weight loss  [] Fever  [] Chills Cardiac: [] Chest pain   [] Chest pressure   [] Palpitations   [] Shortness of breath when laying flat   [] Shortness of breath at rest   [] Shortness of breath with  exertion. Vascular:  [] Pain in legs with walking   [] Pain in legs at rest   [] Pain in legs when laying flat   [] Claudication   [] Pain in feet when walking  [] Pain in feet at rest  [] Pain in feet when laying flat   [x] History of DVT   [] Phlebitis   [] Swelling in legs   [] Varicose veins   [] Non-healing ulcers Pulmonary:   [] Uses home oxygen   [] Productive cough   [] Hemoptysis   [] Wheeze  [] COPD   [] Asthma Neurologic:  [] Dizziness  [] Blackouts   [] Seizures   [] History of stroke   [] History of TIA  [] Aphasia   [] Temporary blindness   [] Dysphagia   [] Weakness or numbness in arms   [] Weakness or numbness in legs Musculoskeletal:  [] Arthritis   [] Joint swelling   [] Joint pain   [] Low back pain Hematologic:  [] Easy bruising  [] Easy bleeding   [] Hypercoagulable state   [] Anemic   Gastrointestinal:  [] Blood in stool   [] Vomiting blood  [] Gastroesophageal reflux/heartburn   [] Abdominal pain Genitourinary:  [] Chronic kidney disease   [] Difficult urination  [] Frequent urination  [] Burning with urination   [] Hematuria Skin:  [] Rashes   [] Ulcers   [] Wounds Psychological:  [] History of anxiety   []  History of major depression.   Physical Examination  BP (!) 177/111  Pulse 61   Resp 17   Wt 248 lb (112.5 kg)   BMI 35.58 kg/m  Gen:  WD/WN, NAD Head: [place]/AT, No temporalis wasting. Ear/Nose/Throat: Hearing grossly intact, nares w/o erythema or drainage, trachea midline Eyes: Conjunctiva clear. Sclera non-icteric Neck: No swelling or tenderness.  No JVD.  Pulmonary:  Good air movement, no use of accessory muscles.  Cardiac: RRR, normal S1, S2 Vascular:  Vessel Right Left  Radial Palpable Palpable                                    Musculoskeletal: M/S 5/5 throughout.  No deformity or atrophy. no edema. Neurologic: Sensation grossly intact in extremities.  Symmetrical.  Speech is fluent.  Psychiatric: Judgment intact, Mood & affect appropriate for pt's clinical situation. Dermatologic: No rashes  or ulcers noted.  No cellulitis or open wounds.       Labs No results found for this or any previous visit (from the past 2160 hour(s)).  Radiology Dg Chest 2 View  Result Date: 01/23/2017 CLINICAL DATA:  Cough for 2 months EXAM: CHEST  2 VIEW COMPARISON:  06/13/2016 chest radiograph. FINDINGS: Stable cardiomediastinal silhouette with normal heart size. No pneumothorax. No pleural effusion. Lungs appear clear, with no acute consolidative airspace disease and no pulmonary edema. IMPRESSION: No active cardiopulmonary disease. Electronically Signed   By: Delbert PhenixJason A Poff M.D.   On: 01/23/2017 17:01      Assessment/Plan Accelerated hypertension blood pressure control important in reducing the progression of atherosclerotic disease. On appropriate oral medications.   Tobacco use disorder We had a discussion for approximately 3 minutes regarding the absolute need for smoking cessation due to the deleterious nature of tobacco on the vascular system. Continued smoking will likely increase his risk of future thrombotic issues. We discussed the tobacco use would diminish patency of any intervention, and likely significantly worsen progression of disease. We discussed multiple agents for quitting including replacement therapy or medications to reduce cravings such as Chantix. The patient voices their understanding of the importance of smoking cessation.  Left arm swelling Resolved  Jugular vein thrombosis, left The patient has completed appropriate therapy for his jugular vein DVT. He sees hematology so if they would like to continue anticoagulation that is certainly reasonable, but from my standpoint he can stop full anticoagulation and switch over to aspirin therapy daily. I will see him back as needed.    Festus BarrenJason Dew, MD  02/20/2017 3:27 PM    This note was created with Dragon medical transcription system.  Any errors from dictation are purely unintentional

## 2017-02-20 NOTE — Assessment & Plan Note (Signed)
Resolved

## 2017-02-27 ENCOUNTER — Other Ambulatory Visit: Payer: Self-pay | Admitting: *Deleted

## 2017-02-27 ENCOUNTER — Inpatient Hospital Stay (HOSPITAL_BASED_OUTPATIENT_CLINIC_OR_DEPARTMENT_OTHER): Payer: Medicaid Other | Admitting: Hematology and Oncology

## 2017-02-27 ENCOUNTER — Encounter: Payer: Self-pay | Admitting: Hematology and Oncology

## 2017-02-27 ENCOUNTER — Inpatient Hospital Stay: Payer: Medicaid Other | Attending: Hematology and Oncology

## 2017-02-27 VITALS — BP 163/91 | HR 81 | Temp 98.4°F | Resp 16 | Ht 70.0 in | Wt 247.6 lb

## 2017-02-27 DIAGNOSIS — F17210 Nicotine dependence, cigarettes, uncomplicated: Secondary | ICD-10-CM | POA: Insufficient documentation

## 2017-02-27 DIAGNOSIS — I82622 Acute embolism and thrombosis of deep veins of left upper extremity: Secondary | ICD-10-CM | POA: Insufficient documentation

## 2017-02-27 DIAGNOSIS — I10 Essential (primary) hypertension: Secondary | ICD-10-CM

## 2017-02-27 DIAGNOSIS — I82B12 Acute embolism and thrombosis of left subclavian vein: Secondary | ICD-10-CM

## 2017-02-27 DIAGNOSIS — Z7901 Long term (current) use of anticoagulants: Secondary | ICD-10-CM | POA: Diagnosis not present

## 2017-02-27 DIAGNOSIS — Z79899 Other long term (current) drug therapy: Secondary | ICD-10-CM

## 2017-02-27 DIAGNOSIS — I82890 Acute embolism and thrombosis of other specified veins: Secondary | ICD-10-CM

## 2017-02-27 DIAGNOSIS — D6852 Prothrombin gene mutation: Secondary | ICD-10-CM | POA: Diagnosis not present

## 2017-02-27 DIAGNOSIS — R059 Cough, unspecified: Secondary | ICD-10-CM

## 2017-02-27 DIAGNOSIS — R05 Cough: Secondary | ICD-10-CM

## 2017-02-27 LAB — COMPREHENSIVE METABOLIC PANEL
ALT: 15 U/L — ABNORMAL LOW (ref 17–63)
AST: 22 U/L (ref 15–41)
Albumin: 4.1 g/dL (ref 3.5–5.0)
Alkaline Phosphatase: 56 U/L (ref 38–126)
Anion gap: 10 (ref 5–15)
BUN: 21 mg/dL — ABNORMAL HIGH (ref 6–20)
CO2: 28 mmol/L (ref 22–32)
Calcium: 9.9 mg/dL (ref 8.9–10.3)
Chloride: 100 mmol/L — ABNORMAL LOW (ref 101–111)
Creatinine, Ser: 1.42 mg/dL — ABNORMAL HIGH (ref 0.61–1.24)
GFR calc Af Amer: >60 mL/min (ref >60)
GFR calc non Af Amer: >60 mL/min (ref >60)
Glucose, Bld: 106 mg/dL — ABNORMAL HIGH (ref 65–99)
Potassium: 3.7 mmol/L (ref 3.5–5.1)
Sodium: 138 mmol/L (ref 135–145)
Total Bilirubin: 1.2 mg/dL (ref 0.3–1.2)
Total Protein: 7 g/dL (ref 6.5–8.1)

## 2017-02-27 LAB — CBC WITH DIFFERENTIAL/PLATELET
Basophils Absolute: 0.1 10*3/uL (ref 0–0.1)
Basophils Relative: 1 %
Eosinophils Absolute: 0.4 10*3/uL (ref 0–0.7)
Eosinophils Relative: 6 %
HCT: 44.4 % (ref 40.0–52.0)
Hemoglobin: 15.5 g/dL (ref 13.0–18.0)
Lymphocytes Relative: 32 %
Lymphs Abs: 1.8 10*3/uL (ref 1.0–3.6)
MCH: 32.6 pg (ref 26.0–34.0)
MCHC: 35 g/dL (ref 32.0–36.0)
MCV: 93.2 fL (ref 80.0–100.0)
Monocytes Absolute: 0.4 10*3/uL (ref 0.2–1.0)
Monocytes Relative: 7 %
Neutro Abs: 3.1 10*3/uL (ref 1.4–6.5)
Neutrophils Relative %: 54 %
Platelets: 234 10*3/uL (ref 150–440)
RBC: 4.76 MIL/uL (ref 4.40–5.90)
RDW: 13.3 % (ref 11.5–14.5)
WBC: 5.7 10*3/uL (ref 3.8–10.6)

## 2017-02-27 NOTE — Progress Notes (Signed)
[facility] day:  02/27/2017  Chief Complaint: ADALBERT ALBERTO is a 36 y.o. male with a left internal jugular vein and acute nonocclusive thrombus within the left subclavian vein who is seen for 1 month assessment.  HPI:   The patient was last seen in the hematology clinic on 01/23/2017.  At that time, he denied any complaints on Eliquis. He was still smoking 1/2 pack per day.  He was seen by Dr. Leotis Pain on 02/20/2017.  He denied any symptoms.  He was felt to have completed appropriate therapy for his jugular vein DVT. If discontinued, he was to switch over to aspirin a day. Patient reports that following visit with Dr. Lucky Cowboy, his Eliquis was discontinued on 02/21/2017.   Symptomatically, patient reports that he feels fine today. There are no physical complaints of anything.    Past Medical History:  Diagnosis Date  . DVT (deep venous thrombosis) ([place])   . Hypertension     Past Surgical History:  Procedure Laterality Date  . NO PAST SURGERIES      Family History  Problem Relation Age of Onset  . Hypertension Other   . Diabetes Maternal Grandmother     Social History:  reports that he has been smoking Cigarettes.  He has been smoking about 0.50 packs per day. He has never used smokeless tobacco. He reports that he drinks alcohol. He reports that he does not use drugs.  He has smoked half a pack a day for the past 9 years.  He has used marijuana. He denies any alcohol use.  He is unemployed.  His wife's name is Candace.  He is alone today.  Allergies: No Known Allergies  Current Medications: Current Outpatient Prescriptions  Medication Sig Dispense Refill  . carvedilol (COREG) 12.5 MG tablet Take 1 tablet (12.5 mg total) by mouth 2 (two) times daily with a meal. 60 tablet 2  . hydrochlorothiazide (HYDRODIURIL) 25 MG tablet Take 1 tablet (25 mg total) by mouth daily. 30 tablet 2  . lisinopril (PRINIVIL,ZESTRIL) 10 MG tablet Take 1 tablet  (10 mg total) by mouth daily. 30 tablet 2  . omeprazole (PRILOSEC) 20 MG capsule Take 20 mg by mouth daily.    Marland Kitchen apixaban (ELIQUIS) 5 MG TABS tablet Take 1 tablet (5 mg total) by mouth 2 (two) times daily. (Patient not taking: Reported on 02/27/2017) 60 tablet 11   No current facility-administered medications for this visit.     Review of Systems:  GENERAL:  Feels "fine". No fevers or sweats.  Weight  Stable.  PERFORMANCE STATUS (ECOG):  1 HEENT:  No runny nose, sore throat, mouth sores or tenderness. Lungs:  No shortness of breath or cough.  No hemoptysis. Cardiac:  No chest pain, palpitations, orthopnea, or PND.  Hypertension, did not take medications today. GI:  No nausea, vomiting, diarrhea, constipation, melena or hematochezia. GU:  No urgency, frequency, dysuria, or hematuria. Musculoskeletal:  No back or joint pain. No muscle tenderness. Extremities:  Left upper extremity edema, unchanged.  No pain. Skin:  No rashes or skin changes. Neuro:  No headache, numbness or weakness, balance or coordination issues. Endocrine:  No diabetes, thyroid issues, hot flashes or night sweats. Psych:  No mood changes, depression or anxiety. Pain:  No focal pain. Review of systems:  All other systems reviewed and found to be negative.  Physical Exam: Blood pressure (!) 163/91, pulse 81, temperature 98.4 F (36.9 C), temperature source Tympanic, resp. rate  16, height 5' 10"  (1.778 m), weight 247 lb 9.6 oz (112.3 kg). GENERAL:  Well developed, well nourished, gentleman sitting comfortably in the exam room in no acute distress. MENTAL STATUS:  Alert and oriented to person, place and time. HEAD:  Shaved head with a beard.  Normocephalic, atraumatic, face symmetric, no Cushingoid features. EYES:  Brown eyes.  Pupils equal round and reactive to light and accomodation.  No conjunctivitis or scleral icterus. ENT:  Oropharynx clear without lesion.  Tongue normal. Mucous membranes moist.  RESPIRATORY:  Clear  to auscultation without rales, wheezes or rhonchi. CARDIOVASCULAR:  Regular rate and rhythm without murmur, rub or gallop. ABDOMEN:  Soft, non-tender, with active bowel sounds, and no appreciable hepatosplenomegaly.  No masses. SKIN:  No rashes, ulcers or lesions. EXTREMITIES: Mild left upper extremity edema.  No lower extremity edema, no skin discoloration or tenderness.  No palpable cords. LYMPH NODES: No palpable cervical, supraclavicular, axillary or inguinal adenopathy  NEUROLOGICAL: Unremarkable. PSYCH:  Appropriate.   Appointment on 02/27/2017  Component Date Value Ref Range Status  . WBC 02/27/2017 5.7  3.8 - 10.6 K/uL Final  . RBC 02/27/2017 4.76  4.40 - 5.90 MIL/uL Final  . Hemoglobin 02/27/2017 15.5  13.0 - 18.0 g/dL Final  . HCT 02/27/2017 44.4  40.0 - 52.0 % Final  . MCV 02/27/2017 93.2  80.0 - 100.0 fL Final  . MCH 02/27/2017 32.6  26.0 - 34.0 pg Final  . MCHC 02/27/2017 35.0  32.0 - 36.0 g/dL Final  . RDW 02/27/2017 13.3  11.5 - 14.5 % Final  . Platelets 02/27/2017 234  150 - 440 K/uL Final  . Neutrophils Relative % 02/27/2017 54  % Final  . Neutro Abs 02/27/2017 3.1  1.4 - 6.5 K/uL Final  . Lymphocytes Relative 02/27/2017 32  % Final  . Lymphs Abs 02/27/2017 1.8  1.0 - 3.6 K/uL Final  . Monocytes Relative 02/27/2017 7  % Final  . Monocytes Absolute 02/27/2017 0.4  0.2 - 1.0 K/uL Final  . Eosinophils Relative 02/27/2017 6  % Final  . Eosinophils Absolute 02/27/2017 0.4  0 - 0.7 K/uL Final  . Basophils Relative 02/27/2017 1  % Final  . Basophils Absolute 02/27/2017 0.1  0 - 0.1 K/uL Final  . Sodium 02/27/2017 138  135 - 145 mmol/L Final  . Potassium 02/27/2017 3.7  3.5 - 5.1 mmol/L Final  . Chloride 02/27/2017 100* 101 - 111 mmol/L Final  . CO2 02/27/2017 28  22 - 32 mmol/L Final  . Glucose, Bld 02/27/2017 106* 65 - 99 mg/dL Final  . BUN 02/27/2017 21* 6 - 20 mg/dL Final  . Creatinine, Ser 02/27/2017 1.42* 0.61 - 1.24 mg/dL Final  . Calcium 02/27/2017 9.9  8.9 -  10.3 mg/dL Final  . Total Protein 02/27/2017 7.0  6.5 - 8.1 g/dL Final  . Albumin 02/27/2017 4.1  3.5 - 5.0 g/dL Final  . AST 02/27/2017 22  15 - 41 U/L Final  . ALT 02/27/2017 15* 17 - 63 U/L Final  . Alkaline Phosphatase 02/27/2017 56  38 - 126 U/L Final  . Total Bilirubin 02/27/2017 1.2  0.3 - 1.2 mg/dL Final  . GFR calc non Af Amer 02/27/2017 >60  >60 mL/min Final  . GFR calc Af Amer 02/27/2017 >60  >60 mL/min Final   Comment: (NOTE) The eGFR has been calculated using the CKD EPI equation. This calculation has not been validated in all clinical situations. eGFR's persistently <60 mL/min signify possible Chronic Kidney Disease.   Marland Kitchen  Anion gap 02/27/2017 10  5 - 15 Final    Assessment:  Sylar A Mcbreen is a 36 y.o. 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  Clot occurred after moving/lifting totes.  He is on Eliquis.  He has no history of prior thrombosis.  He denies any left upper extremity IV access or drug use.  He smokes.  He has no symptoms worrisome for malignancy.  CBC was normal.  He has no family history of thrombosis.  Work-up on 06/15/2016 revealed heterozygosity for the prothrombin gene mutation (single G-20210-A mutation in the factor II gene).  The following studies were negative: factor V Leiden, lupus anticoagulant panel, anticardiolipin antibodies, beta-2 glycoprotein antibodies.  Labs on 08/01/2016 revealed a normal CBC, CMP, protein S activity, protein C activity/antigen, and ATIII antigen.  ATIII activity and protein S antigen were normal on 10/24/2016 and 02/27/2017.  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  There was no superficial thrombophlebitis.  Symptomatically, he denies any new complaints.  Exam is stable.  He continues to smoke.  Plan:   1.  Labs today:  CBC with diff, CMP, protein S  antigen 2.  Discuss heterozygote status for prothrombin gene mutation.  Agree with discontinuation of anticoagulation if protein S normal.  It is unclear if the prothrombin mutation increases the risk of thrombosis recurrence; some studies suggest a possible increased risk while others do not.  In a 2009 review including 18 articles addressing thrombosis recurrence risk, heterozygosity for the mutation did not confer an increased risk of recurrent clot.  3.  Call patient with results from pending protein S antigen results. If results positive we will need to bring him back in for continued treatment, if not, we will not need to see him unless there is a recurrence.  Discussed life long anticoagulation if recurrent thrombosis. 4.  Encourage smoking cessation. 5.  RTC prn.   Addendum:  Protein S antigen was 79% total and 70% free (normal).  ATIII activity was 101% (normal) on 10/24/2016.   Lequita Asal, MD 02/27/2017, 3:00 PM

## 2017-02-27 NOTE — Progress Notes (Signed)
Patient here for follow up. He stopped Eliquis on 8/10.

## 2017-02-28 LAB — PROTEIN S, TOTAL AND FREE
Protein S Ag, Free: 70 % (ref 57–157)
Protein S Ag, Total: 79 % (ref 60–150)

## 2017-03-02 ENCOUNTER — Telehealth: Payer: Self-pay | Admitting: *Deleted

## 2017-03-02 NOTE — Telephone Encounter (Signed)
Called patient and informed him that his labs were wnl, voiced understanding.

## 2017-05-01 ENCOUNTER — Other Ambulatory Visit: Payer: Self-pay | Admitting: Family Medicine

## 2017-05-01 ENCOUNTER — Ambulatory Visit: Payer: Medicaid Other

## 2017-05-01 DIAGNOSIS — M79661 Pain in right lower leg: Secondary | ICD-10-CM

## 2018-01-09 ENCOUNTER — Other Ambulatory Visit: Payer: Self-pay

## 2018-01-09 ENCOUNTER — Encounter: Payer: Self-pay | Admitting: Emergency Medicine

## 2018-01-09 ENCOUNTER — Emergency Department: Payer: Medicaid Other

## 2018-01-09 ENCOUNTER — Emergency Department
Admission: EM | Admit: 2018-01-09 | Discharge: 2018-01-09 | Disposition: A | Discharge status: Home / Self Care | Payer: Medicaid Other | Attending: Emergency Medicine | Admitting: Emergency Medicine

## 2018-01-09 DIAGNOSIS — R600 Localized edema: Secondary | ICD-10-CM

## 2018-01-09 DIAGNOSIS — R609 Edema, unspecified: Secondary | ICD-10-CM | POA: Diagnosis not present

## 2018-01-09 DIAGNOSIS — Z79899 Other long term (current) drug therapy: Secondary | ICD-10-CM | POA: Insufficient documentation

## 2018-01-09 DIAGNOSIS — F17210 Nicotine dependence, cigarettes, uncomplicated: Secondary | ICD-10-CM | POA: Diagnosis not present

## 2018-01-09 DIAGNOSIS — I10 Essential (primary) hypertension: Secondary | ICD-10-CM | POA: Diagnosis not present

## 2018-01-09 DIAGNOSIS — R2243 Localized swelling, mass and lump, lower limb, bilateral: Secondary | ICD-10-CM | POA: Diagnosis present

## 2018-01-09 LAB — COMPREHENSIVE METABOLIC PANEL
ALBUMIN: 3.7 g/dL (ref 3.5–5.0)
ALT: 17 U/L (ref 0–44)
ANION GAP: 8 (ref 5–15)
AST: 19 U/L (ref 15–41)
Alkaline Phosphatase: 58 U/L (ref 38–126)
BUN: 15 mg/dL (ref 6–20)
CHLORIDE: 109 mmol/L (ref 98–111)
CO2: 22 mmol/L (ref 22–32)
Calcium: 9.1 mg/dL (ref 8.9–10.3)
Creatinine, Ser: 1.13 mg/dL (ref 0.61–1.24)
GFR calc Af Amer: >60 mL/min (ref >60)
GFR calc non Af Amer: >60 mL/min (ref >60)
GLUCOSE: 130 mg/dL — AB (ref 70–99)
POTASSIUM: 3.7 mmol/L (ref 3.5–5.1)
Sodium: 139 mmol/L (ref 135–145)
Total Bilirubin: 0.6 mg/dL (ref 0.3–1.2)
Total Protein: 6.5 g/dL (ref 6.5–8.1)

## 2018-01-09 LAB — CBC
HCT: 42.8 % (ref 40.0–52.0)
Hemoglobin: 14.7 g/dL (ref 13.0–18.0)
MCH: 32.1 pg (ref 26.0–34.0)
MCHC: 34.4 g/dL (ref 32.0–36.0)
MCV: 93.3 fL (ref 80.0–100.0)
PLATELETS: 229 10*3/uL (ref 150–440)
RBC: 4.58 MIL/uL (ref 4.40–5.90)
RDW: 13.1 % (ref 11.5–14.5)
WBC: 6.7 10*3/uL (ref 3.8–10.6)

## 2018-01-09 LAB — BRAIN NATRIURETIC PEPTIDE: B Natriuretic Peptide: 223 pg/mL — ABNORMAL HIGH (ref 0.0–100.0)

## 2018-01-09 LAB — TROPONIN I

## 2018-01-09 MED ORDER — FUROSEMIDE 40 MG PO TABS
40.0000 mg | ORAL_TABLET | Freq: Once | ORAL | Status: AC
  Administered 2018-01-09: 40 mg via ORAL
  Filled 2018-01-09: qty 1

## 2018-01-09 MED ORDER — HYDROCHLOROTHIAZIDE 25 MG PO TABS: 25.0000 mg | ORAL_TABLET | Freq: Every day | ORAL | 2 refills | Status: DC

## 2018-01-09 NOTE — ED Triage Notes (Signed)
Bilateral leg edema for unknown time. States he noticed it last night but does not know when it started. Denies chest pain or SOB. Denies leg pain.

## 2018-01-09 NOTE — Discharge Instructions (Addendum)
Do not fail to take your blood pressure medications including HCTZ.  Blood pressure was somewhat elevated here, this most likely because you are out of that medication but it would be in your best interest to have a close recheck with your primary care doctor in the next few days.  Given your lower extremity swelling that gets worse when you go off your medications you will need a cardiac echo in the next few days.  Please call cardiology stood above in the next day or 2 to see if he can set that up.  If you have any chest pain or shortness of breath or feel worse in any way please return to the emergency department.  Take your medications as prescribed.  Your BNP which is a marker of possible heart failure is slightly up which means that you do need to follow closely with a heart doctor

## 2018-01-09 NOTE — ED Provider Notes (Signed)
[facility] Emergency Department Provider Note  ____________________________________________   I have reviewed the triage vital signs and the nursing notes. Where available I have reviewed prior notes and, if possible and indicated, outside hospital notes.    HISTORY  Chief Complaint Leg Swelling    HPI Ricky Leonard is a 37 y.o. 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  Patient has no chest pain or shortness of breath.  Initially states he was taking all of his medications but not being specifically asked, recollected that he ran out of his HCTZ about 1 week ago and since that time he had mild lower extremity swelling.  Patient has no unilateral leg burning.  He denies any other associated symptoms.  He thinks his legs are swollen up like this before when he ran out of his HCTZ last time now that he remembers it.  He did take his other blood pressure medications, he states his blood pressure run somewhat high.  Patient has had no orthopnea no exertional symptoms, he had a negative echo with a 65% EF about a year and a half ago.  And today he had negative Dopplers performed.      Past Medical History:  Diagnosis Date  . DVT (deep venous thrombosis) (HCC)   . Hypertension     Patient Active Problem List   Diagnosis Date Noted  . 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 Prothrombin gene mutation (HCC) 07/11/2016  . Tobacco use disorder 07/01/2016  . 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 Accelerated hypertension 06/13/2016  . Chest pain 06/13/2016  . Left arm swelling 06/13/2016    Past Surgical History:  Procedure Laterality Date  . NO PAST SURGERIES      Prior to Admission medications   Medication Sig Start Date End Date Taking? Authorizing Provider  apixaban (ELIQUIS) 5 MG TABS tablet Take 1 tablet (5 mg  total) by mouth 2 (two) times daily. Patient not taking: Reported on 02/27/2017 07/25/16   Rosey Bath, MD  carvedilol (COREG) 12.5 MG tablet Take 1 tablet (12.5 mg total) by mouth 2 (two) times daily with a meal. 06/16/16   Shaune Pollack, MD  hydrochlorothiazide (HYDRODIURIL) 25 MG tablet Take 1 tablet (25 mg total) by mouth daily. 06/17/16   Shaune Pollack, MD  lisinopril (PRINIVIL,ZESTRIL) 10 MG tablet Take 1 tablet (10 mg total) by mouth daily. 06/17/16   Shaune Pollack, MD  omeprazole (PRILOSEC) 20 MG capsule Take 20 mg by mouth daily.    [provider]    Allergies Patient has no known allergies.  Family History  Problem Relation Age of Onset  . Hypertension Other   . Diabetes Maternal Grandmother     Social History Social History   Tobacco Use  . Smoking status: Current Every Day Smoker    Packs/day: 0.50    Types: Cigarettes  . Smokeless tobacco: Never Used  Substance Use Topics  . Alcohol use: Yes  . Drug use: No    Review of Systems Constitutional: No fever/chills Eyes: No visual changes. ENT: No sore throat. No stiff neck no neck pain Cardiovascular: Denies chest pain. Respiratory: Denies shortness of breath. Gastrointestinal:   no vomiting.  No diarrhea.  No constipation. Genitourinary: Negative for dysuria. Musculoskeletal: + lower extremity swelling Skin: Negative for rash. Neurological: Negative for severe headaches, focal weakness  or numbness.   ____________________________________________   PHYSICAL EXAM:  VITAL SIGNS: ED Triage Vitals [01/09/18 1321]  Enc Vitals Group     BP (!) 184/103     Pulse Rate 85     Resp 18     Temp 98.2 F (36.8 C)     Temp Source Oral     SpO2 95 %     Weight 292 lb (132.5 kg)     Height 5\' 10"  (1.778 m)     Head Circumference      Peak Flow      Pain Score 0     Pain Loc      Pain Edu?      Excl. in GC?     Constitutional: Alert and oriented. Well appearing and in no acute distress. Eyes:  Conjunctivae are normal Head: Atraumatic HEENT: No congestion/rhinnorhea. Mucous membranes are moist.  Oropharynx non-erythematous Neck:   Nontender with no meningismus, no masses, no stridor Cardiovascular: Normal rate, regular rhythm. Grossly normal heart sounds.  Good peripheral circulation. Respiratory: Normal respiratory effort.  No retractions. Lungs CTAB. Abdominal: Soft and nontender. No distention. No guarding no rebound Back:  There is no focal tenderness or step off.  there is no midline tenderness there are no lesions noted. there is no CVA tenderness  Musculoskeletal: No lower extremity tenderness, no upper extremity tenderness. No joint effusions, no DVT signs strong distal pulses bilateral symmetric 2+ pitting edema Neurologic:  Normal speech and language. No gross focal neurologic deficits are appreciated.  Skin:  Skin is warm, dry and intact. No rash noted. Psychiatric: Mood and affect are normal. Speech and behavior are normal.  ____________________________________________   LABS (all labs ordered are listed, but only abnormal results are displayed)  Labs Reviewed  COMPREHENSIVE METABOLIC PANEL - Abnormal; Notable for the following components:      Result Value   Glucose, Bld 130 (*)    All other components within normal limits  CBC  TROPONIN I  BRAIN NATRIURETIC PEPTIDE    Pertinent labs  results that were available during my care of the patient were reviewed by me and considered in my medical decision making (see chart for details). ____________________________________________  EKG  I personally interpreted any EKGs ordered by me or triage Normal sinus rhythm, sinus rhythm, rates 82 bpm, no acute ST elevation or depression nonspecific ST changes noted      RADIOLOGY  Pertinent labs & imaging results that were available during my care of the patient were reviewed by me and considered in my medical decision making (see chart for details). If possible,  patient and/or family made aware of any abnormal findings.  Dg Chest 2 View  Result Date: 01/09/2018 CLINICAL DATA:  Bilateral leg edema. EXAM: CHEST - 2 VIEW COMPARISON:  January 23, 2017 FINDINGS: The heart size and mediastinal contours are within normal limits. Both lungs are clear. The visualized skeletal structures are unremarkable. IMPRESSION: No active cardiopulmonary disease. Electronically Signed   By: Gerome Samavid  Williams III M.D   On: 01/09/2018 13:56   [place] Venous Img Lower Bilateral  Result Date: 01/09/2018 CLINICAL DATA:  Edema, bilateral EXAM: BILATERAL LOWER EXTREMITY VENOUS DOPPLER ULTRASOUND TECHNIQUE: Gray-scale sonography with compression, as well as color and duplex ultrasound, were performed to evaluate the deep venous system from the level of the common femoral vein through the popliteal and proximal calf veins. COMPARISON:  None FINDINGS: Normal compressibility of the common femoral, superficial femoral, and popliteal veins, as well as the  proximal calf veins. No filling defects to suggest DVT on grayscale or color Doppler imaging. Doppler waveforms show normal direction of venous flow, normal respiratory phasicity and response to augmentation. IMPRESSION: No evidence of  lower extremity deep vein thrombosis, bilaterally. Electronically Signed   By: Corlis Leak M.D.   On: 01/09/2018 14:33   ____________________________________________    PROCEDURES  Procedure(s) performed: None  Procedures  Critical Care performed: None  ____________________________________________   INITIAL IMPRESSION / ASSESSMENT AND PLAN / ED COURSE  Pertinent labs & imaging results that were available during my care of the patient were reviewed by me and considered in my medical decision making (see chart for details).  Patient in no acute distress, has mild bilateral lower extremity edema in the context of stopping his diuretic, no evidence of DVT on Doppler ultrasound.  Patient's most recent echo is  normal.  Obviously this is some time ago but I do not think it requires admission or inpatient stay is no complaints consistent with decompensated CHF.  We will start him back on his diuretic and I think that will help him.  Return precautions follow-up given and understood.  I do a BNP pending if that is positive in any significant way, he will need more close outpatient cardio to follow-up but in any event we will refer him there either way    ____________________________________________   FINAL CLINICAL IMPRESSION(S) / ED DIAGNOSES  Final diagnoses:  Bilateral leg edema      This chart was dictated using voice recognition software.  Despite best efforts to proofread,  errors can occur which can change meaning.      Jeanmarie Plant, MD 01/09/18 1538

## 2018-03-23 ENCOUNTER — Other Ambulatory Visit: Payer: Self-pay | Admitting: Family Medicine

## 2018-03-23 DIAGNOSIS — M25461 Effusion, right knee: Secondary | ICD-10-CM

## 2018-03-26 ENCOUNTER — Ambulatory Visit
Admission: RE | Admit: 2018-03-26 | Discharge: 2018-03-26 | Disposition: A | Discharge status: Home / Self Care | Payer: Medicaid Other | Source: Ambulatory Visit | Attending: Family Medicine | Admitting: Family Medicine

## 2018-03-26 DIAGNOSIS — M7989 Other specified soft tissue disorders: Secondary | ICD-10-CM | POA: Insufficient documentation

## 2018-03-26 DIAGNOSIS — M25461 Effusion, right knee: Secondary | ICD-10-CM

## 2018-05-10 ENCOUNTER — Emergency Department: Payer: Medicaid Other

## 2018-05-10 ENCOUNTER — Other Ambulatory Visit: Payer: Self-pay

## 2018-05-10 ENCOUNTER — Encounter: Payer: Self-pay | Admitting: Emergency Medicine

## 2018-05-10 ENCOUNTER — Emergency Department
Admission: EM | Admit: 2018-05-10 | Discharge: 2018-05-11 | Disposition: A | Discharge status: Home / Self Care | Payer: Medicaid Other | Attending: Emergency Medicine | Admitting: Emergency Medicine

## 2018-05-10 DIAGNOSIS — I10 Essential (primary) hypertension: Secondary | ICD-10-CM | POA: Insufficient documentation

## 2018-05-10 DIAGNOSIS — R05 Cough: Secondary | ICD-10-CM | POA: Diagnosis present

## 2018-05-10 DIAGNOSIS — J40 Bronchitis, not specified as acute or chronic: Secondary | ICD-10-CM | POA: Diagnosis not present

## 2018-05-10 DIAGNOSIS — Z86718 Personal history of other venous thrombosis and embolism: Secondary | ICD-10-CM | POA: Insufficient documentation

## 2018-05-10 DIAGNOSIS — F17210 Nicotine dependence, cigarettes, uncomplicated: Secondary | ICD-10-CM | POA: Insufficient documentation

## 2018-05-10 DIAGNOSIS — Z79899 Other long term (current) drug therapy: Secondary | ICD-10-CM | POA: Diagnosis not present

## 2018-05-10 NOTE — ED Triage Notes (Addendum)
Patient ambulatory to triage with steady gait, without difficulty or distress noted, mask in place; pt reports prod cough clear sputum since Monday wk ago; +smoker; denies any [place] Day

## 2018-05-11 MED ORDER — IPRATROPIUM-ALBUTEROL 0.5-2.5 (3) MG/3ML IN SOLN
3.0000 mL | Freq: Once | RESPIRATORY_TRACT | Status: AC
  Administered 2018-05-11: 3 mL via RESPIRATORY_TRACT
  Filled 2018-05-11: qty 3

## 2018-05-11 MED ORDER — ALBUTEROL SULFATE HFA 108 (90 BASE) MCG/ACT IN AERS: 2.0000 | INHALATION_SPRAY | Freq: Four times a day (QID) | RESPIRATORY_TRACT | 0 refills | Status: DC | PRN

## 2018-05-11 MED ORDER — BENZONATATE 100 MG PO CAPS: ORAL_CAPSULE | ORAL | 0 refills | Status: DC

## 2018-05-11 MED ORDER — DEXAMETHASONE SODIUM PHOSPHATE 10 MG/ML IJ SOLN
10.0000 mg | Freq: Once | INTRAMUSCULAR | Status: AC
  Administered 2018-05-11: 10 mg via INTRAMUSCULAR
  Filled 2018-05-11: qty 1

## 2018-05-11 MED ORDER — PREDNISONE 10 MG PO TABS: 10.0000 mg | ORAL_TABLET | Freq: Two times a day (BID) | ORAL | 0 refills | Status: DC

## 2018-05-11 MED ORDER — ACETAMINOPHEN-CODEINE #3 300-30 MG PO TABS: 1.0000 | ORAL_TABLET | Freq: Three times a day (TID) | ORAL | 0 refills | Status: AC | PRN

## 2018-05-11 NOTE — Discharge Instructions (Addendum)
Your x-ray shows bronchitis, from your smoking, but probably worsened by a viral infection. Take the steroid as directed and the cough tabs as directed. Take the Tylenol with codeine for cough relief. Use the inhaler for cough and wheezing every 4-6 hours. Consider picking up OTC Delsym for additional cough relief. Follow-up with your provider or return as needed.

## 2018-05-12 NOTE — ED Provider Notes (Signed)
[facility] Emergency Department Provider Note ____________________________________________  Time seen: 0009  I have reviewed the triage vital signs and the nursing notes.  HISTORY  Chief Complaint  Cough  HPI Ricky Leonard is a 37 y.o. male who presents himself to the ED for evaluation of cough, congestion, and mild shortness of breath.  He describes symptoms have been persistent since all last week.  He denies any interim fevers or chills.  He also denies any sick contacts, recent travel, or other exposures.   Past Medical History:  Diagnosis Date  . DVT (deep venous thrombosis) (HCC)   . Hypertension     Patient Active Problem List   Diagnosis Date Noted  . 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 Prothrombin gene mutation (HCC) 07/11/2016  . Tobacco use disorder 07/01/2016  . 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 Accelerated hypertension 06/13/2016  . Chest pain 06/13/2016  . Left arm swelling 06/13/2016    Past Surgical History:  Procedure Laterality Date  . NO PAST SURGERIES      Prior to Admission medications   Medication Sig Start Date End Date Taking? Authorizing Provider  acetaminophen-codeine (TYLENOL #3) 300-30 MG tablet Take 1 tablet by mouth every 8 (eight) hours as needed for up to 3 days for moderate pain. 05/11/18 05/14/18  Cheryle Dark, Charlesetta Ivory, PA-C  albuterol (PROVENTIL HFA;VENTOLIN HFA) 108 (90 Base) MCG/ACT inhaler Inhale 2 puffs into the lungs every 6 (six) hours as needed for wheezing or shortness of breath. 05/11/18   Ahnesty Finfrock, Charlesetta Ivory, PA-C  apixaban (ELIQUIS) 5 MG TABS tablet Take 1 tablet (5 mg total) by mouth 2 (two) times daily. Patient not taking: Reported on 02/27/2017 07/25/16   Rosey Bath, MD  benzonatate (TESSALON PERLES) 100 MG capsule Take 1-2 tabs TID prn cough 05/11/18   Motty Borin, Charlesetta Ivory, PA-C  carvedilol (COREG) 12.5 MG tablet Take 1 tablet (12.5 mg total) by mouth 2 (two)  times daily with a meal. 06/16/16   Shaune Pollack, MD  hydrochlorothiazide (HYDRODIURIL) 25 MG tablet Take 1 tablet (25 mg total) by mouth daily. 01/09/18   Jeanmarie Plant, MD  lisinopril (PRINIVIL,ZESTRIL) 10 MG tablet Take 1 tablet (10 mg total) by mouth daily. 06/17/16   Shaune Pollack, MD  omeprazole (PRILOSEC) 20 MG capsule Take 20 mg by mouth daily.    [provider]  predniSONE (DELTASONE) 10 MG tablet Take 1 tablet (10 mg total) by mouth 2 (two) times daily with a meal. 05/11/18   Zeta Bucy, Charlesetta Ivory, PA-C    Allergies Patient has no known allergies.  Family History  Problem Relation Age of Onset  . Hypertension Other   . Diabetes Maternal Grandmother     Social History Social History   Tobacco Use  . Smoking status: Current Every Day Smoker    Packs/day: 0.50    Types: Cigarettes  . Smokeless tobacco: Never Used  Substance Use Topics  . Alcohol use: Yes  . Drug use: No    Review of Systems  Constitutional: Negative for fever. Eyes: Negative for visual changes. ENT: Negative for sore throat. Cardiovascular: Negative for chest pain. Respiratory: Positive for shortness of breath and coughing Gastrointestinal: Negative for abdominal pain, vomiting and diarrhea. Genitourinary: Negative for dysuria. Musculoskeletal: Negative for back pain. Skin: Negative for rash. Neurological: Negative for headaches, focal weakness or numbness. ____________________________________________  PHYSICAL EXAM:  VITAL SIGNS: ED Triage Vitals  Enc Vitals Group  BP 05/10/18 2223 (!) 172/100     Pulse Rate 05/10/18 2223 100     Resp 05/10/18 2223 20     Temp 05/10/18 2223 99.6 F (37.6 C)     Temp Source 05/10/18 2223 Oral     SpO2 05/10/18 2223 97 %     Weight 05/10/18 2223 280 lb (127 kg)     Height 05/10/18 2223 5\' 10"  (1.778 m)     Head Circumference --      Peak Flow --      Pain Score 05/10/18 2225 0     Pain Loc --      Pain Edu? --      Excl. in GC? --      Constitutional: Alert and oriented. Well appearing and in no distress. Head: Normocephalic and atraumatic. Eyes: Conjunctivae are normal. PERRL. Normal extraocular movements Ears: Canals clear. TMs intact bilaterally. Nose: No congestion/rhinorrhea/epistaxis. Mouth/Throat: Mucous membranes are moist. Uvula is midline and tonsils are flat.  Cardiovascular: Normal rate, regular rhythm. Normal distal pulses. Respiratory: Normal respiratory effort. No wheezes/rales. Mild rhonchi bilaterally Musculoskeletal: Nontender with normal range of motion in all extremities.  Neurologic:  Normal gait without ataxia. Normal speech and language. No gross focal neurologic deficits are appreciated. Skin:  Skin is warm, dry and intact. No rash noted. ____________________________________________   RADIOLOGY  CXR  IMPRESSION: Likely smoking related bronchitic change of the lungs. ____________________________________________  PROCEDURES  Procedures DuoNeb x 1 Decadron 10 mg PO ____________________________________________  INITIAL IMPRESSION / ASSESSMENT AND PLAN / ED COURSE  Patient with ED evaluation of 1 week complaint of intermittently productive cough and congestion.  Patient's exam is consistent with a likely bronchitis as he is confirmed by his chest.  He is noting improvement after breathing treatment here in the ED.  He will be discharged with a prescription for Tessalon Perles, prednisone, albuterol inhaler, and Tylenol with codeine.  He is encouraged to follow-up with the local community clinic for ongoing symptom management.  Return to the ED if necessary. ____________________________________________  FINAL CLINICAL IMPRESSION(S) / ED DIAGNOSES  Final diagnoses:  Bronchitis      Lucresia Simic, Charlesetta Ivory, PA-C 05/12/18 0107    Emily Filbert, MD 05/15/18 1501

## 2018-07-18 ENCOUNTER — Emergency Department
Admission: EM | Admit: 2018-07-18 | Discharge: 2018-07-18 | Disposition: A | Discharge status: Home / Self Care | Payer: Medicaid Other | Attending: Emergency Medicine | Admitting: Emergency Medicine

## 2018-07-18 ENCOUNTER — Emergency Department: Payer: Medicaid Other

## 2018-07-18 ENCOUNTER — Other Ambulatory Visit: Payer: Self-pay

## 2018-07-18 DIAGNOSIS — R062 Wheezing: Secondary | ICD-10-CM | POA: Insufficient documentation

## 2018-07-18 DIAGNOSIS — I10 Essential (primary) hypertension: Secondary | ICD-10-CM | POA: Diagnosis not present

## 2018-07-18 DIAGNOSIS — J209 Acute bronchitis, unspecified: Secondary | ICD-10-CM | POA: Diagnosis not present

## 2018-07-18 DIAGNOSIS — Z79899 Other long term (current) drug therapy: Secondary | ICD-10-CM | POA: Diagnosis not present

## 2018-07-18 DIAGNOSIS — R609 Edema, unspecified: Secondary | ICD-10-CM | POA: Diagnosis not present

## 2018-07-18 DIAGNOSIS — F17210 Nicotine dependence, cigarettes, uncomplicated: Secondary | ICD-10-CM | POA: Insufficient documentation

## 2018-07-18 DIAGNOSIS — R0602 Shortness of breath: Secondary | ICD-10-CM | POA: Diagnosis present

## 2018-07-18 DIAGNOSIS — R600 Localized edema: Secondary | ICD-10-CM

## 2018-07-18 LAB — CBC
HEMATOCRIT: 42.3 % (ref 39.0–52.0)
HEMOGLOBIN: 14.1 g/dL (ref 13.0–17.0)
MCH: 31.1 pg (ref 26.0–34.0)
MCHC: 33.3 g/dL (ref 30.0–36.0)
MCV: 93.4 fL (ref 80.0–100.0)
Platelets: 232 10*3/uL (ref 150–400)
RBC: 4.53 MIL/uL (ref 4.22–5.81)
RDW: 11.9 % (ref 11.5–15.5)
WBC: 8.8 10*3/uL (ref 4.0–10.5)
nRBC: 0 % (ref 0.0–0.2)

## 2018-07-18 LAB — BASIC METABOLIC PANEL
Anion gap: 7 (ref 5–15)
BUN: 18 mg/dL (ref 6–20)
CHLORIDE: 107 mmol/L (ref 98–111)
CO2: 25 mmol/L (ref 22–32)
CREATININE: 1.09 mg/dL (ref 0.61–1.24)
Calcium: 9.6 mg/dL (ref 8.9–10.3)
GFR calc Af Amer: >60 mL/min (ref >60)
GFR calc non Af Amer: >60 mL/min (ref >60)
GLUCOSE: 130 mg/dL — AB (ref 70–99)
POTASSIUM: 3.6 mmol/L (ref 3.5–5.1)
SODIUM: 139 mmol/L (ref 135–145)

## 2018-07-18 LAB — FIBRIN DERIVATIVES D-DIMER (ARMC ONLY): FIBRIN DERIVATIVES D-DIMER (ARMC): 458.76 ng{FEU}/mL (ref 0.00–499.00)

## 2018-07-18 LAB — BRAIN NATRIURETIC PEPTIDE: B Natriuretic Peptide: 14 pg/mL (ref 0.0–100.0)

## 2018-07-18 LAB — TROPONIN I: Troponin I: <0.03 ng/mL (ref <0.03)

## 2018-07-18 MED ORDER — HYDROCHLOROTHIAZIDE 25 MG PO TABS: 25.0000 mg | ORAL_TABLET | Freq: Every day | ORAL | 0 refills | Status: DC

## 2018-07-18 MED ORDER — LOSARTAN POTASSIUM 50 MG PO TABS
50.0000 mg | ORAL_TABLET | ORAL | Status: AC
  Administered 2018-07-18: 50 mg via ORAL
  Filled 2018-07-18: qty 1

## 2018-07-18 MED ORDER — PREDNISONE 20 MG PO TABS: 40.0000 mg | ORAL_TABLET | Freq: Every day | ORAL | 0 refills | Status: DC

## 2018-07-18 MED ORDER — PREDNISONE 20 MG PO TABS
40.0000 mg | ORAL_TABLET | Freq: Once | ORAL | Status: AC
  Administered 2018-07-18: 40 mg via ORAL
  Filled 2018-07-18: qty 2

## 2018-07-18 MED ORDER — CARVEDILOL 12.5 MG PO TABS
12.5000 mg | ORAL_TABLET | ORAL | Status: AC
  Administered 2018-07-18: 12.5 mg via ORAL
  Filled 2018-07-18: qty 1

## 2018-07-18 MED ORDER — IPRATROPIUM-ALBUTEROL 0.5-2.5 (3) MG/3ML IN SOLN
3.0000 mL | Freq: Once | RESPIRATORY_TRACT | Status: AC
  Administered 2018-07-18: 3 mL via RESPIRATORY_TRACT
  Filled 2018-07-18: qty 3

## 2018-07-18 MED ORDER — ALBUTEROL SULFATE HFA 108 (90 BASE) MCG/ACT IN AERS: 2.0000 | INHALATION_SPRAY | Freq: Four times a day (QID) | RESPIRATORY_TRACT | 2 refills | Status: DC | PRN

## 2018-07-18 MED ORDER — HYDROCHLOROTHIAZIDE 25 MG PO TABS
25.0000 mg | ORAL_TABLET | Freq: Every day | ORAL | Status: DC
  Administered 2018-07-18: 25 mg via ORAL
  Filled 2018-07-18: qty 1

## 2018-07-18 MED ORDER — LOSARTAN POTASSIUM 25 MG PO TABS
25.0000 mg | ORAL_TABLET | ORAL | Status: AC
  Administered 2018-07-18: 25 mg via ORAL
  Filled 2018-07-18 (×2): qty 1

## 2018-07-18 NOTE — ED Triage Notes (Signed)
States SOB and fluid in legs. Denies CHF. Unsure if gained weight. No pitting edema. SOB x 2 days. States cough. Mask in place. No resp distress noted.

## 2018-07-18 NOTE — ED Notes (Signed)
Food tray and beverage provided to pt.

## 2018-07-18 NOTE — ED Provider Notes (Signed)
[facility] Emergency Department Provider Note   ____________________________________________   First MD Initiated Contact with Patient 07/18/18 1612     (approximate)  I have reviewed the triage vital signs and the nursing notes.   HISTORY  Chief Complaint Shortness of Breath    HPI Ricky Leonard is a 38 y.o. male here for evaluation of shortness of breath  and wheezing  Patient reports that for about 2 months now he had fluid in his lower legs, but has also previously had this in the past.  He also reports he is been feeling a little short of breath for the last couple of days.  He has had a dry cough, started with a bit of nasal congestion but no fever and a "cold" about 5 days ago.  Denies trouble breathing right now, but reports when he walks a distance he will feel like he is wheezing a little bit and become slightly short of breath  No chest pain.  No nausea vomiting.  No abdominal pain.  Denies muscle aches.  Reports he is compliant with use of losartan and hydrochlorothiazide.  Has an inhaler at home but thinks it is probably older not working and he has not used it for this.  Past Medical History:  Diagnosis Date  . DVT (deep venous thrombosis) (HCC)   . Hypertension     Patient Active Problem List   Diagnosis Date Noted  . 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 Prothrombin gene mutation (HCC) 07/11/2016  . Tobacco use disorder 07/01/2016  . 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 Accelerated hypertension 06/13/2016  . Chest pain 06/13/2016  . Left arm swelling 06/13/2016    Past Surgical History:  Procedure Laterality Date  . NO PAST SURGERIES      Prior to Admission medications   Medication Sig Start Date End Date Taking? Authorizing Provider  albuterol (PROVENTIL HFA;VENTOLIN HFA) 108 (90 Base) MCG/ACT inhaler Inhale 2 puffs into the lungs every 6 (six) hours as needed for wheezing or shortness of breath. 07/18/18    Sharyn Creamer, MD  benzonatate (TESSALON PERLES) 100 MG capsule Take 1-2 tabs TID prn cough 05/11/18   Menshew, Charlesetta Ivory, PA-C  carvedilol (COREG) 12.5 MG tablet Take 1 tablet (12.5 mg total) by mouth 2 (two) times daily with a meal. 06/16/16   Shaune Pollack, MD  hydrochlorothiazide (HYDRODIURIL) 25 MG tablet Take 1 tablet (25 mg total) by mouth daily. 07/18/18   Sharyn Creamer, MD  omeprazole (PRILOSEC) 20 MG capsule Take 20 mg by mouth daily.    [provider]  predniSONE (DELTASONE) 20 MG tablet Take 2 tablets (40 mg total) by mouth daily with breakfast. 07/18/18   Sharyn Creamer, MD    Allergies Patient has no known allergies.  Family History  Problem Relation Age of Onset  . Hypertension Other   . Diabetes Maternal Grandmother     Social History Social History   Tobacco Use  . Smoking status: Current Every Day Smoker    Packs/day: 0.50    Types: Cigarettes  . Smokeless tobacco: Never Used  Substance Use Topics  . Alcohol use: Yes  . Drug use: No    Review of Systems Constitutional: No fever/chills but is been feeling nasal congestion is only has a "cold" and a slight dry cough. Eyes: No visual changes. ENT: No sore throat. Cardiovascular: Denies chest pain. Respiratory: Denies shortness of breath with laying flat. Gastrointestinal: No abdominal pain.  Genitourinary: Negative for dysuria. Musculoskeletal: Negative for back pain.  Slight amount of swelling in both ankles in the lower legs for about 2 months Skin: Negative for rash. Neurological: Negative for headaches, areas of focal weakness or numbness.    ____________________________________________   PHYSICAL EXAM:  VITAL SIGNS: ED Triage Vitals [07/18/18 1346]  Enc Vitals Group     BP (!) 177/102     Pulse Rate 84     Resp 18     Temp 98.5 F (36.9 C)     Temp Source Oral     SpO2 96 %     Weight 300 lb (136.1 kg)     Height 5\' 10"  (1.778 m)     Head Circumference      Peak Flow      Pain  Score 0     Pain Loc      Pain Edu?      Excl. in GC?     Constitutional: Alert and oriented. Well appearing and in no acute distress. Eyes: Conjunctivae are normal. Head: Atraumatic. Nose: No congestion/rhinnorhea. Mouth/Throat: Mucous membranes are moist. Neck: No stridor.  Cardiovascular: Normal rate, regular rhythm. Grossly normal heart sounds.  Good peripheral circulation. Respiratory: Normal respiratory effort.  No retractions. Lungs CTAB except for moderate end expiratory wheezing.  Speaks in full and clear sentences. Gastrointestinal: Soft and nontender. No distention. Musculoskeletal: No lower extremity tenderness just trace 1+ lower extremity edema bilateral.  No unilateral differences.  No thigh tenderness.  No venous cords or congestion. Neurologic:  Normal speech and language. No gross focal neurologic deficits are appreciated.  Skin:  Skin is warm, dry and intact. No rash noted. Psychiatric: Mood and affect are normal. Speech and behavior are normal.  ____________________________________________   LABS (all labs ordered are listed, but only abnormal results are displayed)  Labs Reviewed  BASIC METABOLIC PANEL - Abnormal; Notable for the following components:      Result Value   Glucose, Bld 130 (*)    All other components within normal limits  CBC  TROPONIN I  BRAIN NATRIURETIC PEPTIDE  FIBRIN DERIVATIVES D-DIMER ([facility] ONLY)   ____________________________________________  EKG  Reviewed and interpreted me at 1359 heart rate 80 QRS 99 QTc 430 Normal sinus rhythm, slight T wave abnormality including inversions in 1 aVL, V5 and V6 is noted.  Appears relatively stable compared to his previous EKG though some slight inversion now present V5 and V6.  However that, the patient reports no chest pain.  Does report some shortness of breath and wheezing, I do not think this necessarily clinically correlates well with a concern for acute  iscehmia. ____________________________________________  RADIOLOGY  Dg Chest 2 View  Result Date: 07/18/2018 CLINICAL DATA:  States SOB and fluid in legs. Denies CHF. Unsure if gained weight. No pitting edema. SOB x 2 days. States cough. Mask in place. No resp distress noted. Hx - HTN, DVT, current smoker 0.5 ppd. EXAM: CHEST - 2 VIEW COMPARISON:  05/10/2018 FINDINGS: Cardiac silhouette is normal in size. No mediastinal or hilar masses. No evidence of adenopathy. Mild prominence of the central bronchial markings similar to the prior exam. Lungs otherwise clear. No pleural effusion or pneumothorax. Skeletal structures are intact. IMPRESSION: No active cardiopulmonary disease. Electronically Signed   By: Amie Portland M.D.   On: 07/18/2018 14:42    X-ray reviewed negative for acute ____________________________________________   PROCEDURES  Procedure(s) performed: None  Procedures  Critical Care performed: No  ____________________________________________   INITIAL  IMPRESSION / ASSESSMENT AND PLAN / ED COURSE  Pertinent labs & imaging results that were available during my care of the patient were reviewed by me and considered in my medical decision making (see chart for details).   Patient presents for evaluation of cough, upper respiratory symptoms starting about 5 to 6 days ago with some slight wheezing with walking and slight feeling of shortness of breath with walking and exertion for the last few days.  Does report he has an inhaler at home, but has not used it is previously taken steroid with improvement of similar symptoms.  Suspect his symptoms are likely viral in etiology, he has lower extremity edema but reports this is stable for several months and is on hydrochlorothiazide as well as calcium channel blocker.  No evidence of DVT.  Does have trace edema in the lower extremities bilateral.  No evidence of CHF.  Troponin negative no chest pain, low risk for acute ACS  Clinical  Course as of Jul 18 2052  Wynelle LinkSun Jul 18, 2018  1759 Patient reports he is feeling better.  Shortness of breath is improved.  Reports he is no longer wheezing.  On examination still just some slight wheezing prior to giving his second neb treatment.  He is resting quite comfortably in no distress with normal work of breathing by exam except for slight end expiratory wheezing which is improvedBlood pressure improved.  Patient reports he is taking hydrochlorothiazide 12.5 mg daily, plan to increase this to 25.  No evidence of CHF by examination or BNP today.  D-dimer is less than 500, no signs or symptoms of pulmonary embolism   [MQ]    Clinical Course User Index [MQ] Sharyn CreamerQuale, Aaronmichael Brumbaugh, MD   ----------------------------------------- 7:55 PM on 07/18/2018 -----------------------------------------  Patient blood pressure noted to be quite elevated at plan for discharge.  He is asymptomatic without chest pain shortness of breath visual changes etc. 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  This is stable.  He has not had his evening medications which she reports are Coreg and losartan, will administer here and recheck in roughly an hour's time to see if his blood pressure is improved. ----------------------------------------- 8:54 PM on 07/18/2018 -----------------------------------------  Left the ER without notifying anyone.  Did not have an IV access in place.  His blood pressure was improved and he was given his medications, and certainly talk to him about need for compliance and close follow-up as well as treatment as we had previously planned to discharge him, but he was staying to see if we would improve his blood pressure and reassess.  Was witnessed to walk to the bathroom, and then when I went to reevaluate him in the room he had left.  ____________________________________________   FINAL CLINICAL IMPRESSION(S) / ED  DIAGNOSES  Final diagnoses:  Acute bronchitis, unspecified organism  Hypertension, unspecified type  Peripheral edema        Note:  This document was prepared using Dragon voice recognition software and may include unintentional dictation errors       Sharyn CreamerQuale, Stephenia Vogan, MD 07/18/18 2055

## 2018-07-18 NOTE — ED Notes (Signed)
Pt left without medical clearance from EDP due to elevated BP.

## 2018-08-01 ENCOUNTER — Emergency Department: Payer: Medicaid Other

## 2018-08-01 ENCOUNTER — Other Ambulatory Visit: Payer: Self-pay

## 2018-08-01 ENCOUNTER — Emergency Department
Admission: EM | Admit: 2018-08-01 | Discharge: 2018-08-01 | Disposition: A | Discharge status: Home / Self Care | Payer: Medicaid Other | Attending: Emergency Medicine | Admitting: Emergency Medicine

## 2018-08-01 DIAGNOSIS — I10 Essential (primary) hypertension: Secondary | ICD-10-CM | POA: Insufficient documentation

## 2018-08-01 DIAGNOSIS — J209 Acute bronchitis, unspecified: Secondary | ICD-10-CM | POA: Diagnosis not present

## 2018-08-01 DIAGNOSIS — Z79899 Other long term (current) drug therapy: Secondary | ICD-10-CM | POA: Insufficient documentation

## 2018-08-01 DIAGNOSIS — R05 Cough: Secondary | ICD-10-CM | POA: Insufficient documentation

## 2018-08-01 DIAGNOSIS — R0602 Shortness of breath: Secondary | ICD-10-CM | POA: Diagnosis present

## 2018-08-01 DIAGNOSIS — R0981 Nasal congestion: Secondary | ICD-10-CM | POA: Insufficient documentation

## 2018-08-01 DIAGNOSIS — F17210 Nicotine dependence, cigarettes, uncomplicated: Secondary | ICD-10-CM | POA: Insufficient documentation

## 2018-08-01 DIAGNOSIS — R509 Fever, unspecified: Secondary | ICD-10-CM | POA: Insufficient documentation

## 2018-08-01 LAB — CBC WITH DIFFERENTIAL/PLATELET
Abs Immature Granulocytes: 0.04 10*3/uL (ref 0.00–0.07)
Basophils Absolute: 0.1 10*3/uL (ref 0.0–0.1)
Basophils Relative: 1 %
Eosinophils Absolute: 0.4 10*3/uL (ref 0.0–0.5)
Eosinophils Relative: 4 %
HEMATOCRIT: 43.1 % (ref 39.0–52.0)
HEMOGLOBIN: 14.5 g/dL (ref 13.0–17.0)
Immature Granulocytes: 0 %
LYMPHS PCT: 19 %
Lymphs Abs: 1.9 10*3/uL (ref 0.7–4.0)
MCH: 30.8 pg (ref 26.0–34.0)
MCHC: 33.6 g/dL (ref 30.0–36.0)
MCV: 91.5 fL (ref 80.0–100.0)
Monocytes Absolute: 0.5 10*3/uL (ref 0.1–1.0)
Monocytes Relative: 6 %
Neutro Abs: 6.9 10*3/uL (ref 1.7–7.7)
Neutrophils Relative %: 70 %
Platelets: 275 10*3/uL (ref 150–400)
RBC: 4.71 MIL/uL (ref 4.22–5.81)
RDW: 11.6 % (ref 11.5–15.5)
WBC: 9.8 10*3/uL (ref 4.0–10.5)
nRBC: 0 % (ref 0.0–0.2)

## 2018-08-01 LAB — COMPREHENSIVE METABOLIC PANEL
ALK PHOS: 71 U/L (ref 38–126)
ALT: 18 U/L (ref 0–44)
AST: 18 U/L (ref 15–41)
Albumin: 4.2 g/dL (ref 3.5–5.0)
Anion gap: 8 (ref 5–15)
BUN: 14 mg/dL (ref 6–20)
CO2: 30 mmol/L (ref 22–32)
CREATININE: 1.31 mg/dL — AB (ref 0.61–1.24)
Calcium: 9.1 mg/dL (ref 8.9–10.3)
Chloride: 99 mmol/L (ref 98–111)
GFR calc Af Amer: >60 mL/min (ref >60)
GFR calc non Af Amer: >60 mL/min (ref >60)
Glucose, Bld: 169 mg/dL — ABNORMAL HIGH (ref 70–99)
Potassium: 3 mmol/L — ABNORMAL LOW (ref 3.5–5.1)
Sodium: 137 mmol/L (ref 135–145)
Total Bilirubin: 1 mg/dL (ref 0.3–1.2)
Total Protein: 7.8 g/dL (ref 6.5–8.1)

## 2018-08-01 LAB — TROPONIN I: Troponin I: <0.03 ng/mL (ref <0.03)

## 2018-08-01 MED ORDER — IPRATROPIUM-ALBUTEROL 0.5-2.5 (3) MG/3ML IN SOLN
3.0000 mL | Freq: Once | RESPIRATORY_TRACT | Status: AC
  Administered 2018-08-01: 3 mL via RESPIRATORY_TRACT
  Filled 2018-08-01: qty 3

## 2018-08-01 MED ORDER — PREDNISONE 20 MG PO TABS
60.0000 mg | ORAL_TABLET | Freq: Once | ORAL | Status: AC
  Administered 2018-08-01: 60 mg via ORAL
  Filled 2018-08-01: qty 3

## 2018-08-01 MED ORDER — IOHEXOL 300 MG/ML  SOLN
100.0000 mL | Freq: Once | INTRAMUSCULAR | Status: DC | PRN
  Filled 2018-08-01: qty 100

## 2018-08-01 MED ORDER — PREDNISONE 10 MG PO TABS
50.0000 mg | ORAL_TABLET | Freq: Every day | ORAL | 0 refills | Status: AC
Start: 2018-08-01 — End: ?

## 2018-08-01 MED ORDER — ALBUTEROL SULFATE HFA 108 (90 BASE) MCG/ACT IN AERS: 2.0000 | INHALATION_SPRAY | RESPIRATORY_TRACT | 2 refills | Status: DC | PRN

## 2018-08-01 MED ORDER — IOHEXOL 350 MG/ML SOLN
75.0000 mL | Freq: Once | INTRAVENOUS | Status: AC | PRN
  Administered 2018-08-01: 75 mL via INTRAVENOUS
  Filled 2018-08-01: qty 75

## 2018-08-01 NOTE — ED Provider Notes (Signed)
[facility] Emergency Department Provider Note  ____________________________________________  Time seen: Approximately 4:27 PM  I have reviewed the triage vital signs and the nursing notes.   HISTORY  Chief Complaint Cough   HPI Ricky Leonard is a 38 y.o. male who presents to the emergency department for treatment and evaluation of shortness of breath, cough and congestion.  He also reports a subjective fever.  2 weeks ago he was treated for bronchitis and felt like he had somewhat improved, however over the past few days he has begin to feel worse.  Main complaint today is shortness of breath.  He denies chest pain or pleurisy.  No alleviating measures have been attempted at home prior to arrival.   He has a significant past medical history of hypertension, left subclavian vein thrombosis, jugular vein thrombosis, and prothrombin gene mutation, and hypertension.    Past Medical History:  Diagnosis Date  . DVT (deep venous thrombosis) (HCC)   . Hypertension     Patient Active Problem List   Diagnosis Date Noted  . 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 Prothrombin gene mutation (HCC) 07/11/2016  . Tobacco use disorder 07/01/2016  . 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 Accelerated hypertension 06/13/2016  . Chest pain 06/13/2016  . Left arm swelling 06/13/2016    Past Surgical History:  Procedure Laterality Date  . NO PAST SURGERIES      Prior to Admission medications   Medication Sig Start Date End Date Taking? Authorizing Provider  albuterol (PROVENTIL HFA;VENTOLIN HFA) 108 (90 Base) MCG/ACT inhaler Inhale 2 puffs into the lungs every 4 (four) hours as needed for shortness of breath. 08/01/18   Chinita Pester, FNP  benzonatate (TESSALON PERLES) 100 MG capsule Take 1-2 tabs TID prn cough 05/11/18   Menshew, Charlesetta Ivory, PA-C  carvedilol (COREG) 12.5 MG tablet Take 1 tablet (12.5 mg total) by mouth 2 (two) times daily with a  meal. 06/16/16   Shaune Pollack, MD  hydrochlorothiazide (HYDRODIURIL) 25 MG tablet Take 1 tablet (25 mg total) by mouth daily. 07/18/18   Sharyn Creamer, MD  omeprazole (PRILOSEC) 20 MG capsule Take 20 mg by mouth daily.    [provider]  predniSONE (DELTASONE) 10 MG tablet Take 5 tablets (50 mg total) by mouth daily. 08/01/18   Chinita Pester, FNP    Allergies Patient has no known allergies.  Family History  Problem Relation Age of Onset  . Hypertension Other   . Diabetes Maternal Grandmother     Social History Social History   Tobacco Use  . Smoking status: Current Every Day Smoker    Packs/day: 0.50    Types: Cigarettes  . Smokeless tobacco: Never Used  Substance Use Topics  . Alcohol use: Yes  . Drug use: No    Review of Systems Constitutional: Positive for fever/chills.  Normal appetite. ENT: No sore throat. Cardiovascular: Denies chest pain.  Denies edema of lower extremities. Respiratory: Positive for shortness of breath.  Positive for cough.  Negative for wheezing.  Gastrointestinal: No nausea, no vomiting.  Negative for diarrhea.  Musculoskeletal: Negative for body aches. Skin: Negative for rash. Neurological: Negative for headaches ____________________________________________   PHYSICAL EXAM:  VITAL SIGNS: ED Triage Vitals  Enc Vitals Group     BP 08/01/18 1538 (!) 151/78     Pulse Rate 08/01/18 1538 80     Resp 08/01/18 1538 16     Temp 08/01/18 1538 98.4 F (36.9 C)  Temp Source 08/01/18 1538 Oral     SpO2 08/01/18 1538 95 %     Weight 08/01/18 1540 300 lb (136.1 kg)     Height 08/01/18 1540 5\' 10"  (1.778 m)     Head Circumference --      Peak Flow --      Pain Score 08/01/18 1542 3     Pain Loc --      Pain Edu? --      Excl. in GC? --     Constitutional: Alert and oriented.  Acutely ill appearing and in no acute distress. Eyes: Conjunctivae are normal. Ears: Bilateral tympanic membranes are normal. Nose: Maxillary sinus congestion  noted; no rhinnorhea. Mouth/Throat: Mucous membranes are moist.  Oropharynx clear. Tonsils not visualized. Uvula midline. Neck: No stridor.  Lymphatic: No cervical lymphadenopathy. Cardiovascular: Normal rate, regular rhythm. Good peripheral circulation.  Homans sign is negative.  Bilateral lower extremities are without edema and appear equal in size. Respiratory: Respirations are even and unlabored.  No retractions.  Rhonchi noted in the left upper lobe, otherwise clear throughout.. Gastrointestinal: Soft and nontender.  Musculoskeletal: FROM x 4 extremities.  Neurologic:  Normal speech and language. Skin:  Skin is warm, dry and intact. No rash noted. Psychiatric: Mood and affect are normal. Speech and behavior are normal.  ____________________________________________   LABS (all labs ordered are listed, but only abnormal results are displayed)  Labs Reviewed  COMPREHENSIVE METABOLIC PANEL - Abnormal; Notable for the following components:      Result Value   Potassium 3.0 (*)    Glucose, Bld 169 (*)    Creatinine, Ser 1.31 (*)    All other components within normal limits  CBC WITH DIFFERENTIAL/PLATELET  TROPONIN I   ____________________________________________  EKG  ED ECG REPORT I, Efraim Vanallen, FNP-BC personally viewed and interpreted this ECG.   Date: 08/01/2018  EKG Time: 6:21 PM  Rate: 76  Rhythm: normal EKG, normal sinus rhythm, unchanged from previous tracings  Axis: Normal  Intervals:none  ST&T Change: T wave depression unchanged from previous  ____________________________________________  RADIOLOGY  Chest x-ray is negative for acute cardiopulmonary abnormality per radiology.  CT chest angios rule out PE is negative for acute findings. ____________________________________________   PROCEDURES  Procedure(s) performed: None  Critical Care performed: No ____________________________________________   INITIAL IMPRESSION / ASSESSMENT AND PLAN / ED  COURSE  38 y.o. male presents to the emergency department for treatment and evaluation of shortness of breath, cough, and other URI symptoms.  Chest x-ray will be completed and DuoNeb given.  Differential diagnosis includes but is not limited to acute URI, bronchitis, CAP, PE, or COPD.   ----------------------------------------- 6:09 PM on 08/01/2018 -----------------------------------------  Chest x-ray is negative.  Ambulatory saturation ranged from 92% on room air to 95% on room air.  Patient was not tachycardic while ambulating.  Upon further questioning of the patient he does now admit to some bilateral lower extremity swelling a week or so ago, but denies any pain in his lower extremities.  With the patient's history of DVT in addition to his shortness of breath and recent lower extremity edema as well as current cigarette smoker it will be necessary to proceed with CT of the chest to rule out PE.  This was discussed with the patient and family and rationale was given.  Patient agrees to testing.  He is not currently on any anticoagulants including aspirin.  ----------------------------------------- 8:11 PM on 08/01/2018 -----------------------------------------  CT angios to rule out PE is  negative.  Results were discussed with the patient and family.  He will be placed on a 5-day burst dose of prednisone and given a refill of his albuterol inhaler.  He was instructed to use the albuterol inhaler 2 puffs every 4 hours for shortness of breath.  He was advised to return to the emergency department immediately if the shortness of breath gets worse or he develops other symptoms such as chest pain.  Patient and family verbalized understanding and denyed any other questions.   Medications  predniSONE (DELTASONE) tablet 60 mg (has no administration in time range)  ipratropium-albuterol (DUONEB) 0.5-2.5 (3) MG/3ML nebulizer solution 3 mL (3 mLs Nebulization Given 08/01/18 1651)  iohexol (OMNIPAQUE)  350 MG/ML injection 75 mL (75 mLs Intravenous Contrast Given 08/01/18 1859)    ED Discharge Orders         Ordered    albuterol (PROVENTIL HFA;VENTOLIN HFA) 108 (90 Base) MCG/ACT inhaler  Every 4 hours PRN     08/01/18 2010    predniSONE (DELTASONE) 10 MG tablet  Daily     08/01/18 2010           Pertinent labs & imaging results that were available during my care of the patient were reviewed by me and considered in my medical decision making (see chart for details).    If controlled substance prescribed during this visit, 12 month history viewed on the [facility] prior to issuing an initial prescription for Schedule II or III opiod. ____________________________________________   FINAL CLINICAL IMPRESSION(S) / ED DIAGNOSES  Final diagnoses:  Acute bronchitis, unspecified organism    Note:  This document was prepared using Dragon voice recognition software and may include unintentional dictation errors.     Chinita Pester, FNP 08/01/18 2014    Dionne Bucy, MD 08/01/18 2238

## 2018-08-01 NOTE — ED Triage Notes (Signed)
Pt states cough and congestion. Mask in place. Denies fever.   A&O, ambulatory.

## 2018-08-01 NOTE — ED Notes (Signed)
Pt's Osat dropped from 95% to 92% while ambulating

## 2018-08-01 NOTE — Discharge Instructions (Signed)
Please follow-up with your primary care provider this week if possible.  Return to the emergency department for symptoms of change or worsen if you are unable to schedule an appointment.

## 2018-09-08 ENCOUNTER — Emergency Department (HOSPITAL_COMMUNITY): Payer: Medicaid Other

## 2018-09-08 ENCOUNTER — Encounter (HOSPITAL_COMMUNITY): Payer: Self-pay | Admitting: *Deleted

## 2018-09-08 ENCOUNTER — Emergency Department (HOSPITAL_COMMUNITY)
Admission: EM | Admit: 2018-09-08 | Discharge: 2018-09-08 | Disposition: A | Discharge status: Home / Self Care | Payer: Medicaid Other | Attending: Emergency Medicine | Admitting: Emergency Medicine

## 2018-09-08 DIAGNOSIS — I10 Essential (primary) hypertension: Secondary | ICD-10-CM | POA: Insufficient documentation

## 2018-09-08 DIAGNOSIS — Z79899 Other long term (current) drug therapy: Secondary | ICD-10-CM | POA: Diagnosis not present

## 2018-09-08 DIAGNOSIS — M7918 Myalgia, other site: Secondary | ICD-10-CM

## 2018-09-08 DIAGNOSIS — F17210 Nicotine dependence, cigarettes, uncomplicated: Secondary | ICD-10-CM | POA: Diagnosis not present

## 2018-09-08 DIAGNOSIS — R030 Elevated blood-pressure reading, without diagnosis of hypertension: Secondary | ICD-10-CM

## 2018-09-08 DIAGNOSIS — M545 Low back pain: Secondary | ICD-10-CM | POA: Diagnosis present

## 2018-09-08 MED ORDER — METHOCARBAMOL 500 MG PO TABS
500.0000 mg | ORAL_TABLET | Freq: Two times a day (BID) | ORAL | 0 refills | Status: AC
Start: 2018-09-08 — End: ?

## 2018-09-08 MED ORDER — NAPROXEN 375 MG PO TABS: 375.0000 mg | ORAL_TABLET | Freq: Two times a day (BID) | ORAL | 0 refills | Status: DC
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
## 2018-11-18 ENCOUNTER — Encounter: Payer: Self-pay | Admitting: Emergency Medicine

## 2018-11-18 ENCOUNTER — Emergency Department: Payer: Medicaid Other

## 2018-11-18 ENCOUNTER — Emergency Department
Admission: EM | Admit: 2018-11-18 | Discharge: 2018-11-18 | Disposition: A | Discharge status: Home / Self Care | Payer: Medicaid Other | Attending: Emergency Medicine | Admitting: Emergency Medicine

## 2018-11-18 ENCOUNTER — Other Ambulatory Visit: Payer: Self-pay

## 2018-11-18 DIAGNOSIS — I10 Essential (primary) hypertension: Secondary | ICD-10-CM | POA: Diagnosis not present

## 2018-11-18 DIAGNOSIS — F17210 Nicotine dependence, cigarettes, uncomplicated: Secondary | ICD-10-CM | POA: Insufficient documentation

## 2018-11-18 DIAGNOSIS — Z79899 Other long term (current) drug therapy: Secondary | ICD-10-CM | POA: Diagnosis not present

## 2018-11-18 DIAGNOSIS — Y939 Activity, unspecified: Secondary | ICD-10-CM | POA: Insufficient documentation

## 2018-11-18 DIAGNOSIS — Y999 Unspecified external cause status: Secondary | ICD-10-CM | POA: Diagnosis not present

## 2018-11-18 DIAGNOSIS — X58XXXA Exposure to other specified factors, initial encounter: Secondary | ICD-10-CM | POA: Insufficient documentation

## 2018-11-18 DIAGNOSIS — R0789 Other chest pain: Secondary | ICD-10-CM

## 2018-11-18 DIAGNOSIS — S29011A Strain of muscle and tendon of front wall of thorax, initial encounter: Secondary | ICD-10-CM | POA: Insufficient documentation

## 2018-11-18 DIAGNOSIS — Y929 Unspecified place or not applicable: Secondary | ICD-10-CM | POA: Insufficient documentation

## 2018-11-18 DIAGNOSIS — R079 Chest pain, unspecified: Secondary | ICD-10-CM | POA: Diagnosis present

## 2018-11-18 LAB — TROPONIN I: Troponin I: <0.03 ng/mL (ref <0.03)

## 2018-11-18 LAB — BASIC METABOLIC PANEL
Anion gap: 9 (ref 5–15)
BUN: 16 mg/dL (ref 6–20)
CO2: 25 mmol/L (ref 22–32)
Calcium: 9.3 mg/dL (ref 8.9–10.3)
Chloride: 102 mmol/L (ref 98–111)
Creatinine, Ser: 1.35 mg/dL — ABNORMAL HIGH (ref 0.61–1.24)
GFR calc Af Amer: >60 mL/min (ref >60)
GFR calc non Af Amer: >60 mL/min (ref >60)
Glucose, Bld: 161 mg/dL — ABNORMAL HIGH (ref 70–99)
Potassium: 3.3 mmol/L — ABNORMAL LOW (ref 3.5–5.1)
Sodium: 136 mmol/L (ref 135–145)

## 2018-11-18 LAB — CBC
HCT: 42.6 % (ref 39.0–52.0)
Hemoglobin: 15 g/dL (ref 13.0–17.0)
MCH: 31.6 pg (ref 26.0–34.0)
MCHC: 35.2 g/dL (ref 30.0–36.0)
MCV: 89.7 fL (ref 80.0–100.0)
Platelets: 257 10*3/uL (ref 150–400)
RBC: 4.75 MIL/uL (ref 4.22–5.81)
RDW: 11.7 % (ref 11.5–15.5)
WBC: 5.6 10*3/uL (ref 4.0–10.5)
nRBC: 0 % (ref 0.0–0.2)

## 2018-11-18 LAB — FIBRIN DERIVATIVES D-DIMER (ARMC ONLY): Fibrin derivatives D-dimer (ARMC): 474.96 ng/mL (FEU) (ref 0.00–499.00)

## 2018-11-18 MED ORDER — ACETAMINOPHEN 325 MG PO TABS: 650.0000 mg | ORAL_TABLET | Freq: Four times a day (QID) | ORAL | 0 refills | Status: DC | PRN

## 2018-11-18 MED ORDER — ASPIRIN EC 325 MG PO TBEC: 325.0000 mg | DELAYED_RELEASE_TABLET | Freq: Four times a day (QID) | ORAL | 0 refills | Status: DC | PRN

## 2018-11-18 NOTE — Discharge Instructions (Signed)
Your EKG, lab tests, and chest x-ray were all okay today.  Continue monitoring your symptoms and follow-up with your primary care doctor.
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
## 2018-11-18 NOTE — ED Provider Notes (Signed)
[facility] Emergency Department Provider Note  ____________________________________________  Time seen: Approximately 7:52 AM  I have reviewed the triage vital signs and the nursing notes.   HISTORY  Chief Complaint Chest Pain    HPI Ricky Leonard is a 38 y.o. male with a history of hypertension and jugular vein thrombosis who comes to the ED today complaining of right-sided chest pain for the past week, constant, worse with movement or occasionally deep breathing.  Alleviated by being still.  Denies shortness of breath radiation vomiting or diaphoresis.  No palpitations or dizziness or syncope.  Not exertional.  Not pleuritic  Does note that recently he has been taking out garbage bags and having to keep them up into a dumpster at work.  No other significant strenuous activity.  No fevers chills sweats cough or sore throat.  He has a chronic runny nose which he attributes to seasonal allergies and for which he takes loratadine.     Past Medical History:  Diagnosis Date  . DVT (deep venous thrombosis) (HCC)   . Hypertension      Patient Active Problem List   Diagnosis Date Noted  . 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 Prothrombin gene mutation (HCC) 07/11/2016  . Tobacco use disorder 07/01/2016  . 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 Accelerated hypertension 06/13/2016  . Chest pain 06/13/2016  . Left arm swelling 06/13/2016     Past Surgical History:  Procedure Laterality Date  . NO PAST SURGERIES       Prior to Admission medications   Medication Sig Start Date End Date Taking? Authorizing Provider  acetaminophen (TYLENOL) 325 MG tablet Take 2 tablets (650 mg total) by mouth every 6 (six) hours as needed for mild pain or moderate pain. 11/18/18   Sharman Cheek, MD  albuterol (PROVENTIL HFA;VENTOLIN HFA) 108 (90 Base) MCG/ACT inhaler Inhale 2 puffs into the lungs every 4 (four) hours as needed for shortness of  breath. 08/01/18   Triplett, Rulon Eisenmenger B, FNP  aspirin EC 325 MG tablet Take 1 tablet (325 mg total) by mouth every 6 (six) hours as needed for mild pain or moderate pain. 11/18/18   Sharman Cheek, MD  benzonatate (TESSALON PERLES) 100 MG capsule Take 1-2 tabs TID prn cough 05/11/18   Menshew, Charlesetta Ivory, PA-C  carvedilol (COREG) 12.5 MG tablet Take 1 tablet (12.5 mg total) by mouth 2 (two) times daily with a meal. 06/16/16   Shaune Pollack, MD  hydrochlorothiazide (HYDRODIURIL) 25 MG tablet Take 1 tablet (25 mg total) by mouth daily. 07/18/18   Sharyn Creamer, MD  methocarbamol (ROBAXIN) 500 MG tablet Take 1 tablet (500 mg total) by mouth 2 (two) times daily. 09/08/18   Harlene Salts A, PA-C  omeprazole (PRILOSEC) 20 MG capsule Take 20 mg by mouth daily.    [provider]  predniSONE (DELTASONE) 10 MG tablet Take 5 tablets (50 mg total) by mouth daily. 08/01/18   Chinita Pester, FNP     Allergies Patient has no known allergies.   Family History  Problem Relation Age of Onset  . Hypertension Other   . Diabetes Maternal Grandmother     Social History Social History   Tobacco Use  . Smoking status: Current Every Day Smoker    Packs/day: 0.50    Types: Cigarettes  . Smokeless tobacco: Never Used  Substance Use Topics  . Alcohol use: Yes  . Drug use: No    Review of Systems  Constitutional:  No fever or chills.  ENT:   No sore throat.  Positive as above chronic rhinorrhea. Cardiovascular: Positive as above atypical chest pain without syncope. Respiratory:   No dyspnea or cough. Gastrointestinal:   Negative for abdominal pain, vomiting and diarrhea.  Musculoskeletal:   Negative for focal pain or swelling All other systems reviewed and are negative except as documented above in ROS and HPI.  ____________________________________________   PHYSICAL EXAM:  VITAL SIGNS: ED Triage Vitals  Enc Vitals Group     BP 11/18/18 0618 (!) 165/107     Pulse Rate 11/18/18 0618 83      Resp 11/18/18 0618 18     Temp 11/18/18 0618 98 F (36.7 C)     Temp Source 11/18/18 0618 Oral     SpO2 11/18/18 0618 98 %     Weight 11/18/18 0608 280 lb (127 kg)     Height 11/18/18 0608  (1.778 m)     Head Circumference --      Peak Flow --      Pain Score 11/18/18 0608 7     Pain Loc --      Pain Edu? --      Excl. in GC? --     Vital signs reviewed, nursing assessments reviewed.   Constitutional:   Alert and oriented. Non-toxic appearance. Eyes:   Conjunctivae are normal. EOMI. PERRL. ENT      Head:   Normocephalic and atraumatic.      Nose:   No congestion/rhinnorhea.       Mouth/Throat:   MMM, no pharyngeal erythema. No peritonsillar mass.       Neck:   No meningismus. Full ROM. Hematological/Lymphatic/Immunilogical:   No cervical lymphadenopathy. Cardiovascular:   RRR. Symmetric bilateral radial and DP pulses.  No murmurs. Cap refill less than 2 seconds. Respiratory:   Normal respiratory effort without tachypnea/retractions. Breath sounds are clear and equal bilaterally. No wheezes/rales/rhonchi. Gastrointestinal:   Soft and nontender. Non distended. There is no CVA tenderness.  No rebound, rigidity, or guarding. Genitourinary:   deferred Musculoskeletal:   Normal range of motion in all extremities. No joint effusions.  No lower extremity tenderness.  No edema.  Right chest wall tender over the fifth intercostal space anteriorly, reproducing his pain. Neurologic:   Normal speech and language.  Motor grossly intact. No acute focal neurologic deficits are appreciated.  Skin:    Skin is warm, dry and intact. No rash noted.  No petechiae, purpura, or bullae.  ____________________________________________    LABS (pertinent positives/negatives) (all labs ordered are listed, but only abnormal results are displayed) Labs Reviewed  BASIC METABOLIC PANEL - Abnormal; Notable for the following components:      Result Value   Potassium 3.3 (*)    Glucose, Bld 161  (*)    Creatinine, Ser 1.35 (*)    All other components within normal limits  CBC  TROPONIN I  FIBRIN DERIVATIVES D-DIMER ([facility] ONLY)   ____________________________________________   EKG  Interpreted by me Sinus rhythm rate of 72, normal axis and intervals.  Normal QRS ST segments.  Suspicious for evolving LVH with associated repolarization abnormality of T wave inversions in the lateral leads.  This is completely unchanged compared to previous EKG on August 01, 2018.  ____________________________________________    RADIOLOGY  Dg Chest Port 1 View  Result Date: 11/18/2018 CLINICAL DATA:  Chest pain EXAM: PORTABLE CHEST 1 VIEW COMPARISON:  08/01/2018 FINDINGS: Normal heart size and mediastinal contours. No acute infiltrate  or edema. No effusion or pneumothorax. No acute osseous findings. IMPRESSION: Negative chest. Electronically Signed   By: Marnee SpringJonathon  Watts M.D.   On: 11/18/2018 06:54    ____________________________________________   PROCEDURES Procedures  ____________________________________________    CLINICAL IMPRESSION / ASSESSMENT AND PLAN / ED COURSE  Medications ordered in the ED: Medications - No data to display  Pertinent labs & imaging results that were available during my care of the patient were reviewed by me and considered in my medical decision making (see chart for details).  Ricky Leonard was evaluated in Emergency Department on 11/18/2018 for the symptoms described in the history of present illness. He was evaluated in the context of the global COVID-19 pandemic, which necessitated consideration that the patient might be at risk for infection with the SARS-CoV-2 virus that causes COVID-19. Institutional protocols and algorithms that pertain to the evaluation of patients at risk for COVID-19 are in a state of rapid change based on information released by regulatory bodies including the CDC and federal and state organizations. These policies and algorithms  were followed during the patient's care in the ED.   Patient presents with atypical chest pain, most consistent with chest wall pain/intercostal muscle strain.  Exam is benign and consistent with musculoskeletal etiology.Considering the patient's symptoms, medical history, and physical examination today, I have low suspicion for ACS, PE, TAD, pneumothorax, carditis, mediastinitis, pneumonia, CHF, or sepsis.  Given normal vital signs, heart rate of 65, no evidence of right heart strain on EKG, not pleuritic, chance of VTE is extremely low.  D-dimer is negative as is chest x-ray and other labs.  Troponin was sent in accordance with the nursing protocol, but not clinically indicated and not requiring any further cardiac work-up.        ____________________________________________   FINAL CLINICAL IMPRESSION(S) / ED DIAGNOSES    Final diagnoses:  Chest wall pain  Intercostal muscle strain, initial encounter     ED Discharge Orders         Ordered    acetaminophen (TYLENOL) 325 MG tablet  Every 6 hours PRN     11/18/18 0752    aspirin EC 325 MG tablet  Every 6 hours PRN     11/18/18 0752          Portions of this note were generated with dragon dictation software. Dictation errors may occur despite best attempts at proofreading.   Sharman CheekStafford, Masha Orbach, MD 11/18/18 [phone]

## 2019-04-16 ENCOUNTER — Encounter: Payer: Self-pay | Admitting: Emergency Medicine

## 2019-04-16 ENCOUNTER — Other Ambulatory Visit: Payer: Self-pay

## 2019-04-16 ENCOUNTER — Emergency Department
Admission: EM | Admit: 2019-04-16 | Discharge: 2019-04-17 | Disposition: A | Discharge status: Home / Self Care | Payer: Medicaid Other | Attending: Emergency Medicine | Admitting: Emergency Medicine

## 2019-04-16 ENCOUNTER — Emergency Department: Payer: Medicaid Other

## 2019-04-16 DIAGNOSIS — F17210 Nicotine dependence, cigarettes, uncomplicated: Secondary | ICD-10-CM | POA: Insufficient documentation

## 2019-04-16 DIAGNOSIS — R0602 Shortness of breath: Secondary | ICD-10-CM | POA: Insufficient documentation

## 2019-04-16 DIAGNOSIS — M791 Myalgia, unspecified site: Secondary | ICD-10-CM | POA: Diagnosis present

## 2019-04-16 DIAGNOSIS — Z79899 Other long term (current) drug therapy: Secondary | ICD-10-CM | POA: Diagnosis not present

## 2019-04-16 DIAGNOSIS — Z20828 Contact with and (suspected) exposure to other viral communicable diseases: Secondary | ICD-10-CM | POA: Insufficient documentation

## 2019-04-16 DIAGNOSIS — B9789 Other viral agents as the cause of diseases classified elsewhere: Secondary | ICD-10-CM | POA: Diagnosis not present

## 2019-04-16 DIAGNOSIS — Z20822 Contact with and (suspected) exposure to covid-19: Secondary | ICD-10-CM

## 2019-04-16 DIAGNOSIS — I10 Essential (primary) hypertension: Secondary | ICD-10-CM | POA: Insufficient documentation

## 2019-04-16 DIAGNOSIS — J988 Other specified respiratory disorders: Secondary | ICD-10-CM | POA: Diagnosis not present

## 2019-04-16 HISTORY — DX: Obesity, unspecified: E66.9

## 2019-04-16 LAB — CBC WITH DIFFERENTIAL/PLATELET
Abs Immature Granulocytes: 0.03 10*3/uL (ref 0.00–0.07)
Basophils Absolute: 0.1 10*3/uL (ref 0.0–0.1)
Basophils Relative: 1 %
Eosinophils Absolute: 0.8 10*3/uL — ABNORMAL HIGH (ref 0.0–0.5)
Eosinophils Relative: 9 %
HCT: 44.3 % (ref 39.0–52.0)
Hemoglobin: 14.9 g/dL (ref 13.0–17.0)
Immature Granulocytes: 0 %
Lymphocytes Relative: 19 %
Lymphs Abs: 1.5 10*3/uL (ref 0.7–4.0)
MCH: 30.7 pg (ref 26.0–34.0)
MCHC: 33.6 g/dL (ref 30.0–36.0)
MCV: 91.2 fL (ref 80.0–100.0)
Monocytes Absolute: 0.6 10*3/uL (ref 0.1–1.0)
Monocytes Relative: 8 %
Neutro Abs: 4.9 10*3/uL (ref 1.7–7.7)
Neutrophils Relative %: 63 %
Platelets: 237 10*3/uL (ref 150–400)
RBC: 4.86 MIL/uL (ref 4.22–5.81)
RDW: 11.8 % (ref 11.5–15.5)
WBC: 8 10*3/uL (ref 4.0–10.5)
nRBC: 0 % (ref 0.0–0.2)

## 2019-04-16 LAB — BASIC METABOLIC PANEL
Anion gap: 9 (ref 5–15)
BUN: 14 mg/dL (ref 6–20)
CO2: 29 mmol/L (ref 22–32)
Calcium: 9.6 mg/dL (ref 8.9–10.3)
Chloride: 99 mmol/L (ref 98–111)
Creatinine, Ser: 1.14 mg/dL (ref 0.61–1.24)
GFR calc Af Amer: >60 mL/min (ref >60)
GFR calc non Af Amer: >60 mL/min (ref >60)
Glucose, Bld: 162 mg/dL — ABNORMAL HIGH (ref 70–99)
Potassium: 3.2 mmol/L — ABNORMAL LOW (ref 3.5–5.1)
Sodium: 137 mmol/L (ref 135–145)

## 2019-04-16 LAB — TROPONIN I (HIGH SENSITIVITY): Troponin I (High Sensitivity): 5 ng/L (ref <18)

## 2019-04-16 MED ORDER — DEXAMETHASONE 10 MG/ML FOR PEDIATRIC ORAL USE
10.0000 mg | Freq: Once | INTRAMUSCULAR | Status: AC
  Administered 2019-04-17: 10 mg via ORAL
  Filled 2019-04-16: qty 1

## 2019-04-16 MED ORDER — POTASSIUM CHLORIDE CRYS ER 20 MEQ PO TBCR
40.0000 meq | EXTENDED_RELEASE_TABLET | Freq: Once | ORAL | Status: AC
  Administered 2019-04-17: 40 meq via ORAL
  Filled 2019-04-16: qty 2

## 2019-04-16 NOTE — Discharge Instructions (Addendum)
As we discussed, we believe your symptoms are caused by a respiratory virus.  However, because we cannot rule out the possibility of COVID-19 at this time, we recommend that you self-quarantine at home for 14 days, or until 3 consecutive days without fever (without taking medication to make your temperature come down, such as Tylenol (acetaminophen), after your respiratory symptoms have improved, and after at least 7 days have passed since your symptoms first appeared.  You should have as minimal contact as possible with anyone else including close family as per the [facility] paperwork guidelines listed below. Follow-up with your doctor by phone or online as needed and return immediately to the emergency department or call 911 only if you develop new or worsening symptoms that concern you.  You can find up-to-date information about COVID-19 in [place] by calling the [facility]: 5014145325. You may also call 2-1-1, or [phone], or additional resources.  You can also find information online at https://miller-white.com/, or on the Center for Disease Control (CDC) website at BeginnerSteps.be.

## 2019-04-16 NOTE — ED Provider Notes (Signed)
[facility] Emergency Department Provider Note  ____________________________________________   First MD Initiated Contact with Patient 04/16/19 2303     (approximate)  I have reviewed the triage vital signs and the nursing notes.   HISTORY  Chief Complaint Generalized Body Aches and Shortness of Breath    HPI Ricky Leonard is a 38 y.o. male with medical history as listed below who presents for evaluation of generalized weakness, nasal congestion, general malaise and body aches for about 2 days.  Symptoms have gotten slightly worse over the last couple of days and he has a little bit of mild shortness of breath with exertion.  At rest he is asymptomatic.  He has had no known contact with COVID-19 positive patients.  He has had no shortness of breath and no loss of smell or taste.  He denies chest pain, nausea, vomiting, abdominal pain, and dysuria.  Exertion makes his symptoms worse and nothing in particular makes them better except for rest.  He has not tried taking any medicine.  He smokes every day but has no diagnosis of asthma nor COPD.  He has a history of DVT but was taken off blood thinners a couple of years ago and told he does not need to take them anymore.        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 Authorizing Provider  acetaminophen (TYLENOL) 325 MG tablet Take 2 tablets (650 mg total) by  mouth every 6 (six) hours as needed for mild pain or moderate pain. 11/18/18   Sharman CheekStafford, Phillip, MD  albuterol (VENTOLIN HFA) 108 (90 Base) MCG/ACT inhaler Inhale 2-4 puffs by mouth every 4 hours as needed for wheezing, cough, and/or shortness of breath 04/17/19   Loleta RoseForbach, Laurisa Sahakian, MD  aspirin EC 325 MG tablet Take 1 tablet (325 mg total) by mouth every 6 (six) hours as needed for mild pain or moderate pain. 11/18/18   Sharman CheekStafford, Phillip, MD  benzonatate (TESSALON PERLES) 100 MG capsule Take 1-2 tabs TID prn cough Patient not taking: Reported on 11/18/2018 05/11/18   Menshew, Charlesetta IvoryJenise V Bacon, PA-C  buPROPion (WELLBUTRIN) 100 MG tablet Take 100 mg by mouth 2 (two) times a day.    [provider]  carvedilol (COREG) 12.5 MG tablet Take 1 tablet (12.5 mg total) by mouth 2 (two) times daily with a meal. 06/16/16   Shaune Pollackhen, Qing, MD  hydrochlorothiazide (HYDRODIURIL) 25 MG tablet Take 1 tablet (25 mg total) by mouth daily. 07/18/18   Sharyn CreamerQuale, Mark, MD  HYDROcodone-homatropine (HYCODAN) 5-1.5 MG/5ML syrup Take 5 mLs by mouth every 6 (six) hours as needed for cough. 04/17/19   Loleta RoseForbach, Shela Esses, MD  losartan (COZAAR) 25 MG tablet Take 25 mg by mouth at bedtime.    [provider]  methocarbamol (ROBAXIN) 500 MG tablet Take 1 tablet (500 mg total) by mouth 2 (two) times daily. Patient not taking: Reported  on 11/18/2018 09/08/18   Harlene Salts A, PA-C  predniSONE (DELTASONE) 10 MG tablet Take 5 tablets (50 mg total) by mouth daily. Patient not taking: Reported on 11/18/2018 08/01/18   Chinita Pester, FNP    Allergies Patient has no known allergies.  Family History  Problem Relation Age of Onset   Hypertension Other    Diabetes Maternal Grandmother     Social History Social History   Tobacco Use   Smoking status: Current Every Day Smoker    Packs/day: 0.50    Types: Cigarettes   Smokeless tobacco: Never Used  Substance Use Topics   Alcohol use: Yes   Drug use: No    Review of  Systems Constitutional: No fever/chills.  Body aches and general malaise/fatigue. Eyes: No visual changes. ENT: No sore throat.  Nasal congestion and runny nose. Cardiovascular: Denies chest pain. Respiratory: Shortness of breath with exertion. Gastrointestinal: No abdominal pain.  No nausea, no vomiting.  No diarrhea.  No constipation. Genitourinary: Negative for dysuria. Musculoskeletal: Negative for neck pain.  Negative for back pain. Integumentary: Negative for rash. Neurological: Negative for headaches, focal weakness or numbness.   ____________________________________________   PHYSICAL EXAM:  VITAL SIGNS: ED Triage Vitals  Enc Vitals Group     BP 04/16/19 2048 (!) 176/95     Pulse Rate 04/16/19 2048 99     Resp 04/16/19 2048 20     Temp 04/16/19 2048 98.6 F (37 C)     Temp Source 04/16/19 2048 Oral     SpO2 04/16/19 2048 95 %     Weight 04/16/19 2049 129.3 kg (285 lb)     Height 04/16/19 2049 1.778 m (5\' 10" )     Head Circumference --      Peak Flow --      Pain Score 04/16/19 2048 7     Pain Loc --      Pain Edu? --      Excl. in GC? --     Constitutional: Alert and oriented.  Generally well-appearing and in no distress, nontoxic appearance. Eyes: Conjunctivae are normal.  Head: Atraumatic. Nose: No congestion/rhinnorhea. Mouth/Throat: Mucous membranes are moist. Neck: No stridor.  No meningeal signs.   Cardiovascular: Normal rate, regular rhythm. Good peripheral circulation. Grossly normal heart sounds. Respiratory: Normal respiratory effort.  No retractions. Gastrointestinal: Obese.  Soft and nontender. No distention.  Musculoskeletal: No lower extremity tenderness nor edema. No gross deformities of extremities. Neurologic:  Normal speech and language. No gross focal neurologic deficits are appreciated.  Skin:  Skin is warm, dry and intact. Psychiatric: Mood and affect are normal. Speech and behavior are  normal.  ____________________________________________   LABS (all labs ordered are listed, but only abnormal results are displayed)  Labs Reviewed  CBC WITH DIFFERENTIAL/PLATELET - Abnormal; Notable for the following components:      Result Value   Eosinophils Absolute 0.8 (*)    All other components within normal limits  BASIC METABOLIC PANEL - Abnormal; Notable for the following components:   Potassium 3.2 (*)    Glucose, Bld 162 (*)    All other components within normal limits  SARS CORONAVIRUS 2 (TAT 6-24 HRS)  TROPONIN I (HIGH SENSITIVITY)   ____________________________________________  EKG  ED ECG REPORT I, 06/16/19, the attending physician, personally viewed and interpreted this ECG.  Date: 04/16/2019 EKG Time: 20: 51 Rate: 90 Rhythm: normal sinus rhythm QRS Axis: normal Intervals: normal ST/T Wave abnormalities: Inverted T waves in leads I and aVL,  otherwise unremarkable narrative Interpretation: no definitive evidence of acute ischemia; does not meet STEMI criteria.  ____________________________________________  RADIOLOGY I, Loleta Rose, personally viewed and evaluated these images (plain radiographs) as part of my medical decision making, as well as reviewing the written report by the radiologist.  ED MD interpretation: Clear chest x-ray with no evidence of acute infection or other abnormality  Official radiology report(s): Dg Chest 2 View  Result Date: 04/16/2019 CLINICAL DATA:  Shortness of breath EXAM: CHEST - 2 VIEW COMPARISON:  Nov 18, 2018 FINDINGS: The heart size and mediastinal contours are within normal limits. Both lungs are clear. The visualized skeletal structures are unremarkable. IMPRESSION: No acute cardiopulmonary process. Electronically Signed   By: Jonna Clark M.D.   On: 04/16/2019 21:24    ____________________________________________   PROCEDURES   Procedure(s) performed (including Critical  Care):  Procedures   ____________________________________________   INITIAL IMPRESSION / MDM / ASSESSMENT AND PLAN / ED COURSE  As part of my medical decision making, I reviewed the following data within the electronic MEDICAL RECORD NUMBER Nursing notes reviewed and incorporated, Labs reviewed , EKG interpreted , Old chart reviewed, Radiograph reviewed , Notes from prior ED visits and [place] Controlled Substance Database   Differential diagnosis includes, but is not limited to, respiratory viral infection including the possibility of COVID-19, pneumonia, less likely PE.  Patient's chest x-ray is clear, vital signs are stable with no tachycardia nor hypoxemia, labs are all within normal limits.  Low risk for ACS based on HEAR score and the patient is denying any chest pain.  Since he has a history of DVT it is unclear how to apply the [facility] score, but he is having no chest pain, no shortness of breath except with exertion, has no tachycardia, no hypoxemia, no unilateral leg pain or swelling, no unilateral arm pain or swelling, and I think he is very low risk for PE and would not benefit from a CTA chest.  Additionally, a d-dimer would likely be elevated in the setting of what appears to be a viral respiratory infection and quite probably COVID-19.  However he does not meet any admission criteria and he is comfortable with the plan for isolation at home.  I will send a coronavirus nasal swab test and he knows to check my chart within the next couple of days for the result.  I sent a prescription to the pharmacy for albuterol inhaler and cough medicine, and I gave him a dose of dexamethasone 10 mg p.o. and a potassium repletion 40 mEq by mouth since his potassium is a little bit low but it is noncontributory to his clinical picture tonight given that he is having no vomiting or diarrhea.  I encouraged him to stay hydrated and read through the included information and I gave my usual and customary suspected COVID-19  infection return precautions.  He understands and agrees with the plan.          ____________________________________________  FINAL CLINICAL IMPRESSION(S) / ED DIAGNOSES  Final diagnoses:  Suspected COVID-19 virus infection  Viral respiratory illness     MEDICATIONS GIVEN DURING THIS VISIT:  Medications  dexamethasone (DECADRON) 10 MG/ML injection for Pediatric ORAL use 10 mg (10 mg Oral Given 04/17/19 0010)  potassium chloride SA (KLOR-CON) CR tablet 40 mEq (40 mEq Oral Given 04/17/19 0011)     ED Discharge Orders         Ordered    albuterol (VENTOLIN HFA) 108 (90 Base) MCG/ACT inhaler  04/17/19 0028    HYDROcodone-homatropine (HYCODAN) 5-1.5 MG/5ML syrup  Every 6 hours PRN     04/17/19 0028          *Please note:  Acheron A Jasko was evaluated in Emergency Department on 04/17/2019 for the symptoms described in the history of present illness. He was evaluated in the context of the global COVID-19 pandemic, which necessitated consideration that the patient might be at risk for infection with the SARS-CoV-2 virus that causes COVID-19. Institutional protocols and algorithms that pertain to the evaluation of patients at risk for COVID-19 are in a state of rapid change based on information released by regulatory bodies including the CDC and federal and state organizations. These policies and algorithms were followed during the patient's care in the ED.  Some ED evaluations and interventions may be delayed as a result of limited staffing during the pandemic.*  Note:  This document was prepared using Dragon voice recognition software and may include unintentional dictation errors.   Hinda Kehr, MD 04/17/19 [phone]

## 2019-04-16 NOTE — ED Triage Notes (Signed)
Pt arrives POV to triage with c/o runny nose and generalized body aches x 2 days. Pt also states that he is having SOB. Pt is in NAD.

## 2019-04-17 ENCOUNTER — Encounter: Payer: Self-pay | Admitting: Emergency Medicine

## 2019-04-17 LAB — SARS CORONAVIRUS 2 (TAT 6-24 HRS): SARS Coronavirus 2: NEGATIVE

## 2019-04-17 MED ORDER — ALBUTEROL SULFATE HFA 108 (90 BASE) MCG/ACT IN AERS: INHALATION_SPRAY | RESPIRATORY_TRACT | 0 refills | Status: AC

## 2019-04-17 MED ORDER — HYDROCODONE-HOMATROPINE 5-1.5 MG/5ML PO SYRP: 5.0000 mL | ORAL_SOLUTION | Freq: Four times a day (QID) | ORAL | 0 refills | Status: DC | PRN

## 2019-08-07 ENCOUNTER — Emergency Department
Admission: EM | Admit: 2019-08-07 | Discharge: 2019-08-07 | Disposition: A | Discharge status: Home / Self Care | Payer: Medicaid Other | Attending: Student | Admitting: Student

## 2019-08-07 ENCOUNTER — Emergency Department: Payer: Medicaid Other

## 2019-08-07 DIAGNOSIS — Y999 Unspecified external cause status: Secondary | ICD-10-CM | POA: Diagnosis not present

## 2019-08-07 DIAGNOSIS — I10 Essential (primary) hypertension: Secondary | ICD-10-CM | POA: Diagnosis not present

## 2019-08-07 DIAGNOSIS — X500XXA Overexertion from strenuous movement or load, initial encounter: Secondary | ICD-10-CM | POA: Diagnosis not present

## 2019-08-07 DIAGNOSIS — Y9289 Other specified places as the place of occurrence of the external cause: Secondary | ICD-10-CM | POA: Insufficient documentation

## 2019-08-07 DIAGNOSIS — Y9389 Activity, other specified: Secondary | ICD-10-CM | POA: Diagnosis not present

## 2019-08-07 DIAGNOSIS — Z79899 Other long term (current) drug therapy: Secondary | ICD-10-CM | POA: Insufficient documentation

## 2019-08-07 DIAGNOSIS — M545 Low back pain, unspecified: Secondary | ICD-10-CM

## 2019-08-07 DIAGNOSIS — R062 Wheezing: Secondary | ICD-10-CM | POA: Diagnosis not present

## 2019-08-07 DIAGNOSIS — R030 Elevated blood-pressure reading, without diagnosis of hypertension: Secondary | ICD-10-CM

## 2019-08-07 DIAGNOSIS — R350 Frequency of micturition: Secondary | ICD-10-CM | POA: Diagnosis not present

## 2019-08-07 DIAGNOSIS — F17210 Nicotine dependence, cigarettes, uncomplicated: Secondary | ICD-10-CM | POA: Diagnosis not present

## 2019-08-07 LAB — URINALYSIS, COMPLETE (UACMP) WITH MICROSCOPIC
Bacteria, UA: NONE SEEN
Bilirubin Urine: NEGATIVE
Glucose, UA: NEGATIVE mg/dL
Hgb urine dipstick: NEGATIVE
Ketones, ur: NEGATIVE mg/dL
Nitrite: NEGATIVE
Protein, ur: 30 mg/dL — AB
Specific Gravity, Urine: 1.026 (ref 1.005–1.030)
pH: 5 (ref 5.0–8.0)

## 2019-08-07 MED ORDER — ACETAMINOPHEN 500 MG PO TABS: 500.0000 mg | ORAL_TABLET | Freq: Four times a day (QID) | ORAL | 0 refills | Status: DC | PRN

## 2019-08-07 MED ORDER — LIDOCAINE 5 % EX PTCH
1.0000 | MEDICATED_PATCH | CUTANEOUS | 0 refills | Status: AC
Start: 2019-08-07 — End: ?

## 2019-08-07 MED ORDER — TRAMADOL HCL 50 MG PO TABS: 50.0000 mg | ORAL_TABLET | Freq: Four times a day (QID) | ORAL | 0 refills | Status: AC | PRN

## 2019-08-07 MED ORDER — ACETAMINOPHEN 325 MG PO TABS
650.0000 mg | ORAL_TABLET | Freq: Once | ORAL | Status: AC
  Administered 2019-08-07: 650 mg via ORAL
  Filled 2019-08-07: qty 2

## 2019-08-07 NOTE — ED Triage Notes (Signed)
Patient has complaints of lower back pain x 1 month. States it probably happened at work but he doesn't really recall. States in the last two weeks it has gotten "back to where it was before."

## 2019-08-07 NOTE — ED Provider Notes (Signed)
[facility] Emergency Department Provider Note  ____________________________________________  Time seen: Approximately 8:25 PM  I have reviewed the triage vital signs and the nursing notes.   HISTORY  Chief Complaint Back Pain (x 1 month)    HPI Ricky Leonard is a 39 y.o. male that presents to the emergency department for evaluation of low back pain for 1 month.  Patient states that he initially thought he pulled something while lifting trash at work.  Back pain improved but then returned.  Back pain does not radiate.  Pain is worse with movement.  Patient has noticed an increase in frequency of urination.  No dysuria, hematuria.  No penile discharge or exposure to STD.  He has never had a kidney stone.  No bowel or bladder incontinence or saddle anesthesias.  Patient takes several medications for high blood pressure.  He states that he is compliant with his medications.  He has not seen primary care since March.  He had an appointment in March but that was canceled due to COVID-19 and has not been rescheduled.  Patient states that he has noticed that he can occasionally hear himself wheezing with exercise. No CP or SOB.  He smokes cigarettes.  No headaches, chest pain, abdominal pain, leg pain.   Past Medical History:  Diagnosis Date  . DVT (deep venous thrombosis) (HCC)   . Hypertension   . Obesity     Patient Active Problem List   Diagnosis Date Noted  . 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 Prothrombin gene mutation (HCC) 07/11/2016  . Tobacco use disorder 07/01/2016  . 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 Accelerated hypertension 06/13/2016  . Chest pain 06/13/2016  . Left arm swelling 06/13/2016    Past Surgical History:  Procedure Laterality Date  . NO PAST SURGERIES      Prior to Admission medications   Medication Sig Start Date End Date Taking? Authorizing Provider  acetaminophen (TYLENOL) 500 MG tablet Take 1 tablet  (500 mg total) by mouth every 6 (six) hours as needed. 08/07/19   Enid Derry, PA-C  albuterol (VENTOLIN HFA) 108 (90 Base) MCG/ACT inhaler Inhale 2-4 puffs by mouth every 4 hours as needed for wheezing, cough, and/or shortness of breath 04/17/19   Loleta Rose, MD  aspirin EC 325 MG tablet Take 1 tablet (325 mg total) by mouth every 6 (six) hours as needed for mild pain or moderate pain. 11/18/18   Sharman Cheek, MD  benzonatate (TESSALON PERLES) 100 MG capsule Take 1-2 tabs TID prn cough Patient not taking: Reported on 11/18/2018 05/11/18   Menshew, Charlesetta Ivory, PA-C  buPROPion (WELLBUTRIN) 100 MG tablet Take 100 mg by mouth 2 (two) times a day.    [provider]  carvedilol (COREG) 12.5 MG tablet Take 1 tablet (12.5 mg total) by mouth 2 (two) times daily with a meal. 06/16/16   Shaune Pollack, MD  hydrochlorothiazide (HYDRODIURIL) 25 MG tablet Take 1 tablet (25 mg total) by mouth daily. 07/18/18   Sharyn Creamer, MD  HYDROcodone-homatropine (HYCODAN) 5-1.5 MG/5ML syrup Take 5 mLs by mouth every 6 (six) hours as needed for cough. 04/17/19   Loleta Rose, MD  lidocaine (LIDODERM) 5 % Place 1 patch onto the skin daily. Remove & Discard patch within 12 hours or as directed by MD 08/07/19   Enid Derry, PA-C  losartan (COZAAR) 25 MG tablet Take 25 mg by mouth at bedtime.    [provider]  methocarbamol (ROBAXIN) 500  MG tablet Take 1 tablet (500 mg total) by mouth 2 (two) times daily. Patient not taking: Reported on 11/18/2018 09/08/18   Nuala Alpha A, PA-C  predniSONE (DELTASONE) 10 MG tablet Take 5 tablets (50 mg total) by mouth daily. Patient not taking: Reported on 11/18/2018 08/01/18   Sherrie George B, FNP  traMADol (ULTRAM) 50 MG tablet Take 1 tablet (50 mg total) by mouth every 6 (six) hours as needed. 08/07/19 08/06/20  Laban Emperor, PA-C    Allergies Patient has no known allergies.  Family History  Problem Relation Age of Onset  . Hypertension Other   . Diabetes  Maternal Grandmother     Social History Social History   Tobacco Use  . Smoking status: Current Every Day Smoker    Packs/day: 0.50    Types: Cigarettes  . Smokeless tobacco: Never Used  Substance Use Topics  . Alcohol use: Yes  . Drug use: No     Review of Systems  Cardiovascular: No chest pain. Respiratory: No cough. No SOB. Gastrointestinal: No abdominal pain.  No nausea, no vomiting.  Genitourinary: Negative for dysuria. Musculoskeletal: Positive for back pain. Skin: Negative for rash, abrasions, lacerations, ecchymosis. Neurological: Negative for headaches, numbness or tingling   ____________________________________________   PHYSICAL EXAM:  VITAL SIGNS: ED Triage Vitals [08/07/19 1955]  Enc Vitals Group     BP (!) 197/123     Pulse Rate (!) 101     Resp 20     Temp 99.1 F (37.3 C)     Temp Source Oral     SpO2 91 %     Weight 290 lb (131.5 kg)     Height 5\' 10"  (1.778 m)     Head Circumference      Peak Flow      Pain Score 8     Pain Loc      Pain Edu?      Excl. in [place]?      Constitutional: Alert and oriented. Well appearing and in no acute distress. Eyes: Conjunctivae are normal. PERRL. EOMI. Head: Atraumatic. ENT:      Ears:      Nose: No congestion/rhinnorhea.      Mouth/Throat: Mucous membranes are moist.  Neck: No stridor.   Cardiovascular: Normal rate, regular rhythm.  Good peripheral circulation. Respiratory: Normal respiratory effort without tachypnea or retractions. Lungs CTAB. Good air entry to the bases with no decreased or absent breath sounds. Gastrointestinal: Bowel sounds 4 quadrants. Soft and nontender to palpation. No guarding or rigidity. No palpable masses. No distention. No CVA tenderness. Musculoskeletal: Full range of motion to all extremities. No gross deformities appreciated.  Diffuse tenderness to palpation to lumbar spine.  No tenderness to palpation to paralumbar spinal muscles.  Strength equal in lower extremities  bilaterally.  Normal gait. Neurologic:  Normal speech and language. No gross focal neurologic deficits are appreciated.  Skin:  Skin is warm, dry and intact. No rash noted. Psychiatric: Mood and affect are normal. Speech and behavior are normal. Patient exhibits appropriate insight and judgement.   ____________________________________________   LABS (all labs ordered are listed, but only abnormal results are displayed)  Labs Reviewed  URINALYSIS, COMPLETE (UACMP) WITH MICROSCOPIC - Abnormal; Notable for the following components:      Result Value   Color, Urine YELLOW (*)    APPearance HAZY (*)    Protein, ur 30 (*)    Leukocytes,Ua TRACE (*)    All other components within normal limits  GC/CHLAMYDIA  PROBE AMP   ____________________________________________  EKG  NSR ____________________________________________  RADIOLOGY Lexine Baton, personally viewed and evaluated these images (plain radiographs) as part of my medical decision making, as well as reviewing the written report by the radiologist.  DG Chest 2 View  Result Date: 08/07/2019 CLINICAL DATA:  Shortness of breath, wheezing EXAM: CHEST - 2 VIEW COMPARISON:  04/16/2019 FINDINGS: Mild peribronchial thickening, similar prior study. Heart is normal size. No confluent airspace opacities or effusions. No acute bony abnormality. IMPRESSION: Mild chronic bronchitic changes. Electronically Signed   By: Charlett Nose M.D.   On: 08/07/2019 21:11   DG Lumbar Spine 2-3 Views  Result Date: 08/07/2019 CLINICAL DATA:  Back pain EXAM: LUMBAR SPINE - 2-3 VIEW COMPARISON:  09/08/2018 FINDINGS: There is no evidence of lumbar spine fracture. Alignment is normal. Intervertebral disc spaces are maintained. IMPRESSION: Negative. Electronically Signed   By: Charlett Nose M.D.   On: 08/07/2019 21:10    ____________________________________________    PROCEDURES  Procedure(s) performed:    Procedures    Medications  acetaminophen  (TYLENOL) tablet 650 mg (has no administration in time range)     ____________________________________________   INITIAL IMPRESSION / ASSESSMENT AND PLAN / ED COURSE  Pertinent labs & imaging results that were available during my care of the patient were reviewed by me and considered in my medical decision making (see chart for details).  Review of the [place] CSRS was performed in accordance of the [facility] prior to dispensing any controlled drugs.   Patient presented to the emergency department for evaluation of low back pain for 1 month.  Vital signs and exam are reassuring.  Lumbar x-ray negative for acute bony abnormalities.  Urinalysis has white blood cells and leukocytes.  Urine will be sent for culture.  Gonorrhea and Chlamydia tests are pending.  Patient denies any penile discharge or known exposure to gonorrhea chlamydia.  Patient's blood pressure was elevated in the emergency department.  He states that he is compliant with his blood pressure medications.  He states this is what his blood pressure has been running.  He denies any headache, shortness of breath, chest pain.  Chest x-ray was ordered to further evaluate occasional wheezing with exercise.  Chest x-ray consistent with chronic bronchitic changes.  EKG shows normal sinus rhythm and is consistent with his previous EKG of October 2020.  Patient will be discharged home with prescriptions for Tylenol and a short course of tramadol. Patient is to follow up with PCP as directed.  Patient is agreeable to call primary care tomorrow for 8 appointment this week.  Patient is given ED precautions to return to the ED for any worsening or new symptoms.   Ricky Leonard was evaluated in Emergency Department on 08/07/2019 for the symptoms described in the history of present illness. He was evaluated in the context of the global COVID-19 pandemic, which necessitated consideration that the patient might be at risk for infection with the SARS-CoV-2 virus  that causes COVID-19. Institutional protocols and algorithms that pertain to the evaluation of patients at risk for COVID-19 are in a state of rapid change based on information released by regulatory bodies including the CDC and federal and state organizations. These policies and algorithms were followed during the patient's care in the ED.  ____________________________________________  FINAL CLINICAL IMPRESSION(S) / ED DIAGNOSES  Final diagnoses:  Acute midline low back pain without sciatica  Elevated blood pressure reading      NEW MEDICATIONS STARTED DURING THIS VISIT:  ED Discharge Orders         Ordered    traMADol (ULTRAM) 50 MG tablet  Every 6 hours PRN     08/07/19 2237    acetaminophen (TYLENOL) 500 MG tablet  Every 6 hours PRN     08/07/19 2238    lidocaine (LIDODERM) 5 %  Every 24 hours     08/07/19 2238              This chart was dictated using voice recognition software/Dragon. Despite best efforts to proofread, errors can occur which can change the meaning. Any change was purely unintentional.    Enid Derry, PA-C 08/07/19 2334    Miguel Aschoff., MD 08/07/19 [phone]

## 2019-08-07 NOTE — ED Triage Notes (Signed)
Patient now states he has urinary frequency. States he does have high blood pressure and is taking his medicine as prescribed. Today's blood pressure "is what it normally runs."

## 2019-08-07 NOTE — Discharge Instructions (Signed)
Please take Tylenol for pain.  You can also use Lidoderm patch.  You can take tramadol for extreme pain.  Your blood pressure was elevated in the emergency department.  Please continue to take your blood pressure medication.  Return to the emergency department for any headache, shortness breath, chest pain.  Please call primary care in the morning for a follow-up appointment this week.

## 2019-08-09 ENCOUNTER — Other Ambulatory Visit: Payer: Self-pay

## 2019-08-09 ENCOUNTER — Encounter: Payer: Self-pay | Admitting: Emergency Medicine

## 2019-08-09 DIAGNOSIS — I10 Essential (primary) hypertension: Secondary | ICD-10-CM | POA: Insufficient documentation

## 2019-08-09 DIAGNOSIS — M545 Low back pain: Secondary | ICD-10-CM | POA: Diagnosis present

## 2019-08-09 DIAGNOSIS — F17210 Nicotine dependence, cigarettes, uncomplicated: Secondary | ICD-10-CM | POA: Insufficient documentation

## 2019-08-09 DIAGNOSIS — Z7982 Long term (current) use of aspirin: Secondary | ICD-10-CM | POA: Insufficient documentation

## 2019-08-09 DIAGNOSIS — Z79899 Other long term (current) drug therapy: Secondary | ICD-10-CM | POA: Insufficient documentation

## 2019-08-09 DIAGNOSIS — M5126 Other intervertebral disc displacement, lumbar region: Secondary | ICD-10-CM | POA: Diagnosis not present

## 2019-08-09 NOTE — ED Triage Notes (Signed)
Pt to ED from home c/o back pain x1 month, states urinary frequency.  Denies n/v/d, denies injury to back.

## 2019-08-10 ENCOUNTER — Emergency Department: Payer: Medicaid Other

## 2019-08-10 ENCOUNTER — Emergency Department
Admission: EM | Admit: 2019-08-10 | Discharge: 2019-08-10 | Disposition: A | Discharge status: Home / Self Care | Payer: Medicaid Other | Attending: Emergency Medicine | Admitting: Emergency Medicine

## 2019-08-10 DIAGNOSIS — M5136 Other intervertebral disc degeneration, lumbar region: Secondary | ICD-10-CM

## 2019-08-10 DIAGNOSIS — M5126 Other intervertebral disc displacement, lumbar region: Secondary | ICD-10-CM

## 2019-08-10 LAB — URINALYSIS, COMPLETE (UACMP) WITH MICROSCOPIC
Bacteria, UA: NONE SEEN
Bilirubin Urine: NEGATIVE
Glucose, UA: NEGATIVE mg/dL
Hgb urine dipstick: NEGATIVE
Ketones, ur: NEGATIVE mg/dL
Leukocytes,Ua: NEGATIVE
Nitrite: NEGATIVE
Protein, ur: 100 mg/dL — AB
Specific Gravity, Urine: 1.033 — ABNORMAL HIGH (ref 1.005–1.030)
pH: 5 (ref 5.0–8.0)

## 2019-08-10 LAB — GC/CHLAMYDIA PROBE AMP
Chlamydia trachomatis, NAA: NEGATIVE
Neisseria Gonorrhoeae by PCR: NEGATIVE

## 2019-08-10 MED ORDER — OXYCODONE HCL 5 MG PO TABS
5.0000 mg | ORAL_TABLET | Freq: Once | ORAL | Status: AC
  Administered 2019-08-10: 5 mg via ORAL
  Filled 2019-08-10: qty 1

## 2019-08-10 MED ORDER — OXYCODONE HCL 5 MG PO TABS: 5.0000 mg | ORAL_TABLET | Freq: Three times a day (TID) | ORAL | 0 refills | Status: AC | PRN

## 2019-08-10 MED ORDER — ACETAMINOPHEN 500 MG PO TABS
1000.0000 mg | ORAL_TABLET | Freq: Once | ORAL | Status: AC
  Administered 2019-08-10: 1000 mg via ORAL
  Filled 2019-08-10: qty 2

## 2019-08-10 NOTE — ED Notes (Signed)
Post void bladder scan volume: 0 mL

## 2019-08-10 NOTE — ED Provider Notes (Signed)
[facility] Emergency Department Provider Note  ____________________________________________  Time seen: Approximately 2:14 AM  I have reviewed the triage vital signs and the nursing notes.   HISTORY  Chief Complaint Back Pain   HPI Ricky Leonard is a 39 y.o. male with a history of obesity, hypertension, prothrombin gene mutation who presents for evaluation of low back pain.  Patient has had pain for about a month.  He reports that the pain started a month ago while at work.  He was picking up and lifting very heavy bags at work when the pain started.  The pain is sharp, located diffusely across his lower back, constant and nonradiating.  He was seen here 3 days ago for the same and prescribed tramadol and lidocaine patches.  Is also been taking Tylenol with no significant relief.  He denies urinary or bowel incontinence or retention, saddle anesthesia, lower extremity weakness or numbness.  No abdominal pain.  The pain is worse when he moves his torso.  Usually better when he finds a comfortable position laying down.  He denies any night sweats, unintentional weight loss, history of cancer or IV drug use.  No fever or chills.  No chest pain or shortness of breath.   Past Medical History:  Diagnosis Date  . DVT (deep venous thrombosis) (HCC)   . Hypertension   . Obesity     Patient Active Problem List   Diagnosis Date Noted  . 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 Prothrombin gene mutation (HCC) 07/11/2016  . Tobacco use disorder 07/01/2016  . 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 Accelerated hypertension 06/13/2016  . Chest pain 06/13/2016  . Left arm swelling 06/13/2016    Past Surgical History:  Procedure Laterality Date  . NO PAST SURGERIES      Prior to Admission medications   Medication Sig Start Date End Date Taking? Authorizing Provider  acetaminophen (TYLENOL) 500 MG tablet Take 1 tablet (500 mg total) by mouth every 6  (six) hours as needed. 08/07/19   Enid Derry, PA-C  albuterol (VENTOLIN HFA) 108 (90 Base) MCG/ACT inhaler Inhale 2-4 puffs by mouth every 4 hours as needed for wheezing, cough, and/or shortness of breath 04/17/19   Loleta Rose, MD  aspirin EC 325 MG tablet Take 1 tablet (325 mg total) by mouth every 6 (six) hours as needed for mild pain or moderate pain. 11/18/18   Sharman Cheek, MD  benzonatate (TESSALON PERLES) 100 MG capsule Take 1-2 tabs TID prn cough Patient not taking: Reported on 11/18/2018 05/11/18   Menshew, Charlesetta Ivory, PA-C  buPROPion (WELLBUTRIN) 100 MG tablet Take 100 mg by mouth 2 (two) times a day.    [provider]  carvedilol (COREG) 12.5 MG tablet Take 1 tablet (12.5 mg total) by mouth 2 (two) times daily with a meal. 06/16/16   Shaune Pollack, MD  hydrochlorothiazide (HYDRODIURIL) 25 MG tablet Take 1 tablet (25 mg total) by mouth daily. 07/18/18   Sharyn Creamer, MD  HYDROcodone-homatropine (HYCODAN) 5-1.5 MG/5ML syrup Take 5 mLs by mouth every 6 (six) hours as needed for cough. 04/17/19   Loleta Rose, MD  lidocaine (LIDODERM) 5 % Place 1 patch onto the skin daily. Remove & Discard patch within 12 hours or as directed by MD 08/07/19   Enid Derry, PA-C  losartan (COZAAR) 25 MG tablet Take 25 mg by mouth at bedtime.    [provider]  methocarbamol (ROBAXIN) 500 MG tablet Take 1 tablet (  500 mg total) by mouth 2 (two) times daily. Patient not taking: Reported on 11/18/2018 09/08/18   Harlene Salts A, PA-C  oxyCODONE (ROXICODONE) 5 MG immediate release tablet Take 1 tablet (5 mg total) by mouth every 8 (eight) hours as needed. 08/10/19 08/09/20  Nita Sickle, MD  predniSONE (DELTASONE) 10 MG tablet Take 5 tablets (50 mg total) by mouth daily. Patient not taking: Reported on 11/18/2018 08/01/18   Kem Boroughs B, FNP  traMADol (ULTRAM) 50 MG tablet Take 1 tablet (50 mg total) by mouth every 6 (six) hours as needed. 08/07/19 08/06/20  Enid Derry, PA-C     Allergies Patient has no known allergies.  Family History  Problem Relation Age of Onset  . Hypertension Other   . Diabetes Maternal Grandmother     Social History Social History   Tobacco Use  . Smoking status: Current Every Day Smoker    Packs/day: 0.50    Types: Cigarettes  . Smokeless tobacco: Never Used  Substance Use Topics  . Alcohol use: Yes  . Drug use: No    Review of Systems  Constitutional: Negative for fever. Eyes: Negative for visual changes. ENT: Negative for sore throat. Neck: No neck pain  Cardiovascular: Negative for chest pain. Respiratory: Negative for shortness of breath. Gastrointestinal: Negative for abdominal pain, vomiting or diarrhea. Genitourinary: Negative for dysuria. Musculoskeletal: +back pain. Skin: Negative for rash. Neurological: Negative for headaches, weakness or numbness. Psych: No SI or HI  ____________________________________________   PHYSICAL EXAM:  VITAL SIGNS: ED Triage Vitals  Enc Vitals Group     BP 08/09/19 2250 (!) 152/109     Pulse Rate 08/09/19 2250 85     Resp 08/09/19 2250 16     Temp 08/09/19 2250 98.7 F (37.1 C)     Temp Source 08/09/19 2250 Oral     SpO2 08/09/19 2250 94 %     Weight 08/09/19 2252 290 lb (131.5 kg)     Height 08/09/19 2252 5\' 10"  (1.778 m)     Head Circumference --      Peak Flow --      Pain Score 08/09/19 2251 10     Pain Loc --      Pain Edu? --      Excl. in [place]? --     Constitutional: Alert and oriented. Well appearing and in no apparent distress. HEENT:      Head: Normocephalic and atraumatic.         Eyes: Conjunctivae are normal. Sclera is non-icteric.       Mouth/Throat: Mucous membranes are moist.       Neck: Supple with no signs of meningismus. Cardiovascular: Regular rate and rhythm.  Respiratory: Normal respiratory effort. Lungs are clear to auscultation bilaterally. No wheezes, crackles, or rhonchi.  Gastrointestinal: Soft, non tender, and non distended with  positive bowel sounds. No rebound or guarding. Musculoskeletal: No CT and L-spine tenderness.  Bilateral lumbar paraspinal tenderness. Neurologic: Normal speech and language. Face is symmetric. Moving all extremities. Normal gait although patient looks uncomfortable moving and standing from seating position due to pain. Normal strength and sensation of b/l LE. 1+ patella DTRs bilaterally Skin: Skin is warm, dry and intact. No rash noted. Psychiatric: Mood and affect are normal. Speech and behavior are normal.  ____________________________________________   LABS (all labs ordered are listed, but only abnormal results are displayed)  Labs Reviewed  URINALYSIS, COMPLETE (UACMP) WITH MICROSCOPIC - Abnormal; Notable for the following components:  Result Value   Color, Urine YELLOW (*)    APPearance HAZY (*)    Specific Gravity, Urine 1.033 (*)    Protein, ur 100 (*)    All other components within normal limits   ____________________________________________  EKG  none  ____________________________________________  RADIOLOGY  I have personally reviewed the images performed during this visit and I agree with the Radiologist's read.   Interpretation by Radiologist:  CT Lumbar Spine Wo Contrast  Result Date: 08/10/2019 CLINICAL DATA:  Back pain for 1 month, urinary frequency, no known injury, lower back pain, nonradiating EXAM: CT LUMBAR SPINE WITHOUT CONTRAST TECHNIQUE: Multidetector CT imaging of the lumbar spine was performed without intravenous contrast administration. Multiplanar CT image reconstructions were also generated. COMPARISON:  Lumbar radiographs 08/07/2019, 09/08/2018 FINDINGS: Segmentation: 5 normally formed lumbar type vertebral bodies are seen. Alignment: Preservation of the normal lumbar lordosis. No spondylolysis or spondylolisthesis. Minimal facet degenerative changes L4-5 on the right. Vertebrae: No acute fracture or vertebral body height loss. No suspicious  osseous lesions. Posterior elements and transverse processes are intact. Paraspinal and other soft tissues: No paravertebral fluid or swelling. No visible canal hematoma. Included portions of posterior abdomen demonstrate some aortic atherosclerosis without visible acute abnormality. Disc levels: There is a large central disc protrusion at L3-4 with indentation of the ventral thecal sac and resulting moderate to severe canal stenosis. No other significant posterior disc abnormality, spinal canal stenosis or neural foraminal narrowing in the lumbar spine. IMPRESSION: 1. No acute fracture or vertebral body height loss. 2. 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 May certainly present as a pain generator for this patient. Electronically Signed   By: Kreg Shropshire M.D.   On: 08/10/2019 02:59      ____________________________________________   PROCEDURES  Procedure(s) performed: None Procedures Critical Care performed:  None ____________________________________________   INITIAL IMPRESSION / ASSESSMENT AND PLAN / ED COURSE  39 y.o. male with a history of obesity, hypertension, prothrombin gene mutation who presents for evaluation of low back pain x 1 month that started after patient was lifting several heavy bags at work. Patient looks uncomfortable when he moves and tries to stand up due to pain, has his back stiff while doing so. Inspection of the back with no abnormalities. Palpation of the back illicit no midline tenderness but he has diffuse bilateral paraspinal tenderness. Normal strength and sensation of b/l LE, normal DTRs. Rectal tone normal, post void residual volume of 0cc. Extremities warm and well perfused with normal distal pulses, abdomen is obese and non tender.  Ddx lumbar strain, muscle spasms, compression fracture, disc disease, arthritis. No signs or symptoms of saddle anesthesia. XR done 3 days  ago negative.     _________________________ 3:34 AM on 08/10/2019 ----------------------------------------- CT showing a large central disc protrusion at L3 and L4 with moderate to severe canal stenosis.  Patient is completely neurologically intact with normal reflexes, normal strength, normal sensation, normal rectal tone, and post void residual volume is 0 cc.  Therefore recommended outpatient follow-up with neurosurgery.  I have personally emailed Dr. Adriana Simas to allow for expedited outpatient follow-up.  Discussed strict return precautions for any signs of spinal cord involvement with the patient.  Will provide him with a course of oxycodone for pain.     As part of my medical decision making, I reviewed the following data within the electronic MEDICAL RECORD NUMBER Nursing notes reviewed and incorporated, Labs reviewed ,  Old chart reviewed, Radiograph reviewed , Notes from prior ED visits and [place] Controlled Substance Database   Please note:  Patient was evaluated in Emergency Department today for the symptoms described in the history of present illness. Patient was evaluated in the context of the global COVID-19 pandemic, which necessitated consideration that the patient might be at risk for infection with the SARS-CoV-2 virus that causes COVID-19. Institutional protocols and algorithms that pertain to the evaluation of patients at risk for COVID-19 are in a state of rapid change based on information released by regulatory bodies including the CDC and federal and state organizations. These policies and algorithms were followed during the patient's care in the ED.  Some ED evaluations and interventions may be delayed as a result of limited staffing during the pandemic.   ____________________________________________   FINAL CLINICAL IMPRESSION(S) / ED DIAGNOSES   Final diagnoses:  Bulging lumbar disc      NEW MEDICATIONS STARTED DURING THIS VISIT:  ED Discharge Orders         Ordered     oxyCODONE (ROXICODONE) 5 MG immediate release tablet  Every 8 hours PRN     08/10/19 0333           Note:  This document was prepared using Dragon voice recognition software and may include unintentional dictation errors.    Alfred Levins, [place], MD 08/10/19 [phone]

## 2019-08-10 NOTE — ED Notes (Signed)
Patient sitting in chair, NAD

## 2019-08-10 NOTE — Discharge Instructions (Addendum)
Call Dr. Patsey Berthold office this morning for an appointment as soon as possible. Return to the ER immediately for weakness or numbness of your legs, numbness in your groin area, difficulty urinating or having a bowel movement, incontinence of urine or stool.   Pain control: Take tylenol 1000mg  every 8 hours. Take 5mg  of oxycodone every 6 hours for breakthrough pain. If you need the oxycodone make sure to take one senokot as well to prevent constipation.  Do not drink alcohol, drive or participate in any other potentially dangerous activities while taking this medication as it may make you sleepy. Do not take this medication with any other sedating medications, either prescription or over-the-counter.

## 2019-09-15 ENCOUNTER — Ambulatory Visit: Payer: Medicaid Other | Attending: Internal Medicine

## 2019-09-15 DIAGNOSIS — Z20822 Contact with and (suspected) exposure to covid-19: Secondary | ICD-10-CM

## 2019-09-16 LAB — NOVEL CORONAVIRUS, NAA: SARS-CoV-2, NAA: DETECTED — AB

## 2019-09-17 ENCOUNTER — Other Ambulatory Visit: Payer: Self-pay | Admitting: Adult Health

## 2019-09-17 MED ORDER — SODIUM CHLORIDE 0.9 % IV SOLN
700.0000 mg | Freq: Once | INTRAVENOUS | Status: AC
  Administered 2019-09-18: 10:00:00 700 mg via INTRAVENOUS
  Filled 2019-09-17: qty 700

## 2019-09-17 NOTE — Progress Notes (Signed)
  I connected by phone with Ricky Leonard on 09/17/2019 at 9:26 AM to discuss the potential use of an new treatment for mild to moderate COVID-19 viral infection in non-hospitalized patients.  This patient is a 39 y.o. male that meets the FDA criteria for Emergency Use Authorization of bamlanivimab or casirivimab\imdevimab.  Has a (+) direct SARS-CoV-2 viral test result  Has mild or moderate COVID-19   Is ? 40 years of age and weighs ? 40 kg  Is NOT hospitalized due to COVID-19  Is NOT requiring oxygen therapy or requiring an increase in baseline oxygen flow rate due to COVID-19  Is within 10 days of symptom onset  Has at least one of the high risk factor(s) for progression to severe COVID-19 and/or hospitalization as defined in EUA.  Specific high risk criteria : BMI >/= 35   I have spoken and communicated the following to the patient or parent/caregiver:  1. FDA has authorized the emergency use of bamlanivimab and casirivimab\imdevimab for the treatment of mild to moderate COVID-19 in adults and pediatric patients with positive results of direct SARS-CoV-2 viral testing who are 56 years of age and older weighing at least 40 kg, and who are at high risk for progressing to severe COVID-19 and/or hospitalization.  2. The significant known and potential risks and benefits of bamlanivimab and casirivimab\imdevimab, and the extent to which such potential risks and benefits are unknown.  3. Information on available alternative treatments and the risks and benefits of those alternatives, including clinical trials.  4. Patients treated with bamlanivimab and casirivimab\imdevimab should continue to self-isolate and use infection control measures (e.g., wear mask, isolate, social distance, avoid sharing personal items, clean and disinfect "high touch" surfaces, and frequent handwashing) according to CDC guidelines.   5. The patient or parent/caregiver has the option to accept or refuse  bamlanivimab or casirivimab\imdevimab .  After reviewing this information with the patient, The patient agreed to proceed with receiving the bamlanimivab infusion and will be provided a copy of the Fact sheet prior to receiving the infusion.  Scheduled for 09/18/19 at 1030  . [name] 09/17/2019 9:26 AM

## 2019-09-18 ENCOUNTER — Ambulatory Visit (HOSPITAL_COMMUNITY)
Admission: RE | Admit: 2019-09-18 | Discharge: 2019-09-18 | Disposition: A | Discharge status: Home / Self Care | Payer: Medicaid Other | Source: Ambulatory Visit | Attending: Pulmonary Disease | Admitting: Pulmonary Disease

## 2019-09-18 DIAGNOSIS — U071 COVID-19: Secondary | ICD-10-CM | POA: Diagnosis not present

## 2019-09-18 MED ORDER — DIPHENHYDRAMINE HCL 50 MG/ML IJ SOLN: 50.0000 mg | Freq: Once | INTRAMUSCULAR | Status: DC | PRN

## 2019-09-18 MED ORDER — EPINEPHRINE 0.3 MG/0.3ML IJ SOAJ: 0.3000 mg | Freq: Once | INTRAMUSCULAR | Status: DC | PRN

## 2019-09-18 MED ORDER — METHYLPREDNISOLONE SODIUM SUCC 125 MG IJ SOLR: 125.0000 mg | Freq: Once | INTRAMUSCULAR | Status: DC | PRN

## 2019-09-18 MED ORDER — SODIUM CHLORIDE 0.9 % IV SOLN: INTRAVENOUS | Status: DC | PRN

## 2019-09-18 MED ORDER — ALBUTEROL SULFATE HFA 108 (90 BASE) MCG/ACT IN AERS: 2.0000 | INHALATION_SPRAY | Freq: Once | RESPIRATORY_TRACT | Status: DC | PRN

## 2019-09-18 MED ORDER — FAMOTIDINE IN NACL 20-0.9 MG/50ML-% IV SOLN: 20.0000 mg | Freq: Once | INTRAVENOUS | Status: DC | PRN

## 2019-09-18 NOTE — Discharge Instructions (Signed)

## 2019-09-18 NOTE — Progress Notes (Signed)
  Diagnosis: COVID-19  Physician: Dr. Wright  Procedure: [facility] Med: bamlanivimab infusion - Provided patient with bamlanimivab fact sheet for patients, parents and caregivers prior to infusion.  Complications: No immediate complications noted.  Discharge: Discharged home   Ricky Leonard 09/18/2019   

## 2019-10-22 IMAGING — CT CT ANGIO CHEST
2 of 12 series · 12 of 46 positions shown · IV contrast (APPLIED)
Comparison: Chest radiograph dated 08/01/2018 and CT dated
08/16/2015

CLINICAL DATA: 37-year-old male with shortness of breath.

EXAM:
CT ANGIOGRAPHY CHEST WITH CONTRAST
TECHNIQUE: Multidetector CT imaging of the chest was performed using the
standard protocol during bolus administration of intravenous
contrast. Multiplanar CT image reconstructions and MIPs were
obtained to evaluate the vascular anatomy.
CONTRAST:  75mL OMNIPAQUE IOHEXOL 350 MG/ML SOLN

[Series 9: thins · axial · 0.77mm/px · z∈[-711,-459]mm · 11 of 304 slices shown]
[im 26/304  lung]
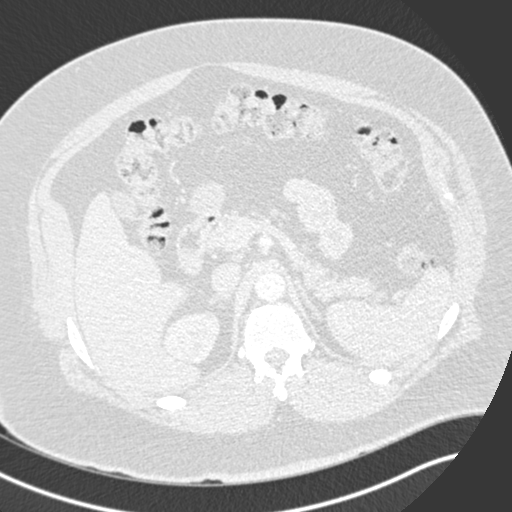
[im 51/304  soft-tissue]
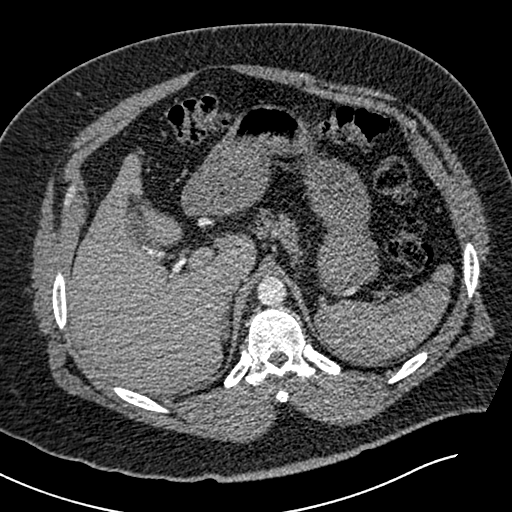
[im 76/304  lung]
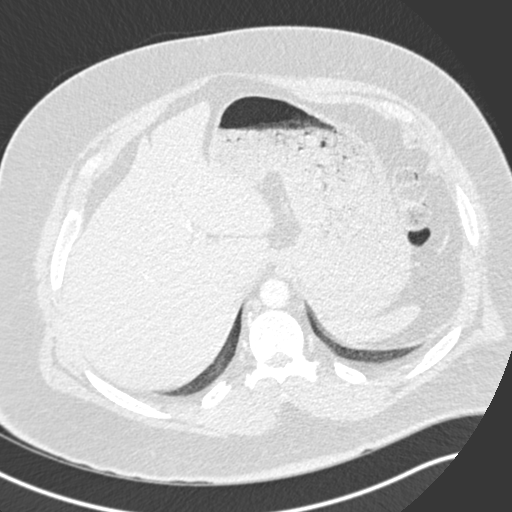
[im 102/304  soft-tissue]
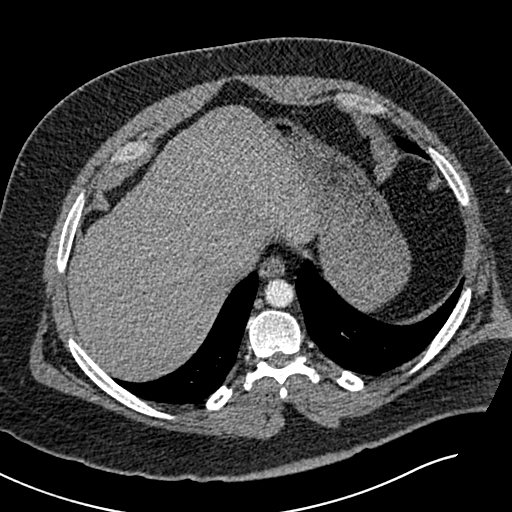
[im 127/304  lung]
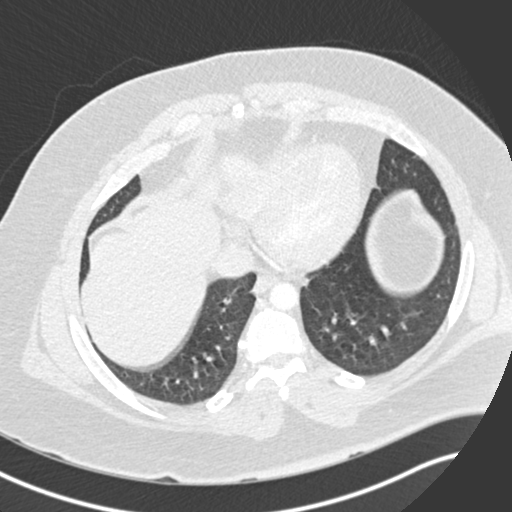
[im 152/304  soft-tissue]
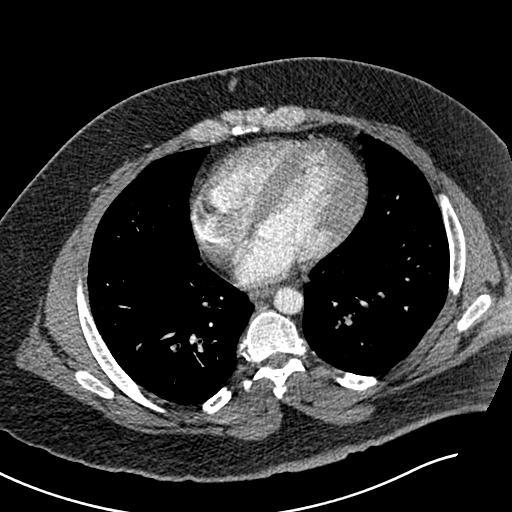
[im 177/304  lung]
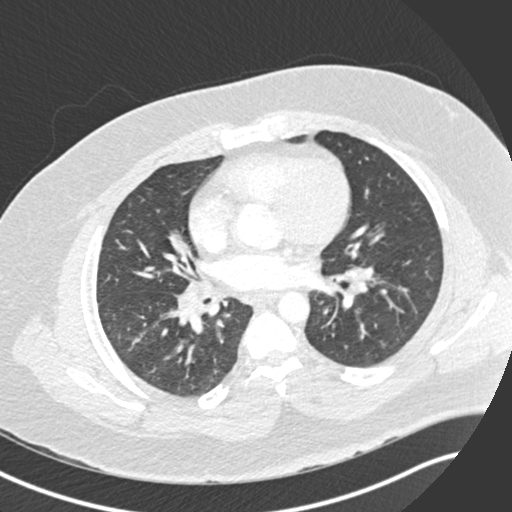
[im 203/304  soft-tissue]
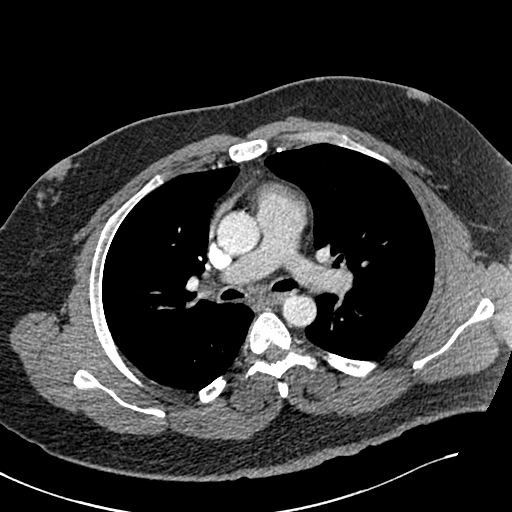
[im 228/304  lung]
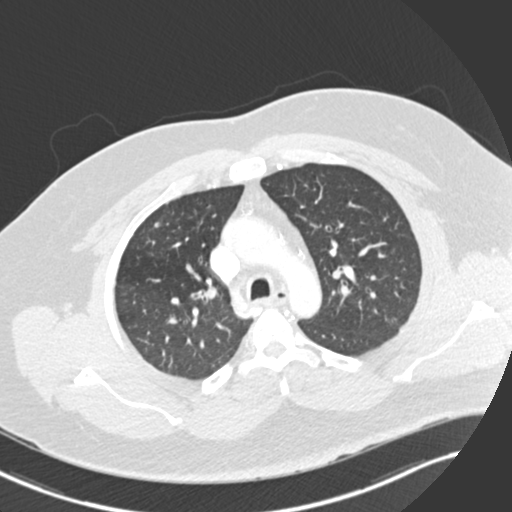
[im 253/304  soft-tissue]
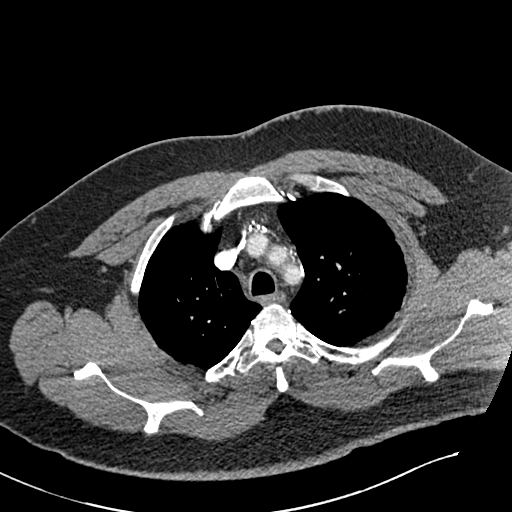
[im 278/304  lung]
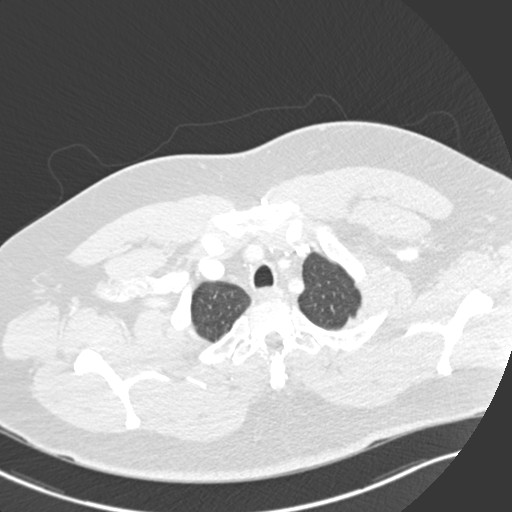

[Series 18: coronal mpr · coronal · 0.53mm/px · 1 of 109 slices shown]
[im 55/109  soft-tissue]
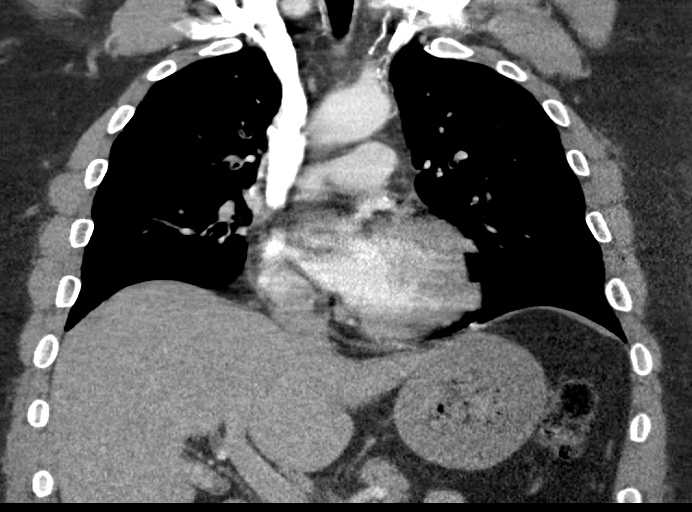

[12 of 46 positions shown; findings below may reference images not displayed]

FINDINGS: Cardiovascular: Top-normal cardiac size. There is no pericardial
effusion. The thoracic aorta is unremarkable. Evaluation of the
pulmonary arteries is very limited, due to suboptimal opacification
and timing of the contrast as well as secondary to respiratory
motion artifact. A repeat bolus injection was performed. Evaluation
is however suboptimal on the repeat CT due to the factors described
above. No large pulmonary embolus identified in the main pulmonary
trunk or central pulmonary arteries. If there is high clinical
concern for acute PE further evaluation with V/Q scan recommended.

Mediastinum/Nodes: There is no hilar or mediastinal adenopathy. The
esophagus is grossly unremarkable. No mediastinal fluid collection.

Lungs/Pleura: There is a 5 mm right upper lobe pulmonary nodule
(series 10 image 27) similar to the study of 9946. There is no focal
consolidation, pleural effusion, or pneumothorax. The central
airways are patent. There is mild thickened appearance of the
bronchial wall which may represent bronchitis. Clinical correlation
is recommended.

Upper Abdomen: Multiple small stones within the gallbladder. No
pericholecystic fluid. The visualized upper abdomen is otherwise
unremarkable.

Musculoskeletal: No chest wall abnormality. No acute or significant
osseous findings.

Review of the MIP images confirms the above findings.
IMPRESSION: 1. No large central pulmonary embolus identified. If there is high
clinical concern for acute PE further evaluation with V/Q scan
recommended.
2. Mild thickened appearance of the bronchial wall may represent
bronchitis. Clinical correlation is recommended.
3. Cholelithiasis.

## 2020-07-03 ENCOUNTER — Emergency Department
Admission: EM | Admit: 2020-07-03 | Discharge: 2020-07-03 | Disposition: A | Discharge status: Home / Self Care | Payer: Medicaid Other | Attending: Emergency Medicine | Admitting: Emergency Medicine

## 2020-07-03 ENCOUNTER — Other Ambulatory Visit: Payer: Self-pay

## 2020-07-03 DIAGNOSIS — F17210 Nicotine dependence, cigarettes, uncomplicated: Secondary | ICD-10-CM | POA: Diagnosis not present

## 2020-07-03 DIAGNOSIS — Z7982 Long term (current) use of aspirin: Secondary | ICD-10-CM | POA: Diagnosis not present

## 2020-07-03 DIAGNOSIS — I10 Essential (primary) hypertension: Secondary | ICD-10-CM | POA: Diagnosis not present

## 2020-07-03 DIAGNOSIS — Z20822 Contact with and (suspected) exposure to covid-19: Secondary | ICD-10-CM | POA: Insufficient documentation

## 2020-07-03 DIAGNOSIS — R059 Cough, unspecified: Secondary | ICD-10-CM | POA: Diagnosis present

## 2020-07-03 DIAGNOSIS — J069 Acute upper respiratory infection, unspecified: Secondary | ICD-10-CM | POA: Diagnosis not present

## 2020-07-03 DIAGNOSIS — Z79899 Other long term (current) drug therapy: Secondary | ICD-10-CM | POA: Diagnosis not present

## 2020-07-03 LAB — RESP PANEL BY RT-PCR (FLU A&B, COVID) ARPGX2
Influenza A by PCR: NEGATIVE
Influenza B by PCR: NEGATIVE
SARS Coronavirus 2 by RT PCR: NEGATIVE

## 2020-07-03 MED ORDER — IBUPROFEN 600 MG PO TABS
600.0000 mg | ORAL_TABLET | Freq: Three times a day (TID) | ORAL | 0 refills | Status: AC | PRN
Start: 2020-07-03 — End: ?

## 2020-07-03 MED ORDER — PSEUDOEPH-BROMPHEN-DM 30-2-10 MG/5ML PO SYRP
5.0000 mL | ORAL_SOLUTION | Freq: Four times a day (QID) | ORAL | 0 refills | Status: AC | PRN
Start: 2020-07-03 — End: ?

## 2020-07-03 NOTE — Discharge Instructions (Signed)
Your test was negative for COVID-19 and influenza.  Follow discharge care instruction take medication as directed.

## 2020-07-03 NOTE — ED Provider Notes (Signed)
[facility] Emergency Department Provider Note   ____________________________________________   Event Date/Time   First MD Initiated Contact with Patient 07/03/20 [phone]     (approximate)  I have reviewed the triage vital signs and the nursing notes.   HISTORY  Chief Complaint Cough (Runny nose/)    HPI Ricky Leonard is a 39 y.o. male patient complaint of nonproductive cough, runny nose, with intermitting  congestion, and fatigue.  Onset of complaint was 6 days ago.  Patient denies recent travel or known contact with COVID-19.  Patient states taking 2 doses of the Covid seen.  Patient not taken the flu shot for this season.  It was noted patient blood pressure was elevated.  Patient is not taking his medication for blood pressure this morning.  Patient takes Coreg and hydrochlorothiazide.         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 Authorizing Provider  acetaminophen (TYLENOL) 500 MG tablet Take 1 tablet (500 mg total) by mouth every 6 (six) hours as needed. 08/07/19   Enid Derry, PA-C  albuterol (VENTOLIN HFA) 108 (90 Base) MCG/ACT inhaler Inhale 2-4 puffs by mouth every 4 hours as needed for wheezing, cough, and/or shortness of breath 04/17/19   Loleta Rose, MD  aspirin EC 325 MG tablet Take 1 tablet (325 mg total) by mouth every 6 (six) hours as needed for mild pain or moderate pain. 11/18/18    Sharman Cheek, MD  benzonatate (TESSALON PERLES) 100 MG capsule Take 1-2 tabs TID prn cough Patient not taking: Reported on 11/18/2018 05/11/18   Menshew, Charlesetta Ivory, PA-C  brompheniramine-pseudoephedrine-DM 30-2-10 MG/5ML syrup Take 5 mLs by mouth 4 (four) times daily as needed. 07/03/20   Joni Reining, PA-C  buPROPion (WELLBUTRIN) 100 MG tablet Take 100 mg by mouth 2 (two) times a day.    [provider]  carvedilol (COREG) 12.5 MG tablet Take 1 tablet (12.5 mg total) by mouth 2 (two) times daily with a meal. 06/16/16   Shaune Pollack, MD  hydrochlorothiazide (HYDRODIURIL) 25 MG tablet Take 1 tablet (25 mg total) by mouth daily. 07/18/18   Sharyn Creamer, MD  HYDROcodone-homatropine (HYCODAN) 5-1.5 MG/5ML syrup Take 5 mLs by mouth every 6 (six) hours as needed for cough. 04/17/19   Loleta Rose, MD  ibuprofen (ADVIL) 600 MG tablet Take 1 tablet (600 mg total) by mouth every 8 (eight) hours as needed. 07/03/20   Joni Reining, PA-C  lidocaine (LIDODERM) 5 % Place 1 patch onto the skin daily. Remove & Discard patch within 12 hours or as directed by MD 08/07/19   Enid Derry, PA-C  losartan (COZAAR) 25 MG tablet Take 25 mg by mouth at bedtime.    [provider]  methocarbamol (ROBAXIN) 500 MG tablet Take 1 tablet (500 mg total) by mouth 2 (two) times daily. Patient not taking: Reported on 11/18/2018 09/08/18  Harlene Salts A, PA-C  oxyCODONE (ROXICODONE) 5 MG immediate release tablet Take 1 tablet (5 mg total) by mouth every 8 (eight) hours as needed. 08/10/19 08/09/20  Nita Sickle, MD  predniSONE (DELTASONE) 10 MG tablet Take 5 tablets (50 mg total) by mouth daily. Patient not taking: Reported on 11/18/2018 08/01/18   Kem Boroughs B, FNP  traMADol (ULTRAM) 50 MG tablet Take 1 tablet (50 mg total) by mouth every 6 (six) hours as needed. 08/07/19 08/06/20  Enid Derry, PA-C    Allergies Patient has no known allergies.  Family History  Problem Relation Age of Onset    Hypertension Other    Diabetes Maternal Grandmother     Social History Social History   Tobacco Use   Smoking status: Current Every Day Smoker    Packs/day: 0.50    Types: Cigarettes   Smokeless tobacco: Never Used  Substance Use Topics   Alcohol use: Yes    Comment: occ   Drug use: Yes    Types: Marijuana    Review of Systems Constitutional: No fever/chills.  Fatigue. Eyes: No visual changes. ENT: No sore throat.  Nasal congestion and runny nose Cardiovascular: Denies chest pain. Respiratory: Denies shortness of breath.  Nonproductive cough. Gastrointestinal: No abdominal pain.  No nausea, no vomiting.  No diarrhea.  No constipation. Genitourinary: Negative for dysuria. Musculoskeletal: Negative for back pain. Skin: Negative for rash. Neurological: Negative for headaches, focal weakness or numbness. Endocrine:  Hypertension   ____________________________________________   PHYSICAL EXAM:  VITAL SIGNS: ED Triage Vitals  Enc Vitals Group     BP 07/03/20 0916 (S) (!) 180/147     Pulse Rate 07/03/20 0916 70     Resp 07/03/20 0916 18     Temp 07/03/20 0916 98.2 F (36.8 C)     Temp src --      SpO2 07/03/20 0916 96 %     Weight 07/03/20 0914 285 lb (129.3 kg)     Height 07/03/20 0914 5\' 11"  (1.803 m)     Head Circumference --      Peak Flow --      Pain Score 07/03/20 0914 3     Pain Loc --      Pain Edu? --      Excl. in GC? --    Constitutional: Alert and oriented. Well appearing and in no acute distress. Eyes: Conjunctivae are normal. PERRL. EOMI. Head: Atraumatic. Nose:Clear rhinorrhea.   Mouth/Throat: Mucous membranes are moist.  Oropharynx non-erythematous.  Postnasal drainage. Neck: No stridor.  Hematological/Lymphatic/Immunilogical: No cervical lymphadenopathy. Cardiovascular: Normal rate, regular rhythm. Grossly normal heart sounds.  Good peripheral circulation. Respiratory: Normal respiratory effort.  No retractions. Lungs  CTAB. Gastrointestinal: Soft and nontender. No distention. No abdominal bruits. No CVA tenderness.   ____________________________________________   LABS (all labs ordered are listed, but only abnormal results are displayed)  Labs Reviewed  RESP PANEL BY RT-PCR (FLU A&B, COVID) [id number]   ____________________________________________  EKG   ____________________________________________  RADIOLOGY I, 07/05/20, personally viewed and evaluated these images (plain radiographs) as part of my medical decision making, as well as reviewing the written report by the radiologist.  ED MD interpretation:    Official radiology report(s): No results found.  ____________________________________________   PROCEDURES  Procedure(s) performed (including Critical Care):  Procedures   ____________________________________________   INITIAL IMPRESSION / ASSESSMENT AND PLAN / ED COURSE  As part of my medical decision making, I reviewed the following data within the electronic MEDICAL RECORD NUMBER  Patient presents with 6 days of intermittent cough, runny nose, and nasal congestion.  Patient was negative for COVID-19 and influenza.  Patient complaining physical exam consistent with viral respiratory infection with cough.  Patient given discharge care instruction advised take medication as directed.  Patient vies follow-up PCP.      ____________________________________________   FINAL CLINICAL IMPRESSION(S) / ED DIAGNOSES  Final diagnoses:  Viral URI with cough     ED Discharge Orders         Ordered    brompheniramine-pseudoephedrine-DM 30-2-10 MG/5ML syrup  4 times daily PRN        07/03/20 1158    ibuprofen (ADVIL) 600 MG tablet  Every 8 hours PRN        07/03/20 1158          *Please note:  Ricky Leonard was evaluated in Emergency Department on 07/03/2020 for the symptoms described in the history of present illness. He was evaluated in the context of the global  COVID-19 pandemic, which necessitated consideration that the patient might be at risk for infection with the SARS-CoV-2 virus that causes COVID-19. Institutional protocols and algorithms that pertain to the evaluation of patients at risk for COVID-19 are in a state of rapid change based on information released by regulatory bodies including the CDC and federal and state organizations. These policies and algorithms were followed during the patient's care in the ED.  Some ED evaluations and interventions may be delayed as a result of limited staffing during and the pandemic.*   Note:  This document was prepared using Dragon voice recognition software and may include unintentional dictation errors.    Joni Reining, PA-C 07/03/20 1201    Merwyn Katos, MD 07/03/20 [phone]

## 2020-07-03 NOTE — ED Triage Notes (Signed)
Pt comes via POV from home with c/o cough, runny nose, congestion and fatigue. Pt states this all started last Wednesday.

## 2022-07-04 ENCOUNTER — Other Ambulatory Visit: Payer: Self-pay

## 2022-07-04 ENCOUNTER — Emergency Department
Admission: EM | Admit: 2022-07-04 | Discharge: 2022-07-04 | Disposition: A | Discharge status: Home / Self Care | Payer: Medicaid Other | Attending: Emergency Medicine | Admitting: Emergency Medicine

## 2022-07-04 ENCOUNTER — Emergency Department: Payer: Medicaid Other

## 2022-07-04 DIAGNOSIS — S39012A Strain of muscle, fascia and tendon of lower back, initial encounter: Secondary | ICD-10-CM | POA: Insufficient documentation

## 2022-07-04 DIAGNOSIS — X500XXA Overexertion from strenuous movement or load, initial encounter: Secondary | ICD-10-CM | POA: Insufficient documentation

## 2022-07-04 DIAGNOSIS — I10 Essential (primary) hypertension: Secondary | ICD-10-CM | POA: Insufficient documentation

## 2022-07-04 DIAGNOSIS — M545 Low back pain, unspecified: Secondary | ICD-10-CM | POA: Diagnosis present

## 2022-07-04 MED ORDER — CYCLOBENZAPRINE HCL 5 MG PO TABS: 5.0000 mg | ORAL_TABLET | Freq: Three times a day (TID) | ORAL | 0 refills | Status: DC | PRN

## 2022-07-04 MED ORDER — KETOROLAC TROMETHAMINE 60 MG/2ML IM SOLN
60.0000 mg | Freq: Once | INTRAMUSCULAR | Status: AC
  Administered 2022-07-04: 60 mg via INTRAMUSCULAR
  Filled 2022-07-04: qty 2

## 2022-07-04 MED ORDER — HYDROCODONE-ACETAMINOPHEN 5-325 MG PO TABS: 1.0000 | ORAL_TABLET | Freq: Three times a day (TID) | ORAL | 0 refills | Status: AC | PRN

## 2022-07-04 MED ORDER — NABUMETONE 750 MG PO TABS: 750.0000 mg | ORAL_TABLET | Freq: Two times a day (BID) | ORAL | 0 refills | Status: AC

## 2022-07-04 MED ORDER — CYCLOBENZAPRINE HCL 10 MG PO TABS
10.0000 mg | ORAL_TABLET | Freq: Once | ORAL | Status: AC
  Administered 2022-07-04: 10 mg via ORAL
  Filled 2022-07-04: qty 1

## 2022-07-04 NOTE — ED Triage Notes (Signed)
Reports 2-3 weeks ago injured back lifting something then moved a certain way and felt a pop in his back.  Reports no numbness or pain to extremities but reports urinary incontinence at times.

## 2022-07-04 NOTE — ED Provider Notes (Signed)
[facility] Emergency Department Provider Note     Event Date/Time   First MD Initiated Contact with Patient 07/04/22 1352     (approximate)   History   Back Pain   HPI  Ricky Leonard is a 41 y.o. male with a history of hypertension presents to the ED for persistent low back pain.  She reports initial injury 2 to 3 weeks earlier when he was lifting something.  He reports that today he felt a pop in his back after he moved in a certain position.  Denies any frank chest pain or shortness of breath.  He also denies any distal paresthesias, foot drop, or saddle anesthesias.  He does report some intermittent urinary incontinence.  Physical Exam   Triage Vital Signs: ED Triage Vitals [07/04/22 1332]  Enc Vitals Group     BP 121/78     Pulse Rate 72     Resp 18     Temp 98.3 F (36.8 C)     Temp Source Oral     SpO2 92 %     Weight 285 lb (129.3 kg)     Height 5\' 11"  (1.803 m)     Head Circumference      Peak Flow      Pain Score 10     Pain Loc      Pain Edu?      Excl. in GC?     Most recent vital signs: Vitals:   07/04/22 1332  BP: 121/78  Pulse: 72  Resp: 18  Temp: 98.3 F (36.8 C)  SpO2: 92%    General Awake, no distress. NAD CV:  Good peripheral perfusion.  RESP:  Normal effort.  ABD:  No distention.  MSK:  Spinal alignment without midline tenderness, spasm, deformity, or step-off.  Patient transitions from sit to stand without difficulty. NEURO: Cranial nerves II to XII grossly intact.  Normal LE DTRs bilaterally.  Negative supine straight leg raise bilaterally.   ED Results / Procedures / Treatments   Labs (all labs ordered are listed, but only abnormal results are displayed) Labs Reviewed - No data to display   EKG   RADIOLOGY  I personally viewed and evaluated these images as part of my medical decision making, as well as reviewing the written report by the radiologist.  ED Provider Interpretation: no acute  findings  DG Lumbar Spine Complete  Result Date: 07/04/2022 CLINICAL DATA:  Low back pain for 3-4 weeks after a lifting injury. EXAM: LUMBAR SPINE - COMPLETE 4+ VIEW COMPARISON:  None Available. FINDINGS: There is no evidence of lumbar spine fracture. Alignment is normal. Intervertebral disc spaces are maintained. IMPRESSION: Negative. Electronically Signed   By: 07/06/2022 M.D.   On: 07/04/2022 14:50     PROCEDURES:  Critical Care performed: No  Procedures   MEDICATIONS ORDERED IN ED: Medications  ketorolac (TORADOL) injection 60 mg (60 mg Intramuscular Given 07/04/22 1439)  cyclobenzaprine (FLEXERIL) tablet 10 mg (10 mg Oral Given 07/04/22 1439)     IMPRESSION / MDM / ASSESSMENT AND PLAN / ED COURSE  I reviewed the triage vital signs and the nursing notes.                              Differential diagnosis includes, but is not limited to, strain, lumbar radiculopathy, sciatica  Patient's presentation is most consistent with acute complicated illness / injury requiring diagnostic workup.  Patient to the ED for evaluation of several weeks of intermittent low back pain.  He reports injuries preceding this strain.  Is evaluated for complaints in the ED, found to have reassuring exam without red flags.  No radiologic evidence of any acute fracture or dislocation based on my interpretation.  Patient clinical picture is most consistent with a lumbar strain. Patient will be discharged home with prescriptions for Flexeril, Relafen, and Norco. Patient is to follow up with his PCP or a local urgent care as needed or otherwise directed. Patient is given ED precautions to return to the ED for any worsening or new symptoms.     FINAL CLINICAL IMPRESSION(S) / ED DIAGNOSES   Final diagnoses:  Strain of lumbar region, initial encounter     Rx / DC Orders   ED Discharge Orders          Ordered    cyclobenzaprine (FLEXERIL) 5 MG tablet  3 times daily PRN        07/04/22 1519     nabumetone (RELAFEN) 750 MG tablet  2 times daily        07/04/22 1519    HYDROcodone-acetaminophen (NORCO) 5-325 MG tablet  3 times daily PRN        07/04/22 1519             Note:  This document was prepared using Dragon voice recognition software and may include unintentional dictation errors.    Lissa Hoard, PA-C 07/04/22 Roxan Hockey, MD 07/05/22 1510

## 2022-07-04 NOTE — ED Notes (Signed)
See triage note  Presents with back pain  states he developed pain to back after lifting  Ambulates slowly d/t pain    States he felt a pop after turning a certain way

## 2022-07-04 NOTE — Discharge Instructions (Signed)
Take the prescription meds as directed.  Follow-up with your primary provider for ongoing symptoms.  Return to the ED if necessary.

## 2022-08-18 ENCOUNTER — Other Ambulatory Visit: Payer: Self-pay

## 2022-08-18 ENCOUNTER — Emergency Department
Admission: EM | Admit: 2022-08-18 | Discharge: 2022-08-18 | Discharge status: Against Medical Advice | Payer: Medicaid Other | Attending: Emergency Medicine | Admitting: Emergency Medicine

## 2022-08-18 ENCOUNTER — Encounter: Payer: Self-pay | Admitting: Emergency Medicine

## 2022-08-18 DIAGNOSIS — Z5321 Procedure and treatment not carried out due to patient leaving prior to being seen by health care provider: Secondary | ICD-10-CM | POA: Diagnosis not present

## 2022-08-18 DIAGNOSIS — R112 Nausea with vomiting, unspecified: Secondary | ICD-10-CM | POA: Insufficient documentation

## 2022-08-18 LAB — COMPREHENSIVE METABOLIC PANEL
ALT: 16 U/L (ref 0–44)
AST: 17 U/L (ref 15–41)
Albumin: 3.5 g/dL (ref 3.5–5.0)
Alkaline Phosphatase: 61 U/L (ref 38–126)
Anion gap: 8 (ref 5–15)
BUN: 18 mg/dL (ref 6–20)
CO2: 26 mmol/L (ref 22–32)
Calcium: 8.7 mg/dL — ABNORMAL LOW (ref 8.9–10.3)
Chloride: 103 mmol/L (ref 98–111)
Creatinine, Ser: 1.05 mg/dL (ref 0.61–1.24)
GFR, Estimated: >60 mL/min (ref >60)
Glucose, Bld: 113 mg/dL — ABNORMAL HIGH (ref 70–99)
Potassium: 3.2 mmol/L — ABNORMAL LOW (ref 3.5–5.1)
Sodium: 137 mmol/L (ref 135–145)
Total Bilirubin: 0.8 mg/dL (ref 0.3–1.2)
Total Protein: 6.2 g/dL — ABNORMAL LOW (ref 6.5–8.1)

## 2022-08-18 LAB — CBC
HCT: 42.3 % (ref 39.0–52.0)
Hemoglobin: 14.3 g/dL (ref 13.0–17.0)
MCH: 30.1 pg (ref 26.0–34.0)
MCHC: 33.8 g/dL (ref 30.0–36.0)
MCV: 89.1 fL (ref 80.0–100.0)
Platelets: 230 10*3/uL (ref 150–400)
RBC: 4.75 MIL/uL (ref 4.22–5.81)
RDW: 11.9 % (ref 11.5–15.5)
WBC: 8.8 10*3/uL (ref 4.0–10.5)
nRBC: 0 % (ref 0.0–0.2)

## 2022-08-18 LAB — LIPASE, BLOOD: Lipase: 46 U/L (ref 11–51)

## 2022-08-18 MED ORDER — ONDANSETRON 4 MG PO TBDP: 4.0000 mg | ORAL_TABLET | Freq: Once | ORAL | Status: DC

## 2022-08-18 NOTE — ED Triage Notes (Signed)
Pt to ED from home c/o nausea and vomiting since 0830 this morning, states emesis was phlegm like.  States ran out of victoza for pre-diabetes for a couple weeks and started back again last night, thinks that is why he's nauseous.  Denies pain.  Pt A&Ox4, chest rise even and unlabored, skin WNL and in NAD at this time.

## 2022-08-18 NOTE — ED Notes (Signed)
No answer when called several times from lobby 

## 2022-12-23 DIAGNOSIS — Z87891 Personal history of nicotine dependence: Secondary | ICD-10-CM | POA: Diagnosis not present

## 2022-12-23 DIAGNOSIS — Z8249 Family history of ischemic heart disease and other diseases of the circulatory system: Secondary | ICD-10-CM | POA: Diagnosis not present

## 2022-12-23 DIAGNOSIS — Z8673 Personal history of transient ischemic attack (TIA), and cerebral infarction without residual deficits: Secondary | ICD-10-CM | POA: Diagnosis not present

## 2022-12-23 DIAGNOSIS — Z5986 Financial insecurity: Secondary | ICD-10-CM | POA: Diagnosis not present

## 2022-12-23 DIAGNOSIS — G8929 Other chronic pain: Secondary | ICD-10-CM | POA: Diagnosis not present

## 2022-12-23 DIAGNOSIS — E785 Hyperlipidemia, unspecified: Secondary | ICD-10-CM | POA: Diagnosis not present

## 2022-12-23 DIAGNOSIS — E119 Type 2 diabetes mellitus without complications: Secondary | ICD-10-CM | POA: Diagnosis not present

## 2022-12-23 DIAGNOSIS — Z7985 Long-term (current) use of injectable non-insulin antidiabetic drugs: Secondary | ICD-10-CM | POA: Diagnosis not present

## 2022-12-23 DIAGNOSIS — I10 Essential (primary) hypertension: Secondary | ICD-10-CM | POA: Diagnosis not present

## 2022-12-23 DIAGNOSIS — Z6841 Body Mass Index (BMI) 40.0 and over, adult: Secondary | ICD-10-CM | POA: Diagnosis not present

## 2022-12-24 DIAGNOSIS — Z0131 Encounter for examination of blood pressure with abnormal findings: Secondary | ICD-10-CM | POA: Diagnosis not present

## 2022-12-24 DIAGNOSIS — F5221 Male erectile disorder: Secondary | ICD-10-CM | POA: Diagnosis not present

## 2022-12-24 DIAGNOSIS — E785 Hyperlipidemia, unspecified: Secondary | ICD-10-CM | POA: Diagnosis not present

## 2022-12-24 DIAGNOSIS — F17213 Nicotine dependence, cigarettes, with withdrawal: Secondary | ICD-10-CM | POA: Diagnosis not present

## 2022-12-24 DIAGNOSIS — I10 Essential (primary) hypertension: Secondary | ICD-10-CM | POA: Diagnosis not present

## 2022-12-24 DIAGNOSIS — Z1389 Encounter for screening for other disorder: Secondary | ICD-10-CM | POA: Diagnosis not present

## 2022-12-24 DIAGNOSIS — R7303 Prediabetes: Secondary | ICD-10-CM | POA: Diagnosis not present

## 2022-12-24 DIAGNOSIS — N182 Chronic kidney disease, stage 2 (mild): Secondary | ICD-10-CM | POA: Diagnosis not present

## 2022-12-24 DIAGNOSIS — H3321 Serous retinal detachment, right eye: Secondary | ICD-10-CM | POA: Diagnosis not present

## 2023-01-16 DIAGNOSIS — H34232 Retinal artery branch occlusion, left eye: Secondary | ICD-10-CM | POA: Diagnosis not present

## 2023-01-16 DIAGNOSIS — H2589 Other age-related cataract: Secondary | ICD-10-CM | POA: Diagnosis not present

## 2023-02-05 DIAGNOSIS — L910 Hypertrophic scar: Secondary | ICD-10-CM | POA: Diagnosis not present

## 2023-09-25 ENCOUNTER — Other Ambulatory Visit: Payer: Self-pay

## 2023-09-25 ENCOUNTER — Emergency Department
Admission: EM | Admit: 2023-09-25 | Discharge: 2023-09-26 | Disposition: A | Discharge status: Home / Self Care | Attending: Emergency Medicine | Admitting: Emergency Medicine

## 2023-09-25 DIAGNOSIS — R1030 Lower abdominal pain, unspecified: Secondary | ICD-10-CM | POA: Insufficient documentation

## 2023-09-25 DIAGNOSIS — E876 Hypokalemia: Secondary | ICD-10-CM | POA: Diagnosis not present

## 2023-09-25 DIAGNOSIS — R944 Abnormal results of kidney function studies: Secondary | ICD-10-CM | POA: Insufficient documentation

## 2023-09-25 DIAGNOSIS — R112 Nausea with vomiting, unspecified: Secondary | ICD-10-CM | POA: Insufficient documentation

## 2023-09-25 NOTE — ED Triage Notes (Signed)
 Patient C/O N/V, abd pain this day.

## 2023-09-26 ENCOUNTER — Encounter: Payer: Self-pay | Admitting: Emergency Medicine

## 2023-09-26 ENCOUNTER — Emergency Department

## 2023-09-26 LAB — BASIC METABOLIC PANEL
Anion gap: 13 (ref 5–15)
BUN: 21 mg/dL — ABNORMAL HIGH (ref 6–20)
CO2: 30 mmol/L (ref 22–32)
Calcium: 10.2 mg/dL (ref 8.9–10.3)
Chloride: 95 mmol/L — ABNORMAL LOW (ref 98–111)
Creatinine, Ser: 1.74 mg/dL — ABNORMAL HIGH (ref 0.61–1.24)
GFR, Estimated: 49 mL/min — ABNORMAL LOW (ref >60)
Glucose, Bld: 142 mg/dL — ABNORMAL HIGH (ref 70–99)
Potassium: 2.9 mmol/L — ABNORMAL LOW (ref 3.5–5.1)
Sodium: 138 mmol/L (ref 135–145)

## 2023-09-26 LAB — HEPATIC FUNCTION PANEL
ALT: 15 U/L (ref 0–44)
AST: 17 U/L (ref 15–41)
Albumin: 4.5 g/dL (ref 3.5–5.0)
Alkaline Phosphatase: 65 U/L (ref 38–126)
Bilirubin, Direct: 0.3 mg/dL — ABNORMAL HIGH (ref 0.0–0.2)
Indirect Bilirubin: 1.2 mg/dL — ABNORMAL HIGH (ref 0.3–0.9)
Total Bilirubin: 1.5 mg/dL — ABNORMAL HIGH (ref 0.0–1.2)
Total Protein: 8 g/dL (ref 6.5–8.1)

## 2023-09-26 LAB — LIPASE, BLOOD: Lipase: 41 U/L (ref 11–51)

## 2023-09-26 LAB — CBC
HCT: 48.6 % (ref 39.0–52.0)
Hemoglobin: 16.8 g/dL (ref 13.0–17.0)
MCH: 30.6 pg (ref 26.0–34.0)
MCHC: 34.6 g/dL (ref 30.0–36.0)
MCV: 88.5 fL (ref 80.0–100.0)
Platelets: 302 10*3/uL (ref 150–400)
RBC: 5.49 MIL/uL (ref 4.22–5.81)
RDW: 11.9 % (ref 11.5–15.5)
WBC: 6.4 10*3/uL (ref 4.0–10.5)
nRBC: 0 % (ref 0.0–0.2)

## 2023-09-26 MED ORDER — IOHEXOL 350 MG/ML SOLN
100.0000 mL | Freq: Once | INTRAVENOUS | Status: AC | PRN
  Administered 2023-09-26: 100 mL via INTRAVENOUS

## 2023-09-26 MED ORDER — ONDANSETRON HCL 4 MG PO TABS: 4.0000 mg | ORAL_TABLET | Freq: Four times a day (QID) | ORAL | 0 refills | Status: AC | PRN

## 2023-09-26 MED ORDER — MORPHINE SULFATE (PF) 4 MG/ML IV SOLN
4.0000 mg | Freq: Once | INTRAVENOUS | Status: AC
  Administered 2023-09-26: 4 mg via INTRAVENOUS
  Filled 2023-09-26: qty 1

## 2023-09-26 MED ORDER — ONDANSETRON HCL 4 MG/2ML IJ SOLN
4.0000 mg | Freq: Once | INTRAMUSCULAR | Status: AC
  Administered 2023-09-26: 4 mg via INTRAVENOUS
  Filled 2023-09-26: qty 2

## 2023-09-26 MED ORDER — POTASSIUM CHLORIDE 10 MEQ/100ML IV SOLN
10.0000 meq | INTRAVENOUS | Status: AC
  Administered 2023-09-26 (×2): 10 meq via INTRAVENOUS
  Filled 2023-09-26: qty 100

## 2023-09-26 MED ORDER — POTASSIUM CHLORIDE CRYS ER 20 MEQ PO TBCR
40.0000 meq | EXTENDED_RELEASE_TABLET | Freq: Once | ORAL | Status: AC
  Administered 2023-09-26: 40 meq via ORAL
  Filled 2023-09-26: qty 2

## 2023-09-26 MED ORDER — SODIUM CHLORIDE 0.9 % IV BOLUS
1000.0000 mL | Freq: Once | INTRAVENOUS | Status: AC
  Administered 2023-09-26: 1000 mL via INTRAVENOUS

## 2023-09-26 NOTE — Discharge Instructions (Addendum)
 You were seen in the Emergency Department today for evaluation of your abdominal pain and CT. Fortunately, your labs and CT scan were overall reassuring against an emergency cause for your pain.  I sent a prescription for nausea medication to your pharmacy that you can take as needed.  Call your primary care doctor for further evaluation. Return to the ER for any new or worsening symptoms including worsening pain, inability to tolerate food or liquids, or any other new or concerning symptoms.

## 2023-09-26 NOTE — ED Provider Notes (Signed)
 [facility] Provider Note    Event Date/Time   First MD Initiated Contact with Patient 09/25/23 2355     (approximate)   History   Emesis   HPI  Ricky Leonard is a 43 year old male presenting to the emergency department for evaluation of vomiting and abdominal pain.  Pain is sharp, throughout lower abdomen.  Symptoms started today.  Reports frequent episodes of nonbloody vomiting.  No fevers.  No known sick contacts.  No diarrhea, loss bowel movement was yesterday.  No history of abdominal surgery.  Denies any urinary symptoms.     Physical Exam   Triage Vital Signs: ED Triage Vitals  Encounter Vitals Group     BP 09/25/23 2357 (!) 148/92     Systolic BP Percentile --      Diastolic BP Percentile --      Pulse Rate 09/25/23 2357 97     Resp 09/25/23 2357 16     Temp 09/25/23 2357 97.8 F (36.6 C)     Temp Source 09/25/23 2357 Oral     SpO2 09/25/23 2357 95 %     Weight 09/25/23 2359 250 lb (113.4 kg)     Height 09/25/23 2359 5\' 10"  (1.778 m)     Head Circumference --      Peak Flow --      Pain Score 09/25/23 2358 4     Pain Loc --      Pain Education --      Exclude from Growth Chart --     Most recent vital signs: Vitals:   09/26/23 0030 09/26/23 0135  BP: (!) 152/103 122/72  Pulse: (!) 102 86  Resp:  14  Temp:  97.8 F (36.6 C)  SpO2:  100%     General: Awake, interactive  CV:  Regular rate, good peripheral perfusion.  Resp:  Unlabored respirations, lungs good auscultation Abd:  Nondistended, soft, tender to palpation throughout the lower abdomen, no rebound or guarding Neuro:  Symmetric facial movement, fluid speech   ED Results / Procedures / Treatments   Labs (all labs ordered are listed, but only abnormal results are displayed) Labs Reviewed  BASIC METABOLIC PANEL - Abnormal; Notable for the following components:      Result Value   Potassium 2.9 (*)    Chloride 95 (*)    Glucose, Bld 142 (*)    BUN 21 (*)     Creatinine, Ser 1.74 (*)    GFR, Estimated 49 (*)    All other components within normal limits  HEPATIC FUNCTION PANEL - Abnormal; Notable for the following components:   Total Bilirubin 1.5 (*)    Bilirubin, Direct 0.3 (*)    Indirect Bilirubin 1.2 (*)    All other components within normal limits  CBC  LIPASE, BLOOD     EKG EKG independently reviewed interpreted by myself (ER attending) demonstrates:    RADIOLOGY Imaging independently reviewed and interpreted by myself demonstrates:  CT abdomen/pelvis without acute finding.  Notes cholelithiasis without findings of cholecystitis, patient without right upper quadrant tenderness on exam suggestive of acute biliary pathology  PROCEDURES:  Critical Care performed: No  Procedures   MEDICATIONS ORDERED IN ED: Medications  potassium chloride SA (KLOR-CON M) CR tablet 40 mEq (has no administration in time range)  sodium chloride 0.9 % bolus 1,000 mL (0 mLs Intravenous Stopped 09/26/23 0211)  ondansetron (ZOFRAN) injection 4 mg (4 mg Intravenous Given 09/26/23 0025)  morphine (PF) 4 MG/ML injection  4 mg (4 mg Intravenous Given 09/26/23 0024)  iohexol (OMNIPAQUE) 350 MG/ML injection 100 mL (100 mLs Intravenous Contrast Given 09/26/23 0043)  potassium chloride 10 mEq in 100 mL IVPB (0 mEq Intravenous Stopped 09/26/23 0211)     IMPRESSION / MDM / ASSESSMENT AND PLAN / ED COURSE  I reviewed the triage vital signs and the nursing notes.  Differential diagnosis includes, but is not limited to, viral illness, colitis, diverticulitis, appendicitis, other acute intra-abdominal process  Patient's presentation is most consistent with acute presentation with potential threat to life or bodily function.  43 year old male presenting with vomiting and abdominal pain.  Stable vitals on presentation.  Labs with hypokalemia, elevated creatinine, likely related to repeated episodes of vomiting.  Slightly elevated T. bili, but no right upper quadrant  pain on exam.  CT reassuring.  Patient was treated symptomatically with IV fluids, Zofran, morphine.  Potassium was repleted IV and p.o.  Patient was able to tolerate p.o. after medication.  He is comfortable discharge home.  Strict return precautions provided.  Discharged with prescription for Zofran.  Patient discharged stable condition.      FINAL CLINICAL IMPRESSION(S) / ED DIAGNOSES   Final diagnoses:  Lower abdominal pain  Nausea and vomiting, unspecified vomiting type  Hypokalemia     Rx / DC Orders   ED Discharge Orders          Ordered    ondansetron (ZOFRAN) 4 MG tablet  Every 6 hours PRN        09/26/23 0220             Note:  This document was prepared using Dragon voice recognition software and may include unintentional dictation errors.   Trinna Post, MD 09/26/23 [phone]

## 2023-09-26 NOTE — ED Notes (Signed)
 Ginger ale given for PO Challenge.  Patient instructed to take small sips.

## 2023-10-15 ENCOUNTER — Other Ambulatory Visit: Payer: Self-pay

## 2023-10-15 ENCOUNTER — Emergency Department

## 2023-10-15 ENCOUNTER — Emergency Department
Admission: EM | Admit: 2023-10-15 | Discharge: 2023-10-15 | Disposition: A | Discharge status: Home / Self Care | Attending: Emergency Medicine | Admitting: Emergency Medicine

## 2023-10-15 ENCOUNTER — Encounter: Payer: Self-pay | Admitting: Emergency Medicine

## 2023-10-15 DIAGNOSIS — K2900 Acute gastritis without bleeding: Secondary | ICD-10-CM | POA: Insufficient documentation

## 2023-10-15 DIAGNOSIS — K8020 Calculus of gallbladder without cholecystitis without obstruction: Secondary | ICD-10-CM | POA: Diagnosis not present

## 2023-10-15 DIAGNOSIS — R112 Nausea with vomiting, unspecified: Secondary | ICD-10-CM | POA: Diagnosis present

## 2023-10-15 LAB — COMPREHENSIVE METABOLIC PANEL WITH GFR
ALT: 14 U/L (ref 0–44)
AST: 16 U/L (ref 15–41)
Albumin: 4.3 g/dL (ref 3.5–5.0)
Alkaline Phosphatase: 48 U/L (ref 38–126)
Anion gap: 12 (ref 5–15)
BUN: 12 mg/dL (ref 6–20)
CO2: 26 mmol/L (ref 22–32)
Calcium: 9.7 mg/dL (ref 8.9–10.3)
Chloride: 99 mmol/L (ref 98–111)
Creatinine, Ser: 1.18 mg/dL (ref 0.61–1.24)
GFR, Estimated: >60 mL/min (ref >60)
Glucose, Bld: 135 mg/dL — ABNORMAL HIGH (ref 70–99)
Potassium: 3 mmol/L — ABNORMAL LOW (ref 3.5–5.1)
Sodium: 137 mmol/L (ref 135–145)
Total Bilirubin: 1.8 mg/dL — ABNORMAL HIGH (ref 0.0–1.2)
Total Protein: 7.6 g/dL (ref 6.5–8.1)

## 2023-10-15 LAB — CBG MONITORING, ED: Glucose-Capillary: 143 mg/dL — ABNORMAL HIGH (ref 70–99)

## 2023-10-15 LAB — URINALYSIS, ROUTINE W REFLEX MICROSCOPIC
Bacteria, UA: NONE SEEN
Bilirubin Urine: NEGATIVE
Glucose, UA: NEGATIVE mg/dL
Hgb urine dipstick: NEGATIVE
Ketones, ur: 20 mg/dL — AB
Leukocytes,Ua: NEGATIVE
Nitrite: NEGATIVE
Protein, ur: NEGATIVE mg/dL
RBC / HPF: 0 RBC/hpf (ref 0–5)
Specific Gravity, Urine: 1.011 (ref 1.005–1.030)
pH: 8 (ref 5.0–8.0)

## 2023-10-15 LAB — CBC
HCT: 43.1 % (ref 39.0–52.0)
Hemoglobin: 15.2 g/dL (ref 13.0–17.0)
MCH: 30.6 pg (ref 26.0–34.0)
MCHC: 35.3 g/dL (ref 30.0–36.0)
MCV: 86.7 fL (ref 80.0–100.0)
Platelets: 280 10*3/uL (ref 150–400)
RBC: 4.97 MIL/uL (ref 4.22–5.81)
RDW: 11.6 % (ref 11.5–15.5)
WBC: 6.6 10*3/uL (ref 4.0–10.5)
nRBC: 0 % (ref 0.0–0.2)

## 2023-10-15 LAB — RESP PANEL BY RT-PCR (RSV, FLU A&B, COVID)  RVPGX2
Influenza A by PCR: NEGATIVE
Influenza B by PCR: NEGATIVE
Resp Syncytial Virus by PCR: NEGATIVE
SARS Coronavirus 2 by RT PCR: NEGATIVE

## 2023-10-15 LAB — LIPASE, BLOOD: Lipase: 50 U/L (ref 11–51)

## 2023-10-15 MED ORDER — POTASSIUM CHLORIDE CRYS ER 20 MEQ PO TBCR
40.0000 meq | EXTENDED_RELEASE_TABLET | Freq: Once | ORAL | Status: AC
  Administered 2023-10-15: 40 meq via ORAL
  Filled 2023-10-15: qty 2

## 2023-10-15 MED ORDER — ONDANSETRON 4 MG PO TBDP: 4.0000 mg | ORAL_TABLET | Freq: Four times a day (QID) | ORAL | 0 refills | Status: DC | PRN

## 2023-10-15 MED ORDER — ONDANSETRON HCL 4 MG/2ML IJ SOLN
4.0000 mg | INTRAMUSCULAR | Status: AC
  Administered 2023-10-15: 4 mg via INTRAVENOUS
  Filled 2023-10-15: qty 2

## 2023-10-15 MED ORDER — LOSARTAN POTASSIUM 50 MG PO TABS
25.0000 mg | ORAL_TABLET | ORAL | Status: AC
  Administered 2023-10-15: 25 mg via ORAL
  Filled 2023-10-15: qty 1

## 2023-10-15 MED ORDER — SODIUM CHLORIDE 0.9 % IV BOLUS
1000.0000 mL | Freq: Once | INTRAVENOUS | Status: AC
  Administered 2023-10-15: 1000 mL via INTRAVENOUS

## 2023-10-15 NOTE — ED Notes (Signed)
 42 yom lying supine in the bed with his head elevated. The pt is warm, pink, and dry. The pt advised he has since last night been having abdominal pain, nausea, and vomiting. The pt is alert and oriented x 4.

## 2023-10-15 NOTE — ED Triage Notes (Signed)
 Patient c/o nausea since last night and vomiting x5. Denies sick contacts. Reports he "may be diabetic".

## 2023-10-15 NOTE — Discharge Instructions (Signed)
 As we discussed, please follow-up closely with your doctor at [facility].  Please return to the emergency room right away if you are to develop a fever, severe nausea, your pain becomes severe or worsens, you are unable to keep food down, begin vomiting any dark or bloody fluid, you develop any dark or bloody stools, feel dehydrated, or other new concerns or symptoms arise.

## 2023-10-15 NOTE — ED Provider Notes (Signed)
 [facility] Provider Note    Event Date/Time   First MD Initiated Contact with Patient 10/15/23 [phone]     (approximate)   History   Nausea   HPI  Ricky Leonard is a 43 y.o. male history of prothrombin gene mutation, prior DVT   Patient reports that he started feeling he was having some chills this morning and then vomited nonbloody about 4-5 times.  He is not having any stomach pain he felt better after he threw up.  Last night he ate healthy had pancakes bacon and felt well throughout the day.  Feels slight nausea.  But no diarrhea  Patient reports he had the same symptoms about a few weeks ago he came to the ER, symptoms went away.  He does not use any alcohol.  No chest pain or trouble breathing.  No headache no runny nose no cough\    Symptoms have hypotension at night taking his losartan or carvedilol today   He is feeling quite a bit better right now  Physical Exam   Triage Vital Signs: ED Triage Vitals  Encounter Vitals Group     BP 10/15/23 0732 (!) 171/124     Systolic BP Percentile --      Diastolic BP Percentile --      Pulse Rate 10/15/23 0732 86     Resp 10/15/23 0732 18     Temp 10/15/23 0732 98.1 F (36.7 C)     Temp Source 10/15/23 0732 Oral     SpO2 10/15/23 0732 96 %     Weight 10/15/23 0735 251 lb 5.2 oz (114 kg)     Height 10/15/23 0735 5\' 10"  (1.778 m)     Head Circumference --      Peak Flow --      Pain Score 10/15/23 0733 2     Pain Loc --      Pain Education --      Exclude from Growth Chart --     Most recent vital signs: Vitals:   10/15/23 0732  BP: (!) 171/124  Pulse: 86  Resp: 18  Temp: 98.1 F (36.7 C)  SpO2: 96%     General: Awake, no distress.  CV:  Good peripheral perfusion.  Normal tones and rate Resp:  Normal effort.  Clear lungs bilaterally Abd:  No distention.  No pain to palpation in any area of the abdomen.  Patient reports no ongoing abdominal pain.  No right upper quadrant pain no  pain McBurney's point.  All quadrants nontender Other:  Not fully awake alert nontoxic no active emesis.  Patient also reports his symptoms have improved   ED Results / Procedures / Treatments   Labs (all labs ordered are listed, but only abnormal results are displayed) Labs Reviewed  COMPREHENSIVE METABOLIC PANEL WITH GFR - Abnormal; Notable for the following components:      Result Value   Potassium 3.0 (*)    Glucose, Bld 135 (*)    Total Bilirubin 1.8 (*)    All other components within normal limits  URINALYSIS, ROUTINE W REFLEX MICROSCOPIC - Abnormal; Notable for the following components:   Color, Urine YELLOW (*)    APPearance CLEAR (*)    Ketones, ur 20 (*)    All other components within normal limits  CBG MONITORING, ED - Abnormal; Notable for the following components:   Glucose-Capillary 143 (*)    All other components within normal limits  RESP PANEL BY RT-PCR (RSV,  FLU A&B, COVID)  RVPGX2  CBC  LIPASE, BLOOD     EKG     RADIOLOGY US ABDOMEN LIMITED RUQ (LIVER/GB) Result Date: 10/15/2023 CLINICAL DATA:  12 hour history of abdominal pain. EXAM: ULTRASOUND ABDOMEN LIMITED RIGHT UPPER QUADRANT COMPARISON:  None Available. FINDINGS: Gallbladder: Multiple echogenic gallstones evident measuring up to about 7 mm in size. Gallbladder wall thickness upper normal at 3 mm. No pericholecystic fluid. Sonographer reports no sonographic Murphy sign. Common bile duct: Diameter: 3 mm Liver: Echogenic liver parenchyma suggests a degree of underlying hepatic steatosis. No focal liver abnormality. Portal vein is patent on color Doppler imaging with normal direction of blood flow towards the liver. Other: None. IMPRESSION: 1. Cholelithiasis without gallbladder wall thickening or pericholecystic fluid. Sonographer reports no sonographic Murphy sign. 2. Echogenic liver parenchyma suggests a degree of underlying hepatic steatosis. Electronically Signed   By: Kennith Center M.D.   On: 10/15/2023  08:28      PROCEDURES:  Critical Care performed: No  Procedures   MEDICATIONS ORDERED IN ED: Medications  sodium chloride 0.9 % bolus 1,000 mL (0 mLs Intravenous Stopped 10/15/23 0927)  ondansetron (ZOFRAN) injection 4 mg (4 mg Intravenous Given 10/15/23 0809)  losartan (COZAAR) tablet 25 mg (25 mg Oral Given 10/15/23 0833)  potassium chloride SA (KLOR-CON M) CR tablet 40 mEq (40 mEq Oral Given 10/15/23 1002)  ondansetron (ZOFRAN) injection 4 mg (4 mg Intravenous Given 10/15/23 1014)     IMPRESSION / MDM / ASSESSMENT AND PLAN / ED COURSE  I reviewed the triage vital signs and the nursing notes.                              Differential diagnosis includes, but is not limited to, gastritis, peptic ulcer disease, pancreatitis cholelithiasis, food poisoning, food intolerance, viral syndrome etc.  Fully awake and alert.  Symptoms of feeling slight chills along with nausea vomiting.  Has he is presently awake alert nontoxic.  Had a recent CT scan that revealed cholelithiasis, he does not appear to have any ongoing symptoms or associated right upper quadrant pain today.  But given his history of recurrent episodes of vomiting will obtain right upper quadrant ultrasound to evaluate sure no evidence of early cholecystitis or biliary obstruction.  Reassuring exam the fact that his symptoms of faded and is reassuring as well.  Will check CBC comprehensive metabolic panel lipase.  Patient's presentation is most consistent with acute complicated illness / injury requiring diagnostic workup.  Reassuring workup.  Comprehensive metabolic panel shows mild to moderate hypokalemia will replete.  Renal function improved from previous.  Slightly uptrending bilirubin.  No obvious symptoms of acute hepatocellular impairment noted at this time for hepatic biliary obstruction.  Patient is resting comfortably question if there could be an underlying degree of fatty liver or more chronic disease.  Discussed with the  patient and his mother, they will follow-up with [place] [facility].  Workup reassuring.  Patient able to eat and drink after receiving antiemetics.  He is comfortable with plan for discharge.  Had recent CT imaging just a couple weeks ago for similar symptoms which was reassuring, clinically I do not think that there would be benefit to repeat imaging at this time based on his current presentation and symptomatology.    Clinical Course as of 10/15/23 1144  Thu Oct 15, 2023  0907 Radiology downtime procedurei n place. I can not review imaging independently at this time.  [  MQ]    Clinical Course User Index [MQ] Sharyn Creamer, MD   ----------------------------------------- 11:44 AM on 10/15/2023 ----------------------------------------- Resting comfortably feels improved.  Awake alert oriented.  Return precautions and treatment recommendations and follow-up discussed with the patient who is agreeable with the plan.   FINAL CLINICAL IMPRESSION(S) / ED DIAGNOSES   Final diagnoses:  Gallstones  Acute gastritis without hemorrhage, unspecified gastritis type  Nausea and vomiting, unspecified vomiting type     Rx / DC Orders   ED Discharge Orders          Ordered    ondansetron (ZOFRAN-ODT) 4 MG disintegrating tablet  Every 6 hours PRN        10/15/23 1141             Note:  This document was prepared using Dragon voice recognition software and may include unintentional dictation errors.   Sharyn Creamer, MD 10/15/23 1145

## 2024-05-12 ENCOUNTER — Ambulatory Visit: Admitting: Sleep Medicine

## 2024-05-23 ENCOUNTER — Ambulatory Visit: Admitting: Sleep Medicine

## 2024-05-23 ENCOUNTER — Encounter: Payer: Self-pay | Admitting: Sleep Medicine

## 2024-05-23 VITALS — BP 120/82 | HR 57 | Temp 97.7°F | Ht 70.0 in | Wt 242.8 lb

## 2024-05-23 DIAGNOSIS — G4733 Obstructive sleep apnea (adult) (pediatric): Secondary | ICD-10-CM

## 2024-05-23 DIAGNOSIS — E669 Obesity, unspecified: Secondary | ICD-10-CM | POA: Diagnosis not present

## 2024-05-23 DIAGNOSIS — Z6834 Body mass index (BMI) 34.0-34.9, adult: Secondary | ICD-10-CM

## 2024-05-23 DIAGNOSIS — I10 Essential (primary) hypertension: Secondary | ICD-10-CM

## 2024-05-23 NOTE — Patient Instructions (Signed)
 SABRA

## 2024-05-23 NOTE — Progress Notes (Signed)
 Name:Ricky Leonard MRN: [id number] DOB: 1980-11-20   CHIEF COMPLAINT:  EXCESSIVE DAYTIME SLEEPINESS   HISTORY OF PRESENT ILLNESS: Ricky Leonard is a 43 y.o. w/ a h/o HTN and obesity who present for c/o loud snoring, witnessed apnea and excessive daytime sleepiness which has been present for several years. Reports nocturnal awakenings due to nocturia, and has difficulty falling back to sleep. Reports a 60 lb weight loss. Admits to dry mouth and headaches. Denies RLS symptoms, dream enactment, cataplexy, hypnagogic or hypnapompic hallucinations. Reports a family history of sleep apnea. Reports occasional drowsy driving. Drinks 2 cups of coffee and 2 cups of tea daily, occasional alcohol use, smokes cigarettes daily, smokes marijuana daily.   Bedtime 7-9 pm Sleep onset 1 hour Rise time 7 am   EPWORTH SLEEP SCORE 15    05/23/2024    9:00 AM  Results of the Epworth flowsheet  Sitting and reading 2  Watching TV 2  Sitting, inactive in a public place (e.g. a theatre or a meeting) 2  As a passenger in a car for an hour without a break 3  Lying down to rest in the afternoon when circumstances permit 1  Sitting and talking to someone 1  Sitting quietly after a lunch without alcohol 2  In a car, while stopped for a few minutes in traffic 2  Total score 15    PAST MEDICAL HISTORY :   has a past medical history of DVT (deep venous thrombosis) (HCC), Hypertension, and Obesity.  has a past surgical history that includes No past surgeries. Prior to Admission medications   Medication Sig Start Date End Date Taking? Authorizing Provider  acetaminophen  (TYLENOL ) 500 MG tablet Take 1 tablet by mouth every 6 (six) hours as needed. 08/07/19  Yes [provider]  albuterol  (VENTOLIN  HFA) 108 (90 Base) MCG/ACT inhaler Inhale 2-4 puffs by mouth every 4 hours as needed for wheezing, cough, and/or shortness of breath 04/17/19  Yes Gordan Huxley, MD  amLODipine (NORVASC) 10 MG tablet Take 10  mg by mouth daily. 08/17/23  Yes [provider]  aspirin  EC 325 MG tablet Take 1 tablet (325 mg total) by mouth every 6 (six) hours as needed for mild pain or moderate pain. 11/18/18  Yes Viviann Pastor, MD  brompheniramine-pseudoephedrine-DM 30-2-10 MG/5ML syrup Take 5 mLs by mouth 4 (four) times daily as needed. 07/03/20  Yes Claudene Tanda POUR, PA-C  buPROPion (WELLBUTRIN) 100 MG tablet Take 100 mg by mouth 2 (two) times a day.   Yes [provider]  carvedilol  (COREG ) 12.5 MG tablet Take 1 tablet (12.5 mg total) by mouth 2 (two) times daily with a meal. 06/16/16  Yes Laurence Bridegroom, MD  cyclobenzaprine  (FLEXERIL ) 5 MG tablet Take 1 tablet (5 mg total) by mouth 3 (three) times daily as needed. 07/04/22  Yes Menshew, Candida LULLA Kings, PA-C  hydrochlorothiazide  (HYDRODIURIL ) 25 MG tablet Take 1 tablet (25 mg total) by mouth daily. 07/18/18  Yes Dicky Anes, MD  HYDROcodone -homatropine (HYCODAN) 5-1.5 MG/5ML syrup Take 5 mLs by mouth every 6 (six) hours as needed for cough. 04/17/19  Yes Gordan Huxley, MD  ibuprofen  (ADVIL ) 600 MG tablet Take 1 tablet (600 mg total) by mouth every 8 (eight) hours as needed. 07/03/20  Yes Claudene Tanda POUR, PA-C  lidocaine  (LIDODERM ) 5 % Place 1 patch onto the skin daily. Remove & Discard patch within 12 hours or as directed by MD 08/07/19  Yes Alona Knee, PA-C  losartan  (COZAAR )  25 MG tablet Take 25 mg by mouth at bedtime.   Yes [provider]  losartan -hydrochlorothiazide  (HYZAAR) 100-25 MG tablet Take 1 tablet by mouth daily. 06/05/23  Yes [provider]  ondansetron  (ZOFRAN -ODT) 4 MG disintegrating tablet Take 1 tablet (4 mg total) by mouth every 6 (six) hours as needed for nausea or vomiting. 10/15/23  Yes Dicky Anes, MD  benzonatate  (TESSALON  PERLES) 100 MG capsule Take 1-2 tabs TID prn cough Patient not taking: Reported on 05/23/2024 05/11/18   Menshew, Candida LULLA Kings, PA-C  methocarbamol  (ROBAXIN ) 500 MG tablet Take 1 tablet (500 mg  total) by mouth 2 (two) times daily. Patient not taking: Reported on 05/23/2024 09/08/18   Donah Riis A, PA-C  predniSONE  (DELTASONE ) 10 MG tablet Take 5 tablets (50 mg total) by mouth daily. Patient not taking: Reported on 05/23/2024 08/01/18   Herlinda Belton B, FNP   Allergies  Allergen Reactions   Dust Mite Extract Cough   Pollen Extract Other (See Comments)    Seasonal allergies, sneezing, runny nose, itchy eyes    FAMILY HISTORY:  family history includes Diabetes in his maternal grandmother; Hypertension in an other family member. SOCIAL HISTORY:  reports that he has been smoking cigarettes. He has never used smokeless tobacco. He reports current alcohol use. He reports current drug use. Drug: Marijuana.   Review of Systems:  Gen:  Denies  fever, sweats, chills weight loss  HEENT: Denies blurred vision, double vision, ear pain, eye pain, hearing loss, nose bleeds, sore throat Cardiac:  No dizziness, chest pain or heaviness, chest tightness,edema, No JVD Resp:   No cough, -sputum production, -shortness of breath,-wheezing, -hemoptysis,  Gi: Denies swallowing difficulty, stomach pain, nausea or vomiting, diarrhea, constipation, bowel incontinence Gu:  Denies bladder incontinence, burning urine Ext:   Denies Joint pain, stiffness or swelling Skin: Denies  skin rash, easy bruising or bleeding or hives Endoc:  Denies polyuria, polydipsia , polyphagia or weight change Psych:   Denies depression, insomnia or hallucinations  Other:  All other systems negative  VITAL SIGNS: BP 120/82   Pulse (!) 57   Temp 97.7 F (36.5 C)   Ht 5' 10 (1.778 m)   Wt 242 lb 12.8 oz (110.1 kg)   SpO2 98%   BMI 34.84 kg/m    Physical Examination:   General Appearance: No distress  EYES PERRLA, EOM intact.   NECK Supple, No JVD Pulmonary: normal breath sounds, No wheezing.  CardiovascularNormal S1,S2.  No m/r/g.   Abdomen: Benign, Soft, non-tender. Skin:   warm, no rashes, no ecchymosis   Extremities: normal, no cyanosis, clubbing. Neuro:without focal findings,  speech normal  PSYCHIATRIC: Mood, affect within normal limits.   ASSESSMENT AND PLAN  OSA I suspect that OSA is likely present due to clinical presentation. Discussed the consequences of untreated sleep apnea. Advised not to drive drowsy for safety of patient and others. Will complete further evaluation with a home sleep study and follow up to review results.    HTN Stable, on current management. Following with PCP.   Obesity Counseled patient on diet and lifestyle modification.   Insomnia Counseled patient on stimulus control and improving sleep hygiene practices.    MEDICATION ADJUSTMENTS/LABS AND TESTS ORDERED: Recommend Sleep Study   Patient  satisfied with Plan of action and management. All questions answered  Follow up to review HST results and treatment plan.   I spent a total of 37 minutes reviewing chart data, face-to-face evaluation with the patient, counseling and coordination  of care as detailed above.    Kynzley Dowson, M.D.  Sleep Medicine [facility]

## 2024-06-27 ENCOUNTER — Encounter: Payer: Self-pay | Admitting: Emergency Medicine

## 2024-06-27 ENCOUNTER — Emergency Department

## 2024-06-27 ENCOUNTER — Observation Stay
Admission: EM | Admit: 2024-06-27 | Discharge: 2024-06-28 | Disposition: A | Attending: Internal Medicine | Admitting: Internal Medicine

## 2024-06-27 ENCOUNTER — Emergency Department
Admission: EM | Admit: 2024-06-27 | Discharge: 2024-06-27 | Disposition: A | Discharge status: Home / Self Care | Attending: Emergency Medicine | Admitting: Emergency Medicine

## 2024-06-27 ENCOUNTER — Other Ambulatory Visit: Payer: Self-pay

## 2024-06-27 DIAGNOSIS — R29702 NIHSS score 2: Secondary | ICD-10-CM | POA: Diagnosis not present

## 2024-06-27 DIAGNOSIS — H4312 Vitreous hemorrhage, left eye: Secondary | ICD-10-CM

## 2024-06-27 DIAGNOSIS — F17210 Nicotine dependence, cigarettes, uncomplicated: Secondary | ICD-10-CM | POA: Insufficient documentation

## 2024-06-27 DIAGNOSIS — E237 Disorder of pituitary gland, unspecified: Secondary | ICD-10-CM | POA: Diagnosis not present

## 2024-06-27 DIAGNOSIS — R519 Headache, unspecified: Secondary | ICD-10-CM | POA: Insufficient documentation

## 2024-06-27 DIAGNOSIS — I63549 Cerebral infarction due to unspecified occlusion or stenosis of unspecified cerebellar artery: Secondary | ICD-10-CM

## 2024-06-27 DIAGNOSIS — I82B12 Acute embolism and thrombosis of left subclavian vein: Secondary | ICD-10-CM | POA: Insufficient documentation

## 2024-06-27 DIAGNOSIS — I63441 Cerebral infarction due to embolism of right cerebellar artery: Secondary | ICD-10-CM

## 2024-06-27 DIAGNOSIS — H538 Other visual disturbances: Secondary | ICD-10-CM | POA: Diagnosis not present

## 2024-06-27 DIAGNOSIS — H5340 Unspecified visual field defects: Secondary | ICD-10-CM | POA: Diagnosis present

## 2024-06-27 DIAGNOSIS — Z7982 Long term (current) use of aspirin: Secondary | ICD-10-CM | POA: Insufficient documentation

## 2024-06-27 DIAGNOSIS — F1210 Cannabis abuse, uncomplicated: Secondary | ICD-10-CM | POA: Insufficient documentation

## 2024-06-27 DIAGNOSIS — D6852 Prothrombin gene mutation: Secondary | ICD-10-CM | POA: Diagnosis present

## 2024-06-27 DIAGNOSIS — I63541 Cerebral infarction due to unspecified occlusion or stenosis of right cerebellar artery: Secondary | ICD-10-CM | POA: Diagnosis not present

## 2024-06-27 DIAGNOSIS — D352 Benign neoplasm of pituitary gland: Secondary | ICD-10-CM

## 2024-06-27 DIAGNOSIS — I10 Essential (primary) hypertension: Secondary | ICD-10-CM

## 2024-06-27 DIAGNOSIS — E119 Type 2 diabetes mellitus without complications: Secondary | ICD-10-CM | POA: Diagnosis not present

## 2024-06-27 DIAGNOSIS — Z79899 Other long term (current) drug therapy: Secondary | ICD-10-CM | POA: Diagnosis not present

## 2024-06-27 DIAGNOSIS — R7309 Other abnormal glucose: Secondary | ICD-10-CM | POA: Insufficient documentation

## 2024-06-27 DIAGNOSIS — Z8673 Personal history of transient ischemic attack (TIA), and cerebral infarction without residual deficits: Secondary | ICD-10-CM | POA: Insufficient documentation

## 2024-06-27 HISTORY — DX: Cerebral infarction, unspecified: I63.9

## 2024-06-27 LAB — DIFFERENTIAL
Abs Immature Granulocytes: 0.01 K/uL (ref 0.00–0.07)
Basophils Absolute: 0.1 K/uL (ref 0.0–0.1)
Basophils Relative: 2 %
Eosinophils Absolute: 0.6 K/uL — ABNORMAL HIGH (ref 0.0–0.5)
Eosinophils Relative: 12 %
Immature Granulocytes: 0 %
Lymphocytes Relative: 34 %
Lymphs Abs: 1.7 K/uL (ref 0.7–4.0)
Monocytes Absolute: 0.3 K/uL (ref 0.1–1.0)
Monocytes Relative: 7 %
Neutro Abs: 2.2 K/uL (ref 1.7–7.7)
Neutrophils Relative %: 45 %

## 2024-06-27 LAB — COMPREHENSIVE METABOLIC PANEL WITH GFR
ALT: 29 U/L (ref 0–44)
AST: 20 U/L (ref 15–41)
Albumin: 4.3 g/dL (ref 3.5–5.0)
Alkaline Phosphatase: 67 U/L (ref 38–126)
Anion gap: 9 (ref 5–15)
BUN: 16 mg/dL (ref 6–20)
CO2: 28 mmol/L (ref 22–32)
Calcium: 9.9 mg/dL (ref 8.9–10.3)
Chloride: 99 mmol/L (ref 98–111)
Creatinine, Ser: 1.05 mg/dL (ref 0.61–1.24)
GFR, Estimated: >60 mL/min (ref >60)
Glucose, Bld: 101 mg/dL — ABNORMAL HIGH (ref 70–99)
Potassium: 3.8 mmol/L (ref 3.5–5.1)
Sodium: 136 mmol/L (ref 135–145)
Total Bilirubin: 0.6 mg/dL (ref 0.0–1.2)
Total Protein: 6.9 g/dL (ref 6.5–8.1)

## 2024-06-27 LAB — PROTIME-INR
INR: 0.9 (ref 0.8–1.2)
Prothrombin Time: 12.8 s (ref 11.4–15.2)

## 2024-06-27 LAB — URINE DRUG SCREEN
Amphetamines: NEGATIVE
Amphetamines: NEGATIVE
Barbiturates: NEGATIVE
Barbiturates: NEGATIVE
Benzodiazepines: NEGATIVE
Benzodiazepines: NEGATIVE
Cocaine: NEGATIVE
Cocaine: NEGATIVE
Fentanyl: NEGATIVE
Fentanyl: NEGATIVE
Methadone Scn, Ur: NEGATIVE
Methadone Scn, Ur: NEGATIVE
Opiates: NEGATIVE
Opiates: NEGATIVE
Tetrahydrocannabinol: POSITIVE — AB
Tetrahydrocannabinol: POSITIVE — AB

## 2024-06-27 LAB — CBC
HCT: 44.6 % (ref 39.0–52.0)
Hemoglobin: 15.1 g/dL (ref 13.0–17.0)
MCH: 31.5 pg (ref 26.0–34.0)
MCHC: 33.9 g/dL (ref 30.0–36.0)
MCV: 92.9 fL (ref 80.0–100.0)
Platelets: 239 K/uL (ref 150–400)
RBC: 4.8 MIL/uL (ref 4.22–5.81)
RDW: 11.9 % (ref 11.5–15.5)
WBC: 5 K/uL (ref 4.0–10.5)
nRBC: 0 % (ref 0.0–0.2)

## 2024-06-27 LAB — APTT: aPTT: 27 s (ref 24–36)

## 2024-06-27 LAB — CBG MONITORING, ED: Glucose-Capillary: 117 mg/dL — ABNORMAL HIGH (ref 70–99)

## 2024-06-27 MED ORDER — SENNOSIDES-DOCUSATE SODIUM 8.6-50 MG PO TABS: 1.0000 | ORAL_TABLET | Freq: Every evening | ORAL | Status: DC | PRN

## 2024-06-27 MED ORDER — GADOBUTROL 1 MMOL/ML IV SOLN
7.5000 mL | Freq: Once | INTRAVENOUS | Status: AC | PRN
  Administered 2024-06-27: 17:00:00 7.5 mL via INTRAVENOUS

## 2024-06-27 MED ORDER — ATORVASTATIN CALCIUM 20 MG PO TABS
80.0000 mg | ORAL_TABLET | Freq: Every day | ORAL | Status: DC
  Administered 2024-06-28: 10:00:00 80 mg via ORAL
  Filled 2024-06-27 (×2): qty 4

## 2024-06-27 MED ORDER — STROKE: EARLY STAGES OF RECOVERY BOOK: Freq: Once | Status: AC

## 2024-06-27 MED ORDER — ACETAMINOPHEN 325 MG PO TABS: 650.0000 mg | ORAL_TABLET | ORAL | Status: DC | PRN

## 2024-06-27 MED ORDER — ASPIRIN 81 MG PO TBEC
81.0000 mg | DELAYED_RELEASE_TABLET | Freq: Every day | ORAL | Status: DC
  Administered 2024-06-27 – 2024-06-28 (×2): 81 mg via ORAL
  Filled 2024-06-27 (×2): qty 1

## 2024-06-27 MED ORDER — ACETAMINOPHEN 650 MG RE SUPP: 650.0000 mg | RECTAL | Status: DC | PRN

## 2024-06-27 MED ORDER — ALBUTEROL SULFATE (2.5 MG/3ML) 0.083% IN NEBU: 2.5000 mg | INHALATION_SOLUTION | Freq: Four times a day (QID) | RESPIRATORY_TRACT | Status: DC | PRN

## 2024-06-27 MED ORDER — ACETAMINOPHEN 160 MG/5ML PO SOLN: 650.0000 mg | ORAL | Status: DC | PRN

## 2024-06-27 MED ORDER — SODIUM CHLORIDE 0.9 % IV SOLN: INTRAVENOUS | Status: DC

## 2024-06-27 NOTE — Consult Note (Signed)
 Reason for Consult:evaluate for RD vs artery occlusion left eye Referring Physician: Dr. Waymond, [facility] ED MD Chief complaint: blurry vision, new floaters left ey  HPI: Ricky Leonard is an 43 y.o. male with history of prothrombin gene mutation and CVA, HTN, DVTs and complex past ocular history including  RIGHT EYE: central retinal vein occlusion, total Retinal detachment, mature white cataract LEFT EYE: branch retinal artery occlusion, retinal vein occlusion, s/p sectoral retinal photocoagulation.  He reports that he was in his usual state of health and this morning started noticing some new floaters in the left eye, now the vision in the left eye is a foggy, like looking through a screen.  He denies any new peripheral vision loss, eye pain or flashes of light.  Past Medical History:  Diagnosis Date   DVT (deep venous thrombosis) (HCC)    Hypertension    Obesity    Stroke (HCC)     ROS  Past Surgical History:  Procedure Laterality Date   NO PAST SURGERIES      Family History  Problem Relation Age of Onset   Hypertension Other    Diabetes Maternal Grandmother     Social History:  reports that he has been smoking cigarettes. He has never used smokeless tobacco. He reports current alcohol use. He reports current drug use. Drug: Marijuana.  Allergies: Allergies[1]  Prior to Admission medications  Medication Sig Start Date End Date Taking? Authorizing Provider  acetaminophen  (TYLENOL ) 500 MG tablet Take 1 tablet by mouth every 6 (six) hours as needed. 08/07/19   [provider]  albuterol  (VENTOLIN  HFA) 108 (90 Base) MCG/ACT inhaler Inhale 2-4 puffs by mouth every 4 hours as needed for wheezing, cough, and/or shortness of breath 04/17/19   Gordan Huxley, MD  amLODipine (NORVASC) 10 MG tablet Take 10 mg by mouth daily. 08/17/23   [provider]  aspirin  EC 325 MG tablet Take 1 tablet (325 mg total) by mouth every 6 (six) hours as needed for mild pain or moderate pain.  11/18/18   Viviann Pastor, MD  benzonatate  (TESSALON  PERLES) 100 MG capsule Take 1-2 tabs TID prn cough Patient not taking: Reported on 05/23/2024 05/11/18   Menshew, Candida LULLA Kings, PA-C  brompheniramine-pseudoephedrine-DM 30-2-10 MG/5ML syrup Take 5 mLs by mouth 4 (four) times daily as needed. 07/03/20   Claudene Tanda POUR, PA-C  buPROPion (WELLBUTRIN) 100 MG tablet Take 100 mg by mouth 2 (two) times a day.    [provider]  carvedilol  (COREG ) 12.5 MG tablet Take 1 tablet (12.5 mg total) by mouth 2 (two) times daily with a meal. 06/16/16   Laurence Bridegroom, MD  cyclobenzaprine  (FLEXERIL ) 5 MG tablet Take 1 tablet (5 mg total) by mouth 3 (three) times daily as needed. 07/04/22   Menshew, Candida LULLA Kings, PA-C  hydrochlorothiazide  (HYDRODIURIL ) 25 MG tablet Take 1 tablet (25 mg total) by mouth daily. 07/18/18   Dicky Anes, MD  HYDROcodone -homatropine (HYCODAN) 5-1.5 MG/5ML syrup Take 5 mLs by mouth every 6 (six) hours as needed for cough. 04/17/19   Gordan Huxley, MD  ibuprofen  (ADVIL ) 600 MG tablet Take 1 tablet (600 mg total) by mouth every 8 (eight) hours as needed. 07/03/20   Claudene Tanda POUR, PA-C  lidocaine  (LIDODERM ) 5 % Place 1 patch onto the skin daily. Remove & Discard patch within 12 hours or as directed by MD 08/07/19   Alona Knee, PA-C  losartan  (COZAAR ) 25 MG tablet Take 25 mg by mouth at bedtime.  [provider]  losartan -hydrochlorothiazide  (HYZAAR) 100-25 MG tablet Take 1 tablet by mouth daily. 06/05/23   [provider]  methocarbamol  (ROBAXIN ) 500 MG tablet Take 1 tablet (500 mg total) by mouth 2 (two) times daily. Patient not taking: Reported on 05/23/2024 09/08/18   Donah Riis A, PA-C  ondansetron  (ZOFRAN -ODT) 4 MG disintegrating tablet Take 1 tablet (4 mg total) by mouth every 6 (six) hours as needed for nausea or vomiting. 10/15/23   Dicky Anes, MD  predniSONE  (DELTASONE ) 10 MG tablet Take 5 tablets (50 mg total) by mouth daily. Patient not taking:  Reported on 05/23/2024 08/01/18   Herlinda Kirk NOVAK, FNP    Results for orders placed or performed during the hospital encounter of 06/27/24 (from the past 48 hours)  CBG monitoring, ED     Status: Abnormal   Collection Time: 06/27/24 11:58 AM  Result Value Ref Range   Glucose-Capillary 117 (H) 70 - 99 mg/dL    Comment: Glucose reference range applies only to samples taken after fasting for at least 8 hours.  Protime-INR     Status: None   Collection Time: 06/27/24 12:05 PM  Result Value Ref Range   Prothrombin Time 12.8 11.4 - 15.2 seconds   INR 0.9 0.8 - 1.2    Comment: (NOTE) INR goal varies based on device and disease states. Performed at [facility], [address] [place], [place] [address]   APTT     Status: None   Collection Time: 06/27/24 12:05 PM  Result Value Ref Range   aPTT 27 24 - 36 seconds    Comment: Performed at [facility], [address] [place], [place] [address]  CBC     Status: None   Collection Time: 06/27/24 12:05 PM  Result Value Ref Range   WBC 5.0 4.0 - 10.5 K/uL   RBC 4.80 4.22 - 5.81 MIL/uL   Hemoglobin 15.1 13.0 - 17.0 g/dL   HCT 55.3 60.9 - 47.9 %   MCV 92.9 80.0 - 100.0 fL   MCH 31.5 26.0 - 34.0 pg   MCHC 33.9 30.0 - 36.0 g/dL   RDW 88.0 88.4 - 84.4 %   Platelets 239 150 - 400 K/uL   nRBC 0.0 0.0 - 0.2 %    Comment: Performed at [facility], [address] [place], [place] [address]  Differential     Status: Abnormal   Collection Time: 06/27/24 12:05 PM  Result Value Ref Range   Neutrophils Relative % 45 %   Neutro Abs 2.2 1.7 - 7.7 K/uL   Lymphocytes Relative 34 %   Lymphs Abs 1.7 0.7 - 4.0 K/uL   Monocytes Relative 7 %   Monocytes Absolute 0.3 0.1 - 1.0 K/uL   Eosinophils Relative 12 %   Eosinophils Absolute 0.6 (H) 0.0 - 0.5 K/uL   Basophils Relative 2 %   Basophils Absolute 0.1 0.0 - 0.1 K/uL   Immature Granulocytes 0 %   Abs Immature Granulocytes 0.01 0.00 - 0.07 K/uL    Comment: Performed at  [facility], [address] [place], [place] [address]  Comprehensive metabolic panel     Status: Abnormal   Collection Time: 06/27/24 12:05 PM  Result Value Ref Range   Sodium 136 135 - 145 mmol/L   Potassium 3.8 3.5 - 5.1 mmol/L   Chloride 99 98 - 111 mmol/L   CO2 28 22 - 32 mmol/L   Glucose, Bld 101 (H) 70 - 99 mg/dL  Comment: Glucose reference range applies only to samples taken after fasting for at least 8 hours.   BUN 16 6 - 20 mg/dL   Creatinine, Ser 8.94 0.61 - 1.24 mg/dL   Calcium  9.9 8.9 - 10.3 mg/dL   Total Protein 6.9 6.5 - 8.1 g/dL   Albumin 4.3 3.5 - 5.0 g/dL   AST 20 15 - 41 U/L   ALT 29 0 - 44 U/L   Alkaline Phosphatase 67 38 - 126 U/L   Total Bilirubin 0.6 0.0 - 1.2 mg/dL   GFR, Estimated >39 >39 mL/min    Comment: (NOTE) Calculated using the CKD-EPI Creatinine Equation (2021)    Anion gap 9 5 - 15    Comment: Performed at [facility], [address] [place], [place] [address]    CT HEAD CODE STROKE WO CONTRAST (LKW 0-4.5h, LVO 0-24h) Result Date: 06/27/2024 EXAM: CT HEAD WITHOUT CONTRAST 06/27/2024 12:15:40 PM TECHNIQUE: CT of the head was performed without the administration of intravenous contrast. Automated exposure control, iterative reconstruction, and/or weight based adjustment of the mA/kV was utilized to reduce the radiation dose to as low as reasonably achievable. COMPARISON: None available. CLINICAL HISTORY: 43 year old male with acute neurological deficit, stroke suspected. FINDINGS: BRAIN AND VENTRICLES: Brain volume within normal limits. No acute hemorrhage. No evidence of acute infarct. Patchy and confluent bilaterally periventricular white matter hypodensity. Small but circumscribed and chronic appearing lacunar infarct in the right thalamus (series 3 image 17). Contralateral left thalamus more indeterminate appearing lacunar infarct (same image). Posterior fossa gray white differentiation within normal limits. Calcified  atherosclerosis at the skull base. Abnormally enlarged but nonspecific pituitary or sella turcica soft tissue, up to 12 mm craniocaudal (series 6 image 30). Recommend endocrine function tests, follow up pituitary MRI. No suspicious intracranial vascular hyperdensity. No hydrocephalus. No extra-axial collection. No mass effect or midline shift. ORBITS: Mild rightward gaze. SINUSES: Left maxillary alveolar recess retention cyst. Other paranasal sinuses, tympanic cavities and mastoids are well aerated. SOFT TISSUES AND SKULL: No acute soft tissue abnormality. No skull fracture. ALBERTA STROKE PROGRAM EARLY CT SCORE (ASPECTS): Ganglionic (caudate, IC, lentiform nucleus, insula, M1-M3): 7 Supraganglionic (M4-M6): 3 Total: 10 IMPRESSION: 1. No acute cortically based infarct or intracranial hemorrhage identified. ASPECTS 10. 2. Advanced chronic small vessel disease, including age-related changes involvement of the left thalamus. 3. Nonspecific pituitary / soft tissue enlargement. Recommend endocrine function tests, follow up pituitary MRI. 4. These results were communicated to Dr Matthews at 12:22 on 06/27/2024 by text page via the [facility] messaging system. Electronically signed by: Helayne Hurst MD 06/27/2024 12:23 PM EST RP Workstation: [id number]    Blood pressure (!) 137/108, pulse (!) 49, temperature 97.8 F (36.6 C), resp. rate 18, height 5' 10 (1.778 m), weight 101.4 kg, SpO2 96%.  Mental status: Alert and Oriented x 4, pleasant, conversant, ambulatory, accompanied by friend Candace.  Visual Acuity:  OD: Light perception only, right eye.  J2, (20/30 equivlent) near, without glasses left eye.  Pupils:  round/ reactive to light, both pupils, 4+ RAPD OD.  Motility:  Full/ orthophoric  Visual Fields:  LP only right eye, left eye there is notable deft to confrontation inferonasally, (which corresponds to the area of previous retinal laser treatment)  IOP:  soft  External/ Lids/ Lashes:  Normal  Anterior  Segment:  Conjunctiva:  Normal  OU  Cornea:  Normal  OU  Anterior Chamber: Normal  OU, no NVI.  Lens:   Dense white cataract right eye.  Left eye  lens is mostly clear, trace cortical cataract.  Posterior Segment: Dilated OS with 1% Tropicamide and 2.5% Phenylephrine  Anterior vitreous: few RBCs OS, negative Shafer's sign.   Discs:   Normal c/d ratio, no pallor, no edema, regressed neovascularization, whitened vessels emanating from the optic nerve head.   Macula:  Flat, grossly normal  Vessels/ Periphery: Very abnormal vascular pattern, with significant diffuse vascular sclerosis of the major arterioles.  Veins and venules are dilated and tortuous.  There is some regressed neovascularization associated with vitreous floaters, and multiple dot blot hemorrhages and intraretinal hemorrhages, but there are no large fresh vitreous hemorrhages.  There is sector retinal photocoagulation superiorly, retina is flat, no tears or retinal detachments detected.   Assessment/Plan: Functionally monocular patient, with poor visual potential in the right eye given history of total retinal detachment, vascular disease and mature catarct. Light perception.  Observe. Acute on chronic, severe retinal vascular disease.  Overall, Mr. Hobby eye exam is relatively stable compared to notes from my examination of him in clinic in 2018 at [facility].  He is experiencing some new symptoms of additional (currently mild) hemorrhage in the left eye.  Given his complexity of disease in the left eye, he needs to have regular evaluations by a retina specialist.  He has gotten the majority of his care at [facility] in the past.  He reports that his insurance is out of network for [employer].  He may benefit from additional outpatient treatment and evaluation in the left eye, such as intravitreal anti-VEGF injections.  If retinal and vitreous hemorrhages worsen he may require a vitrectomy.  I don't see signs of  current/new retinal artery occlusion or retinal detachment.  From ophthalmology standpoint, recommend outpatient followup with a retina specialist.  Adine Novak 06/27/2024, 2:57 PM         [1]  Allergies Allergen Reactions   Dust Mite Extract Cough   Pollen Extract Other (See Comments)    Seasonal allergies, sneezing, runny nose, itchy eyes

## 2024-06-27 NOTE — Progress Notes (Signed)
°   06/27/24 1335  Spiritual Encounters  Type of Visit Initial  Care provided to: Pt and family  Referral source Code page  Reason for visit Code  OnCall Visit Yes  Interventions  Spiritual Care Interventions Made Established relationship of care and support;Compassionate presence;Encouragement   Chaplain responded to Code Stroke.  Provided compassionate support to family and to patient.

## 2024-06-27 NOTE — ED Triage Notes (Signed)
 Patient ambulatory to triage with steady gait, without difficulty or distress noted; pt seen earlier for vision changes; called to return for possible admission due to MRI results

## 2024-06-27 NOTE — Code Documentation (Signed)
 Stroke Response Nurse Documentation Code Documentation  Ricky Leonard is a 43 y.o. male arriving to [facility] via [employer] [facility] on 06/27/2024 with past medical hx of HTN, DVT, prev stroke, current smoker, BRAO left eye, bilateral central retinal vein occlusion, vitreous hemorrhage of right eye with residual ischemia and right eye vision loss. On No antithrombotic. Code stroke was activated by ED.   Patient from home where he was LKW at 1000 and now complaining of left eye visual difficulty. Shortly after 10, pt began seeing squiggly lines and dark spots and blurriness of the left eye. Reported similar symptoms from previous right eye vision complications.   Stroke team at the bedside on patient arrival. Labs drawn and patient cleared for CT by Dr. Waymond. Patient to CT with team. NIHSS 2, see documentation for details and code stroke times. Patient with right hemianopia on exam. The following imaging was completed:  CT Head. Patient is not a candidate for IV Thrombolytic due to contraindicated, per MD. Patient is not a candidate for IR due to exam not consistent with LVO, per MD.   Care Plan: every 2 hour NIHSS, VS, swallow screen per order.    Bedside handoff with ED RN Candida CLEMENTEEN Burnard KANDICE Hershel  Stroke Response RN

## 2024-06-27 NOTE — Consult Note (Addendum)
 NEUROLOGY CONSULT NOTE   Date of service: Jul 11, 2024 Patient Name: Ricky Leonard MRN:  [id number] DOB:  [birth year] Chief Complaint: L eye blurry vision Requesting Provider: Waymond Lorelle Cummins, MD  History of Present Illness   Ricky Leonard is a 43 y.o. male with hx of prior CVA, HTN, DVT, prothrombin gene mutation on aspirin  no longer on anticoagulation, bilateral CRVO 2017, L eye BRAO 2017 2/2 prior superimposed BRVO, retinal detachment on R, chronic complete ischemia R eye unable to count fingers in that eye at baseline, hx L eye vitreous hemorrhage who presented with L eye blurry vision. LKW 1000. No other accompanying sx. NIHSS = 2 for chronic R eye visual impairment. Able to count fingers with L eye on my exam. States L eye blurry vision is mildly improved but not resolved, as if he is looking through a screen. Initially saw black zig zags and floaters in that eye when sx first started this AM. Does not have a headache, no history of migraines. TNK not considered 2/2 prior hx vitreous hemorrhage. Head CT personal review no ICH or acute process. CTA not performed 2/2 exam not c/w LVO  Neurology paged 12:05 Neurology arrival 12:09  LKW: 1000 Modified rankin score: 2-Slight disability-UNABLE to perform all activities but does not need assistance IV Thrombolysis: no contraindicated  NIHSS = 2 for R eye severe visual impairment (baseline)   ROS   Comprehensive ROS performed and pertinent positives documented in HPI   Past History   Past Medical History:  Diagnosis Date   DVT (deep venous thrombosis) (HCC)    Hypertension    Obesity    Stroke [facility])     Past Surgical History:  Procedure Laterality Date   NO PAST SURGERIES      Family History: Family History  Problem Relation Age of Onset   Hypertension Other    Diabetes Maternal Grandmother     Social History  reports that he has been smoking cigarettes. He has never used smokeless tobacco. He reports current alcohol  use. He reports current drug use. Drug: Marijuana.  Allergies[1]  Medications  Current Medications[2]  Vitals   Vitals:   2024/07/11 1153 11-Jul-2024 1200 2024/07/11 1224  BP: (!) 194/121 (!) 155/103   Pulse: 68 (!) 56   Resp: 19    Temp: 97.8 F (36.6 C)    SpO2: 93% 97%   Weight: 113.4 kg  101.4 kg  Height: 5' 10 (1.778 m)      Body mass index is 32.08 kg/m.   Physical Exam   Gen: patient lying on CT table, NAD CV: extremities appear well-perfused Resp: normal WOB  Neurologic exam MS: alert, oriented x4, follows commands Speech: no dysarthria, no aphasia CN: PERRL, VFF on L unable to count fingers on R (baseline), EOMI, sensation intact, face symmetric, hearing intact to voice Motor: 5/5 strength throughout Sensory: SILT Reflexes: 2+ symm with toes down bilat Coordination: FNF intact bilat Gait: deferred  Labs/Imaging/Neurodiagnostic studies   CBC:  Recent Labs  Lab July 11, 2024 1205  WBC 5.0  NEUTROABS 2.2  HGB 15.1  HCT 44.6  MCV 92.9  PLT 239   Basic Metabolic Panel:  Lab Results  Component Value Date   NA 137 10/15/2023   K 3.0 (L) 10/15/2023   CO2 26 10/15/2023   GLUCOSE 135 (H) 10/15/2023   BUN 12 10/15/2023   CREATININE 1.18 10/15/2023   CALCIUM  9.7 10/15/2023   GFRNONAA >60 10/15/2023   GFRAA >60 04/16/2019  Lipid Panel:  Lab Results  Component Value Date   LDLCALC 108 (H) 06/15/2016   HgbA1c: No results found for: HGBA1C Urine Drug Screen:     Component Value Date/Time   LABOPIA NONE DETECTED 06/14/2016 0012   COCAINSCRNUR NONE DETECTED 06/14/2016 0012   LABBENZ NONE DETECTED 06/14/2016 0012   AMPHETMU NONE DETECTED 06/14/2016 0012   THCU POSITIVE (A) 06/14/2016 0012   LABBARB NONE DETECTED 06/14/2016 0012    Alcohol Level No results found for: [facility] INR  Lab Results  Component Value Date   INR 0.9 06/27/2024   APTT  Lab Results  Component Value Date   APTT 27 06/27/2024   AED levels: No results found for: PHENYTOIN,  ZONISAMIDE, LAMOTRIGINE, LEVETIRACETA  CT Head without contrast(Personally reviewed): No ICH. Enlarged pituitary recommendation for endocrine tests and pituitary protocol MRI  ASSESSMENT   Ricky Leonard is a 43 y.o. male with hx of prior CVA, HTN, DVT, prothrombin gene mutation on aspirin  no longer on anticoagulation, bilateral CRVO 2017, L eye BRAO 2017 2/2 prior superimposed BRVO, retinal detachment on R, chronic complete ischemia R eye unable to count fingers in that eye at baseline, hx L eye vitreous hemorrhage who presented with L eye blurry vision, blackfloaters and zig zags like he is looking through a screen door. I suspect his current presentation is 2/2 primary ophthalmology issue and not retinal artery occlusion and would recommend ED consultation with them. If they are concerned for retinal artery occlusion would recommend admission for stroke workup.  RECOMMENDATIONS   - ED consultation to ophthalmology - If they are concerned for retinal artery occlusion / acute ischemia would recommend admission for stroke workup. Will f/u on their recommendations. - Patient will need MRI brain pituitary protocol while he is here ______________________________________________________________________    Signed, Elida CHRISTELLA Ross, MD [facility]   Addendum [phone]: per optho patient has L eye vitreous hemorrhage. Will order MRI brain pituitary protocol in ED and after that results he can likely be discharged from ED with close ophtho f/u. May need neuro f/u as well depending on MRI results. Will f/u with EDP after MRI completed.  [name], MD [facility] [phone]  If 7pm- 7am, please page neurology on call as listed in AMION.      [1]  Allergies Allergen Reactions   Dust Mite Extract Cough   Pollen Extract Other (See Comments)    Seasonal allergies, sneezing, runny nose, itchy eyes  [2] No current facility-administered medications for this  encounter.  Current Outpatient Medications:    acetaminophen  (TYLENOL ) 500 MG tablet, Take 1 tablet by mouth every 6 (six) hours as needed., Disp: , Rfl:    albuterol  (VENTOLIN  HFA) 108 (90 Base) MCG/ACT inhaler, Inhale 2-4 puffs by mouth every 4 hours as needed for wheezing, cough, and/or shortness of breath, Disp: 8 g, Rfl: 0   amLODipine (NORVASC) 10 MG tablet, Take 10 mg by mouth daily., Disp: , Rfl:    aspirin  EC 325 MG tablet, Take 1 tablet (325 mg total) by mouth every 6 (six) hours as needed for mild pain or moderate pain., Disp: 30 tablet, Rfl: 0   benzonatate  (TESSALON  PERLES) 100 MG capsule, Take 1-2 tabs TID prn cough (Patient not taking: Reported on 05/23/2024), Disp: 30 capsule, Rfl: 0   brompheniramine-pseudoephedrine-DM 30-2-10 MG/5ML syrup, Take 5 mLs by mouth 4 (four) times daily as needed., Disp: 120 mL, Rfl: 0   buPROPion (WELLBUTRIN) 100 MG tablet, Take 100 mg by mouth 2 (two)  times a day., Disp: , Rfl:    carvedilol  (COREG ) 12.5 MG tablet, Take 1 tablet (12.5 mg total) by mouth 2 (two) times daily with a meal., Disp: 60 tablet, Rfl: 2   cyclobenzaprine  (FLEXERIL ) 5 MG tablet, Take 1 tablet (5 mg total) by mouth 3 (three) times daily as needed., Disp: 15 tablet, Rfl: 0   hydrochlorothiazide  (HYDRODIURIL ) 25 MG tablet, Take 1 tablet (25 mg total) by mouth daily., Disp: 30 tablet, Rfl: 0   HYDROcodone -homatropine (HYCODAN) 5-1.5 MG/5ML syrup, Take 5 mLs by mouth every 6 (six) hours as needed for cough., Disp: 120 mL, Rfl: 0   ibuprofen  (ADVIL ) 600 MG tablet, Take 1 tablet (600 mg total) by mouth every 8 (eight) hours as needed., Disp: 15 tablet, Rfl: 0   lidocaine  (LIDODERM ) 5 %, Place 1 patch onto the skin daily. Remove & Discard patch within 12 hours or as directed by MD, Disp: 30 patch, Rfl: 0   losartan  (COZAAR ) 25 MG tablet, Take 25 mg by mouth at bedtime., Disp: , Rfl:    losartan -hydrochlorothiazide  (HYZAAR) 100-25 MG tablet, Take 1 tablet by mouth daily., Disp: , Rfl:     methocarbamol  (ROBAXIN ) 500 MG tablet, Take 1 tablet (500 mg total) by mouth 2 (two) times daily. (Patient not taking: Reported on 05/23/2024), Disp: 20 tablet, Rfl: 0   ondansetron  (ZOFRAN -ODT) 4 MG disintegrating tablet, Take 1 tablet (4 mg total) by mouth every 6 (six) hours as needed for nausea or vomiting., Disp: 20 tablet, Rfl: 0   predniSONE  (DELTASONE ) 10 MG tablet, Take 5 tablets (50 mg total) by mouth daily. (Patient not taking: Reported on 05/23/2024), Disp: 25 tablet, Rfl: 0

## 2024-06-27 NOTE — Progress Notes (Signed)
 CODE STROKE- PHARMACY COMMUNICATION   Time CODE STROKE called/page received: 1206  Time response to CODE STROKE was made (in person or via phone): Immediately  Time Stroke Kit retrieved from Pyxis (only if needed): N/A, patient with history of vitreous hemorrhage  Name of Provider/Nurse contacted: Dr. Matthews  Past Medical History:  Diagnosis Date   DVT (deep venous thrombosis) (HCC)    Hypertension    Obesity    Stroke [facility])    Ricky Leonard 06/27/2024  2:59 PM

## 2024-06-27 NOTE — ED Notes (Signed)
 Pt states he would like to get MRI done outpatient if possible, EDP notified.

## 2024-06-27 NOTE — ED Triage Notes (Signed)
 Pt comes with c/o seeing red spots in left eye. Pt states hx of stroke in 2016 with similar symptoms and lost vision in his right eye.   Pt states it is blurry and he is seeing spots.   Pt states he woke up at 7am. Pt noticed the spots little after 10a.

## 2024-06-27 NOTE — ED Provider Notes (Addendum)
 Care assumed of patient from outgoing provider.  See their note for initial history, exam and plan.  Clinical Course as of 06/27/24 1847  Mon Jun 27, 2024  1225 Neurology evaluated the patient, CT head without intracranial hemorrhage.  Given his history of vitreous hemorrhage, he is not a TNK candidate.  Recommended ophthalmology evaluation, if they are concerned about CRAO, he can be admitted here.  Consults ophthalmology who will come to see the patient.  Neurology once Optho has evaluated the patient. [TT]  1239 CT HEAD CODE STROKE WO CONTRAST (LKW 0-4.5h, LVO 0-24h) IMPRESSION: 1. No acute cortically based infarct or intracranial hemorrhage identified. ASPECTS 10. 2. Advanced chronic small vessel disease, including age-related changes involvement of the left thalamus. 3. Nonspecific pituitary / soft tissue enlargement. Recommend endocrine function tests, follow up pituitary MRI.   [TT]  1331 Independent review of labs, electrolyte severely deranged, no leukocytosis, coag panel is not elevated. [TT]  1348 Ophthalmology evaluating patient at the time.  States that based on history he suspects this is probably a vitreous hemorrhage. [TT]  1432 Also order pituitary MRI for him. [TT]  1502 History of CRVO, vitrous hemorrhage - blurry vision in L eye today at 10:30. No other focal deficits.  Activated code stroke. Dr. Matthews eval and not a TNK candidate.   Optho eval - likley acute on chronic vetrous hemorr. CT head with pituitary mass? []  f/u MRI w wo []  ophtho follow up as outpatient  [SM]  1642 Patient states that he is wanting to leave and do the MRI as an outpatient.  Called MRI and stated it would be approximately 1 more hour before they could come to get him for MRI.  Messaging Dr. Matthews who is the neurologist who saw him to discuss if this could be an outpatient test. [SM]  1845 MRI resulted, concern for acute infarct - called and told the patient to return for acute infarct.  States that  he would return immediately.  Holding room 17 for him. [SM]    Clinical Course User Index [SM] Suzanne Kirsch, MD [TT] Waymond Lorelle Cummins, MD     Suzanne Kirsch, MD 06/27/24 1550    Suzanne Kirsch, MD 06/27/24 1650    Suzanne Kirsch, MD 06/27/24 [phone]

## 2024-06-27 NOTE — H&P (Addendum)
 History and Physical    Patient: Ricky Leonard FMW:[id number] DOB: [birth year] DOA: 06/27/2024 DOS: the patient was seen and examined on 06/27/2024 PCP: [facility]  Patient coming from: Home  Chief Complaint:  Chief Complaint  Patient presents with   Visual Field Change   HPI: EBB CARELOCK is a 43 y.o. 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  Comorbidities include hypertension, obesity and chronic tobacco dependence.  He presented to the emergency room today after experiencing left eye blurry vision with black floaters.  Symptoms were consistent with his prior central retinal disease hence decided to come to the emergency room immediately.  Code stroke was called on presentation.  He was not a candidate for TNK due to prior history of vitreous hemorrhage.  Symptoms were of sudden onset while the patient was having coffee this morning.  Denied any associated headache, palpitations, nausea or vomiting.  He denied any other focal neurologic deficits.  In the ED, patient was seen and assessed by ophthalmology and found to have some mild hemorrhage in the left eye.  MRI subsequently obtained per recommendations of neurology revealed new 1.5 cm acute ischemic nonhemorrhagic right cerebellar infarct.  Incidental finding of 2.0 x 1.2 cm pituitary mass suggesting microadenoma was also noted.  Patient was referred to hospitalist service for further workup.  Currently referred to ophthalmology and neurology consult notes. Review of Systems: As mentioned in the history of present illness. All other systems reviewed and are negative. Past Medical History:  Diagnosis Date   DVT (deep venous thrombosis) (HCC)    Hypertension    Obesity    Stroke [facility])    Past Surgical  History:  Procedure Laterality Date   NO PAST SURGERIES     Social History:  reports that he has been smoking cigarettes. He has never used smokeless tobacco. He reports current alcohol use. He reports current drug use. Drug: Marijuana.  Allergies[1]  Family History  Problem Relation Age of Onset   Hypertension Other    Diabetes Maternal Grandmother     Prior to Admission medications  Medication Sig Start Date End Date Taking? Authorizing Provider  albuterol  (VENTOLIN  HFA) 108 (90 Base) MCG/ACT inhaler Inhale 2-4 puffs by mouth every 4 hours as needed for wheezing, cough, and/or shortness of breath 04/17/19  Yes Gordan Huxley, MD  amLODipine (NORVASC) 10 MG tablet Take 10 mg by mouth daily. 08/17/23  Yes [provider]  atorvastatin  (LIPITOR) 40 MG tablet Take 40 mg by mouth daily.   Yes [provider]  carvedilol  (COREG ) 12.5 MG tablet Take 1 tablet (12.5 mg total) by mouth 2 (two) times daily with a meal. 06/16/16  Yes Laurence Bridegroom, MD  losartan -hydrochlorothiazide  (HYZAAR) 100-25 MG tablet Take 1 tablet by mouth daily. 06/05/23  Yes [provider]  acetaminophen  (TYLENOL ) 500 MG tablet Take 1 tablet by mouth every 6 (six) hours as needed. Patient not taking: Reported on 06/27/2024 08/07/19   [provider]  aspirin  EC 325 MG tablet Take 1 tablet (325 mg total) by mouth every 6 (six) hours as needed for mild pain or moderate pain. Patient not taking: Reported on 06/27/2024 11/18/18   Viviann Pastor, MD  buPROPion (WELLBUTRIN) 100 MG tablet Take 100 mg by mouth 2 (two) times a day. Patient not taking: Reported  on 06/27/2024    [provider]  cyclobenzaprine  (FLEXERIL ) 5 MG tablet Take 1 tablet (5 mg total) by mouth 3 (three) times daily as needed. Patient not taking: Reported on 06/27/2024 07/04/22   Menshew, Candida LULLA Kings, PA-C  hydrochlorothiazide  (HYDRODIURIL ) 25 MG tablet Take 1 tablet (25 mg total) by mouth daily. Patient not taking:  Reported on 06/27/2024 07/18/18   Dicky Anes, MD  losartan  (COZAAR ) 25 MG tablet Take 25 mg by mouth at bedtime. Patient not taking: Reported on 06/27/2024    [provider]    Physical Exam: Vitals:   06/27/24 1940 06/27/24 1947 06/27/24 2030  BP:  (!) 155/98 (!) 144/74  Pulse:  67 66  Resp:  16 18  Temp:  98.4 F (36.9 C)   SpO2:  96% 97%  Weight: 101.4 kg    Height: 5' 10 (1.778 m)     General: Patient is a well-built/obese gentleman who was seen laying comfortably in bed. Head is atraumatic.\ HEENT: Visual impairment in right eye.  Patient is able to see with left eye.  Conjunctiva slightly erythematous bilaterally. Neck is supple with no JVD Chest: Clinically diminished bilaterally.  No added sounds appreciated however. Cardiovascular: Regular S1-S2 with no murmur Abdomen: Soft nontender Extremities without any pedal edema.  No lower extremity swelling. CNS: Difficult to assess cranial nerves.  Nerves examined were grossly intact.  No focal deficits however were noted. Data Reviewed: Sodium is 5 point WBCs 5.0, hemoglobin 15.1, hematocrit 45, platelet count 239, neutrophil 45 Sodium is 136, potassium 3.8, chloride 99, bicarb 28, glucose 101, BUN 16, creatinine 1.05, AST 20, ALT 29  MRI with the following findings: 1. 1.5 cm acute ischemic nonhemorrhagic right cerebellar infarct. 2. 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 3. Advanced chronic microvascular ischemic disease for age. 4. Age determinant abnormal FLAIR signal involving the posterior aspect of the right globe, possibly reflecting hemorrhage and/or changes of retinal pathology. Correlation with physical exam recommended.  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  He presents to the emergency room with new left-sided blurry vision and floaters.  No other focal deficits.  MRI with findings of acute right cerebellar stroke.  #1.  Acute ischemic nonhemorrhagic right cerebellar stroke.  New. Patient has been noncompliant with aspirin .  Resumed on this admission per  Neurology recommendations.  Patient will be placed on permissive hypertension protocol.  Continue with high-dose statins.  Repeat transthoracic echocardiogram with bubble study to evaluate for any valvular abnormalities.  CTA head and neck were ordered.  Neurology recommends stat CT if neuro checks to change.  Every 4 hours neurochecks advised.  Patient will be admitted to progressive unit.  #2.  New left eye visual impairment: Most likely due to right cerebellar stroke versus new acute left vitreous hemorrhage.Ophtlamology closely following due to prior history of BRVO and CRVOA.  Patient is clinically blind in right eye due to prior history of retinal detachment.  #3.  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  #4.  History of prothrombin gene mutation with high risk of venous coagulopathy.  Due to prior history of vitreous hemorrhage, patient has not been on anticoagulation.  Neurology did discuss case with neuroradiology, felt DVS was less likely.  T  #5 History of  type II diabetes mellitus: He is on diet control therapy.  Previously been on GLP-1.  #6.  Tobacco dependence: Tobacco cessation counseling was offered.  Will send for urine drug screen.   Advance Care Planning:   Code Status: Full Code   Consults: Neurology and ophthalmology consult note appreciated  Family Communication: None  Severity of Illness: The appropriate patient status for this patient is INPATIENT. Inpatient status is judged to be reasonable and necessary in order to provide the required intensity of service to ensure the patient's safety. The patient's  presenting symptoms, physical exam findings, and initial radiographic and laboratory data in the context of their chronic comorbidities is felt to place them at high risk for further clinical deterioration. Furthermore, it is not anticipated that the patient will be medically stable for discharge from the hospital within 2 midnights of admission.   * I certify that at the point of admission it is my clinical judgment that the patient will require inpatient hospital care spanning beyond 2 midnights from the point of admission due to high intensity of service, high risk for further deterioration and high frequency of surveillance required.*  Author: Maude MARLA Dart, MD 06/27/2024 9:21 PM  For on call review [email or web address].      [1]  Allergies Allergen Reactions   Dust Mite Extract Cough   Pollen Extract Other (See Comments)    Seasonal allergies, sneezing, runny nose, itchy eyes

## 2024-06-27 NOTE — ED Provider Notes (Signed)
 Ricky Leonard Provider Note    Event Date/Time   First MD Initiated Contact with Patient 06/27/24 1153     (approximate)   History   Eye Pain   HPI  Ricky Leonard is a 43 y.o. male with prior history of CVA, history of hypertension, DVT, prothrombin gene mutation, on aspirin , no longer on anticoagulation, presenting with blurry vision to the left eye.  Started slightly after 10 AM.  States that he woke up at 7 AM was fine today, states that he was fine until the blurry vision started shortly after 10.  He denies any eye pain or trauma.  States he has prior stroke that affected his right eye and is mostly blind out of that eye, able to see hand movements out of the right eye that is normal.  Does note intermittent floaters, intermittent wavy vision to his left eye, denies any flashes or curtains falling.  Denies any visual field cuts.  No slurred speech, no facial droop, no focal weakness or numbness.  On independent chart review, patient was seen by ophthalmology in 2022, had CRVO of his bilateral eye with poor baseline vision to his right eye.  Also has history of retinal detachment on the right.  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  History of retinal ischemia.  Has history of unprovoked left subclavian vein thrombus in 2017, has been off Eliquis  since 2018.     Physical Exam   Triage Vital Signs: ED Triage Vitals [06/27/24 1153]  Encounter Vitals Group     BP (!) 194/121     Girls Systolic BP Percentile      Girls Diastolic BP Percentile      Boys Systolic BP Percentile      Boys Diastolic BP Percentile      Pulse Rate 68     Resp 19     Temp 97.8 F (36.6 C)     Temp src      SpO2 93 %     Weight      Height      Head Circumference      Peak Flow      Pain Score 0     Pain Loc      Pain Education      Exclude from Growth Chart     Most recent vital signs: Vitals:   06/27/24 1305 06/27/24 1410  BP:  (!) 137/108   Pulse: (!) 49 (!) 49  Resp: 17 18  Temp:    SpO2: 97% 96%     General: Awake, no distress.  CV:  Good peripheral perfusion.  Resp:  Normal effort.  No tachypnea respiratory distress Abd:  No distention.  Soft nontender Other:  He notes blurry vision to the left eye, slight disconjugate gaze on the right.  Pupils are equal and reactive, extraocular movements are intact, no facial droop, no slurred speech, no focal weakness or numbness.  No periorbital swelling, no pain with extraocular movements.  He denies any visual field cuts.   ED Results / Procedures / Treatments   Labs (all labs ordered are listed, but only abnormal results are displayed) Labs Reviewed  DIFFERENTIAL - Abnormal; Notable for the following components:      Result Value   Eosinophils Absolute 0.6 (*)    All other components within normal limits  COMPREHENSIVE METABOLIC PANEL WITH GFR - Abnormal; Notable for the following components:   Glucose, Bld 101 (*)  All other components within normal limits  URINE DRUG SCREEN - Abnormal; Notable for the following components:   Tetrahydrocannabinol POSITIVE (*)    All other components within normal limits  CBG MONITORING, ED - Abnormal; Notable for the following components:   Glucose-Capillary 117 (*)    All other components within normal limits  PROTIME-INR  APTT  CBC     EKG  EKG shows, sinus node, rate 54, normal QS, normal QTc, no obvious ischemic ST elevation, T wave inversion to aVL, not significantly changed compared to prior   RADIOLOGY On my independent interpretation, CT head without obvious intracranial hemorrhage   PROCEDURES:  Critical Care performed: Yes, see critical care procedure note(s)  .Critical Care  Performed by: Waymond Lorelle Cummins, MD Authorized by: Waymond Lorelle Cummins, MD   Critical care provider statement:    Critical care time (minutes):  40   Critical care was time spent personally by me on the following activities:  Development of  treatment plan with patient or surrogate, discussions with consultants, evaluation of patient's response to treatment, examination of patient, ordering and review of laboratory studies, ordering and review of radiographic studies, ordering and performing treatments and interventions, pulse oximetry, re-evaluation of patient's condition and review of old charts    MEDICATIONS ORDERED IN ED: Medications - No data to display   IMPRESSION / MDM / ASSESSMENT AND PLAN / ED COURSE  I reviewed the triage vital signs and the nursing notes.                              Differential diagnosis includes, but is not limited to, CVA, CRAO, did consider retinal detachment the patient states that the floaters and waviness to his eye is intermittent.  He is not seeing that now.  Also considered TIA.  Did also consider vitreous detachment, hemorrhage, retinal detachment although he states that the floaters and waviness are intermittent and he does not see that now.  Given that his symptoms started at 10 AM.  Will activate stroke alert on him.  Labs, CT, bedside ultrasound.  Patient's presentation is most consistent with acute presentation with potential threat to life or bodily function.  Independent interpretation of labs and imaging below.  Clinical course as below.  Patient signed out pending MRI.  Will put in several numbers to call for [facility] as well as [facility] to follow-up with retinal specialist.   The patient is on the cardiac monitor to evaluate for evidence of arrhythmia and/or significant heart rate changes.   Clinical Course as of 06/27/24 1516  Mon Jun 27, 2024  1225 Neurology evaluated the patient, CT head without intracranial hemorrhage.  Given his history of vitreous hemorrhage, he is not a TNK candidate.  Recommended ophthalmology evaluation, if they are concerned about CRAO, he can be admitted here.  Consults ophthalmology who will come to see the patient.   Neurology once Optho has evaluated the patient. [TT]  1239 CT HEAD CODE STROKE WO CONTRAST (LKW 0-4.5h, LVO 0-24h) IMPRESSION: 1. No acute cortically based infarct or intracranial hemorrhage identified. ASPECTS 10. 2. Advanced chronic small vessel disease, including age-related changes involvement of the left thalamus. 3. Nonspecific pituitary / soft tissue enlargement. Recommend endocrine function tests, follow up pituitary MRI.   [TT]  1331 Independent review of labs, electrolyte severely deranged, no leukocytosis, coag panel is not elevated. [TT]  1348 Ophthalmology evaluating patient at the  time.  States that based on history he suspects this is probably a vitreous hemorrhage. [TT]  1432 Also order pituitary MRI for him. [TT]  1502 History of CRVO, vitrous hemorrhage - blurry vision in L eye today at 10:30. No other focal deficits.  Activated code stroke. Dr. Matthews eval and not a TNK candidate.   Optho eval - likley acute on chronic vetrous hemorr. CT head with pituitary mass? []  f/u MRI w wo []  ophtho follow up as outpatient  [SM]    Clinical Course User Index [SM] Suzanne Kirsch, MD [TT] Waymond Lorelle Cummins, MD     FINAL CLINICAL IMPRESSION(S) / ED DIAGNOSES   Final diagnoses:  Blurry vision  Vitreous hemorrhage of left eye (HCC)     Rx / DC Orders   ED Discharge Orders     None        Note:  This document was prepared using Dragon voice recognition software and may include unintentional dictation errors.    Waymond Lorelle Cummins, MD 06/27/24 [phone]

## 2024-06-27 NOTE — Plan of Care (Signed)
 Neurology plan of care  Please see stroke code consult note from me earlier today for detailed presentation and preliminary recommendations.   Ricky Leonard is a 43 y.o. male with hx of prior CVA, HTN, DVT, prothrombin gene mutation on aspirin  no longer on anticoagulation, bilateral CRVO 2017, L eye BRAO 2017 2/2 prior superimposed BRVO, retinal detachment on R, chronic complete ischemia R eye unable to count fingers in that eye at baseline, hx L eye vitreous hemorrhage who presented with L eye blurry vision, blackfloaters and zig zags like he is looking through a screen door. Stroke code was called on arrival 2/2 concern for CRAO but TNK was contraindicated 2/2 hx prior vitreous hemorrhage. He had no other acute focal neurologic deficits at presentation (including no dizziness, imbalance, or ataxia). Ophthalmology saw him in ED and diagnosed him with acute contralateral (L) vitreous hemorrhage.  Head CT during stroke code showed an enlarged pituitary therefore MRI brain wwo pituitary protocol was done in the ED which showed both a pituitary adenoma (expected) and an acute cerebellar ischemic stroke (asymptomatic, incidental). D/w Dr. Suzanne and recommended admission for stroke workup.  Patient has prothrombin gene mutation and is therefore at high risk of venous clots and has had 3 in the past. I spoke with Dr. Derrill with neuroradiology to ask if he needed CTV to r/o DVST contributing to ischemic infarct and he said that he was able to see enough post-contrast enhancement of the veins on MRI to effectively rule out DVST without CTV.  Updated recommendations:  - Admit for stroke workup - Permissive HTN x48 hrs from sx onset or until stroke ruled out by MRI goal BP <220/110 w/ PRN labetalol  or hydralazine  if BP above these parameters. Dr. Suzanne EDP discussed BP goals with ophtho and they are ok with this BP goal - Aspirin  81mg  daily. Will not do DAPT tonight given acute vitreous hemorrhage but per  Early Treatment Diabetic Retinopathy Study ETDRS aspirin  was not found to worsen vitreous hemorrhage  - CTA H&N - TTE w/ bubble - Check A1c and LDL + add statin per guidelines - q4 hr neuro checks - STAT head CT for any change in neuro exam - Tele - PT/OT/SLP - Stroke education - Amb referral to neurology upon discharge   Neurology will follow up tomorrow.  [name], MD [facility] [phone]  If 7pm- 7am, please page neurology on call as listed in AMION.

## 2024-06-27 NOTE — ED Notes (Signed)
 Pt taken to ED 17 and MD Tan at bedside assessing.

## 2024-06-27 NOTE — ED Notes (Signed)
CBG 117 

## 2024-06-27 NOTE — ED Notes (Signed)
 CCMD called for cardiac monitoring.

## 2024-06-27 NOTE — ED Notes (Signed)
 Code stroke  called to [facility]  at  12:02 pm

## 2024-06-27 NOTE — ED Notes (Signed)
 Provider notified that pt passed swallow screen.

## 2024-06-27 NOTE — Discharge Instructions (Addendum)
 Please make sure to call [facility] or [facility] to set up a follow-up with the retinal specialist.  You need to call your primary care physician in the morning and discuss ordering an MRI of your brain with and without contrast for further evaluation of questionable abnormality seen on your pituitary on CT scan of your head.  You are given a referral for neurology.  You were also given a list of outpatient neurologist Dr. Loreli and Dr. Lane on your follow-up paperwork.  Return to the emergency department for any worsening symptoms.

## 2024-06-27 NOTE — ED Notes (Signed)
 Pt not in room upon discharging- visitor states MRI took him to scan.

## 2024-06-27 NOTE — ED Notes (Addendum)
 MD to consult Neuro, LExie RN informed

## 2024-06-27 NOTE — ED Provider Notes (Signed)
 [facility] Provider Note    Event Date/Time   First MD Initiated Contact with Patient 06/27/24 1947     (approximate)   History   Visual Field Change   HPI  Ricky Leonard is a 43 y.o. male presents to the emergency department for a callback.  Patient had an MRI done earlier today after he was an activated code stroke with vision changes.  Was evaluated by neurology and ophthalmology and ultimately he wanted to leave prior to his MRI resulting.  MRI resulted as an acute infarct.  Called back to come back to the emergency department for stroke workup.  My evaluation no change or progression of his change in vision.  Denies any falls or trauma.     Physical Exam   Triage Vital Signs: ED Triage Vitals  Encounter Vitals Group     BP 06/27/24 1947 (!) 155/98     Girls Systolic BP Percentile --      Girls Diastolic BP Percentile --      Boys Systolic BP Percentile --      Boys Diastolic BP Percentile --      Pulse Rate 06/27/24 1947 67     Resp 06/27/24 1947 16     Temp 06/27/24 1947 98.4 F (36.9 C)     Temp src --      SpO2 06/27/24 1947 96 %     Weight 06/27/24 1940 223 lb 8.7 oz (101.4 kg)     Height 06/27/24 1940 5' 10 (1.778 m)     Head Circumference --      Peak Flow --      Pain Score 06/27/24 1940 0     Pain Loc --      Pain Education --      Exclude from Growth Chart --     Most recent vital signs: Vitals:   06/27/24 2030 06/27/24 2100  BP: (!) 144/74 (!) 144/98  Pulse: 66 68  Resp: 18 17  Temp:    SpO2: 97% 98%    Physical Exam Constitutional:      Appearance: He is well-developed.  HENT:     Head: Atraumatic.  Cardiovascular:     Rate and Rhythm: Regular rhythm.  Pulmonary:     Effort: No respiratory distress.  Musculoskeletal:     Cervical back: Normal range of motion.  Skin:    General: Skin is warm.  Neurological:     Mental Status: He is alert. Mental status is at baseline.     IMPRESSION / MDM / ASSESSMENT  AND PLAN / ED COURSE  I reviewed the triage vital signs and the nursing notes.  Differential diagnosis consistent with acute CVA given findings on MRI.  Also diagnosed with a vitreous hemorrhage and was evaluated by ophthalmology.   LABS (all labs ordered are listed, but only abnormal results are displayed) Labs interpreted as -    Labs Reviewed  URINE DRUG SCREEN - Abnormal; Notable for the following components:      Result Value   Tetrahydrocannabinol POSITIVE (*)    All other components within normal limits  HIV ANTIBODY (ROUTINE TESTING W REFLEX)  LIPID PANEL  HEMOGLOBIN A1C     MDM  I discussed the patient's case with Dr. Matthews who ordered the patient aspirin  but wanted to hold on any antiplatelets given his acute vitreous hemorrhage.  Discussed with radiologist and no signs of cerebral venous thrombosis.  Recommended admission to the hospitalist and holding  on any antiplatelets at this time.  Discussed with ophthalmology who did not have any blood pressure goals and stated that there was no restrictions for blood pressure with vitreous hemorrhage.     PROCEDURES:  Critical Care performed: No  Procedures  Patient's presentation is most consistent with acute presentation with potential threat to life or bodily function.   MEDICATIONS ORDERED IN ED: Medications  aspirin  EC tablet 81 mg (81 mg Oral Given 06/27/24 2030)  atorvastatin  (LIPITOR) tablet 80 mg (80 mg Oral Not Given 06/27/24 2133)  albuterol  (PROVENTIL ) (2.5 MG/3ML) 0.083% nebulizer solution 2.5 mg (has no administration in time range)   stroke: early stages of recovery book (has no administration in time range)  0.9 %  sodium chloride  infusion ( Intravenous New Bag/Given 06/27/24 2132)  acetaminophen  (TYLENOL ) tablet 650 mg (has no administration in time range)    Or  acetaminophen  (TYLENOL ) 160 MG/5ML solution 650 mg (has no administration in time range)    Or  acetaminophen  (TYLENOL ) suppository 650 mg  (has no administration in time range)  senna-docusate (Senokot-S) tablet 1 tablet (has no administration in time range)    FINAL CLINICAL IMPRESSION(S) / ED DIAGNOSES   Final diagnoses:  Cerebrovascular accident (CVA), unspecified mechanism (HCC)     Rx / DC Orders   ED Discharge Orders     None        Note:  This document was prepared using Dragon voice recognition software and may include unintentional dictation errors.   Suzanne Kirsch, MD 06/28/24 [phone]

## 2024-06-28 ENCOUNTER — Inpatient Hospital Stay (HOSPITAL_COMMUNITY): Admit: 2024-06-28 | Discharge: 2024-06-28 | Disposition: A | Attending: Internal Medicine

## 2024-06-28 ENCOUNTER — Other Ambulatory Visit: Payer: Self-pay

## 2024-06-28 ENCOUNTER — Inpatient Hospital Stay

## 2024-06-28 DIAGNOSIS — I63541 Cerebral infarction due to unspecified occlusion or stenosis of right cerebellar artery: Secondary | ICD-10-CM | POA: Diagnosis present

## 2024-06-28 DIAGNOSIS — E236 Other disorders of pituitary gland: Secondary | ICD-10-CM | POA: Insufficient documentation

## 2024-06-28 DIAGNOSIS — I639 Cerebral infarction, unspecified: Principal | ICD-10-CM | POA: Diagnosis present

## 2024-06-28 DIAGNOSIS — I6389 Other cerebral infarction: Secondary | ICD-10-CM

## 2024-06-28 LAB — LIPID PANEL
Cholesterol: 187 mg/dL (ref 0–200)
HDL: 60 mg/dL (ref >40)
LDL Cholesterol: 104 mg/dL — ABNORMAL HIGH (ref 0–99)
Total CHOL/HDL Ratio: 3.1 ratio
Triglycerides: 116 mg/dL (ref <150)
VLDL: 23 mg/dL (ref 0–40)

## 2024-06-28 LAB — HEMOGLOBIN A1C
Hgb A1c MFr Bld: 5 % (ref 4.8–5.6)
Mean Plasma Glucose: 96.8 mg/dL

## 2024-06-28 LAB — ECHOCARDIOGRAM COMPLETE
Area-P 1/2: 3.7 cm2
Height: 70 in
S' Lateral: 2.9 cm
Weight: 3576.74 [oz_av]

## 2024-06-28 LAB — HIV ANTIBODY (ROUTINE TESTING W REFLEX): HIV Screen 4th Generation wRfx: NONREACTIVE

## 2024-06-28 MED ORDER — IOHEXOL 350 MG/ML SOLN
75.0000 mL | Freq: Once | INTRAVENOUS | Status: AC | PRN
  Administered 2024-06-28: 12:00:00 75 mL via INTRAVENOUS

## 2024-06-28 MED ORDER — APIXABAN 5 MG PO TABS
5.0000 mg | ORAL_TABLET | Freq: Two times a day (BID) | ORAL | 0 refills | Status: AC
  Filled 2024-06-28: qty 60, 30d supply, fill #0

## 2024-06-28 MED ORDER — ASPIRIN 81 MG PO TBEC
81.0000 mg | DELAYED_RELEASE_TABLET | Freq: Every day | ORAL | 0 refills | Status: AC
  Filled 2024-06-28: qty 3, 3d supply, fill #0

## 2024-06-28 MED ORDER — ATORVASTATIN CALCIUM 80 MG PO TABS
80.0000 mg | ORAL_TABLET | Freq: Every day | ORAL | 0 refills | Status: AC
  Filled 2024-06-28: qty 30, 30d supply, fill #0

## 2024-06-28 MED ORDER — PERFLUTREN LIPID MICROSPHERE
1.0000 mL | INTRAVENOUS | Status: AC | PRN
  Administered 2024-06-28: 08:00:00 3 mL via INTRAVENOUS

## 2024-06-28 NOTE — Discharge Instructions (Addendum)
 Follow-up with PCP in 1 week. Follow-up with neurology in 1 month. Follow-up with ophthalmology as previous scheduled. Start Eliquis  in 3 days only if there is no new neurologic symptoms, discontinue aspirin  when Eliquis  started.

## 2024-06-28 NOTE — Discharge Summary (Signed)
 Physician Discharge Summary   Patient: Ricky Leonard MRN: [id number] DOB: 1980/10/11  Admit date:     06/27/2024  Discharge date: 06/28/2024  Discharge Physician: Murvin Mana   PCP: [facility]   Recommendations at discharge:   Follow-up with PCP in 1 week. Follow-up with neurology in 1 month. Follow-up pituitary mass. Discontinue aspirin  in 3 days, start Eliquis  12/20 if there is no neurological symptoms again.  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 *  Hospital Course: Ricky Leonard is a 43 y.o. 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  Comorbidities include hypertension, obesity and chronic tobacco dependence.  He presented to the emergency room today after experiencing left eye blurry vision with black floaters. MRI of the brain showed a right cerebellar infarct.  CT angiogram neck and head did not show any significant occlusion. Patient was seen by neurology.  Also evaluate by ophthalmology with a possible left vitreous hemorrhage, which she was not supported.  Ophthalmology has cleared the patient for anticoagulation.  Neurology prefer patient start anticoagulation in 3 days.  I have ordered Eliquis  5 mg twice a day without higher dose, starting on 12/20.  Continue aspirin  and discontinue at that time when Eliquis  started. Patient also has LDL of 101, statin increasing dose. Patient will need to follow-up with PCP and neurology. I also discussed with TOC, she was set up ophthalmology follow-up for patient.         Consultants:  Neurology and ophthalmology. Procedures performed: None  Disposition: Home Diet recommendation:  Discharge Diet Orders (From admission, onward)     Start     Ordered   06/28/24 0000  Diet - low sodium heart healthy        06/28/24 1337           Cardiac diet DISCHARGE MEDICATION: Allergies as of 06/28/2024       Reactions   Dust Mite Extract Cough   Pollen Extract Other (See Comments)   Seasonal allergies, sneezing, runny nose, itchy eyes        Medication List     STOP taking these medications    acetaminophen  500 MG tablet Commonly known as: TYLENOL    buPROPion 100 MG tablet Commonly known as: WELLBUTRIN   cyclobenzaprine  5 MG tablet Commonly known as: FLEXERIL    hydrochlorothiazide  25 MG tablet Commonly known as: HYDRODIURIL    losartan  25 MG tablet Commonly known as: COZAAR        TAKE these medications    albuterol  108 (90 Base) MCG/ACT inhaler Commonly known as: VENTOLIN  HFA Inhale 2-4 puffs by mouth every 4 hours as needed for wheezing, cough, and/or shortness of breath   amLODipine 10 MG tablet Commonly known as: NORVASC Take 10 mg by mouth daily.   apixaban  5 MG Tabs tablet Commonly known as: ELIQUIS  Take 1 tablet (5 mg total) by mouth 2 (two) times daily. Start taking on: July 02, 2024   aspirin  EC 81 MG tablet Take 1 tablet (81 mg total) by mouth daily for 3 days. Swallow whole. Start taking on: June 29, 2024 What changed:  medication strength  how much to take when to take this reasons to take this additional instructions   atorvastatin  80 MG tablet Commonly known as: LIPITOR Take 1 tablet (80 mg total) by mouth daily. Start taking on: June 29, 2024 What changed:  medication strength how much to take   carvedilol  12.5 MG tablet Commonly known as: COREG  Take 1 tablet (12.5 mg total) by mouth 2 (two) times daily with a meal.   losartan -hydrochlorothiazide  100-25 MG tablet Commonly known as: HYZAAR Take 1  tablet by mouth daily.        Follow-up Information     [facility] Follow up in 1 week(s).   Contact information: [address] [place] [address] [phone]         [facility] Follow up in 1 month(s).   Contact information: [address] [place]  [address] [phone]               Discharge Exam: Fredricka Weights   06/27/24 1940  Weight: 101.4 kg   General exam: Appears calm and comfortable  Respiratory system: Clear to auscultation. Respiratory effort normal. Cardiovascular system: S1 & S2 heard, RRR. No JVD, murmurs, rubs, gallops or clicks. No pedal edema. Gastrointestinal system: Abdomen is nondistended, soft and nontender. No organomegaly or masses felt. Normal bowel sounds heard. Central nervous system: Alert and oriented.  Right eye blindness. Extremities: Symmetric 5 x 5 power. Skin: No rashes, lesions or ulcers Psychiatry: Judgement and insight appear normal. Mood & affect appropriate.    Condition at discharge: good  The results of significant diagnostics from this hospitalization (including imaging, microbiology, ancillary and laboratory) are listed below for reference.   Imaging Studies: CT ANGIO HEAD NECK W WO CM Result Date: 06/28/2024 EXAM: CTA HEAD AND NECK WITHOUT AND WITH 06/28/2024 12:07:18 PM TECHNIQUE: CTA of the head and neck was performed without and with the administration of 75 mL of iohexol  (OMNIPAQUE ) 350 MG/ML injection. Multiplanar 2D and/or 3D reformatted images are provided for review. Automated exposure control, iterative reconstruction, and/or weight based adjustment of the mA/kV was utilized to reduce the radiation dose to as low as reasonably achievable. Stenosis of the internal carotid arteries measured using NASCET criteria. COMPARISON: MRI of the head dated 06/27/2024. CLINICAL HISTORY: Neuro deficit, acute, stroke suspected; incidental cerebellar  stroke on MRI. FINDINGS: CTA NECK: AORTIC ARCH AND ARCH VESSELS: No dissection or arterial injury. No significant stenosis of the brachiocephalic or subclavian arteries. CERVICAL CAROTID ARTERIES: Minimal calcific plaque present within the origins of the internal carotid arteries bilaterally, with 0% stenosis bilaterally. No dissection or arterial injury. CERVICAL VERTEBRAL ARTERIES: The left vertebral artery is dominant. The vertebral arteries are normal in caliber. No dissection, arterial injury, or significant stenosis. LUNGS AND MEDIASTINUM: Unremarkable. SOFT TISSUES: No acute abnormality. BONES: No acute abnormality. CTA HEAD: ANTERIOR CIRCULATION: No significant stenosis of the internal carotid arteries. No significant stenosis of the anterior cerebral arteries. No significant stenosis of the middle cerebral arteries. No aneurysm. POSTERIOR CIRCULATION: No significant stenosis of the posterior cerebral arteries. No significant stenosis of the basilar artery. No significant stenosis of the vertebral arteries. No aneurysm. OTHER: Age-related atrophy and mild-to-moderate cerebral white matter disease. There are chronic lacunar infarcts present within the thalami bilaterally. The acute nonhemorrhagic infarct present medially within the right cerebellar hemisphere is not readily apparent on the current study. No dural venous sinus thrombosis on this non-dedicated study. IMPRESSION: 1. No large vessel occlusion, hemodynamically significant stenosis, or aneurysm  in the head or neck. 2. Acute nonhemorrhagic infarct in the right cerebellar hemisphere, not readily apparent on the current CTA. Electronically signed by: Evalene Coho MD 06/28/2024 12:22 PM EST RP Workstation: [id number]   ECHOCARDIOGRAM COMPLETE Result Date: 06/28/2024    ECHOCARDIOGRAM REPORT   Patient Name:   NAVARRO NINE [facility] Date of Exam: 06/28/2024 Medical Rec #:  [id number]      Height:       70.0 in Accession #:    [id number]     Weight:        223.5 lb Date of Birth:  [birth year]      BSA:          2.188 m Patient Age:    43 years       BP:           162/113 mmHg Patient Gender: M              HR:           67 bpm. Exam Location:  [facility] Procedure: 2D Echo, Cardiac Doppler, Color Doppler and Intracardiac            Opacification Agent (Both Spectral and Color Flow Doppler were            utilized during procedure). Indications:     Stroke  History:         Patient has no prior history of Echocardiogram examinations.  Sonographer:     Philomena Daring Referring Phys:  [id number] MAUDE MARLA DART Diagnosing Phys: Lonni Hanson MD IMPRESSIONS  1. Left ventricular ejection fraction, by estimation, is 65 to 70%. The left ventricle has normal function. The left ventricle has no regional wall motion abnormalities. There is mild left ventricular hypertrophy. Left ventricular diastolic parameters were normal.  2. Right ventricular systolic function is normal. The right ventricular size is normal. There is normal pulmonary artery systolic pressure.  3. The mitral valve is normal in structure. No evidence of mitral valve regurgitation. No evidence of mitral stenosis.  4. The aortic valve was not well visualized. Aortic valve regurgitation is not visualized. No aortic stenosis is present.  5. The inferior vena cava is normal in size with greater than 50% respiratory variability, suggesting right atrial pressure of 3 mmHg. FINDINGS  Left Ventricle: Left ventricular ejection fraction, by estimation, is 65 to 70%. The left ventricle has normal function. The left ventricle has no regional wall motion abnormalities. Definity  contrast agent was given IV to delineate the left ventricular  endocardial borders. The left ventricular internal cavity size was normal in size. There is mild left ventricular hypertrophy. Left ventricular diastolic parameters were normal. Right Ventricle: The right ventricular size is normal. No increase in right ventricular wall thickness. Right  ventricular systolic function is normal. There is normal pulmonary artery systolic pressure. The tricuspid regurgitant velocity is 1.89 m/s, and  with an assumed right atrial pressure of 3 mmHg, the estimated right ventricular systolic pressure is 17.3 mmHg. Left Atrium: Left atrial size was normal in size. Right Atrium: Right atrial size was normal in size. Pericardium: There is no evidence of pericardial effusion. Mitral Valve: The mitral valve is normal in structure. No evidence of mitral valve regurgitation. No evidence of mitral valve stenosis. Tricuspid Valve: The tricuspid valve is not well visualized. Tricuspid valve regurgitation is trivial. Aortic Valve: The aortic valve was not well visualized. Aortic valve regurgitation is not visualized. No aortic stenosis is present. Pulmonic Valve: The pulmonic valve was not well  visualized. Pulmonic valve regurgitation is not visualized. No evidence of pulmonic stenosis. Aorta: The aortic root and ascending aorta are structurally normal, with no evidence of dilitation. Pulmonary Artery: The pulmonary artery is of normal size. Venous: The inferior vena cava is normal in size with greater than 50% respiratory variability, suggesting right atrial pressure of 3 mmHg. IAS/Shunts: No atrial level shunt detected by color flow Doppler.  LEFT VENTRICLE PLAX 2D LVIDd:         4.50 cm   Diastology LVIDs:         2.90 cm   LV e' medial:    7.18 cm/s LV PW:         1.20 cm   LV E/e' medial:  12.3 LV IVS:        1.30 cm   LV e' lateral:   10.30 cm/s LVOT diam:     2.10 cm   LV E/e' lateral: 8.5 LV SV:         87 LV SV Index:   40 LVOT Area:     3.46 cm  RIGHT VENTRICLE             IVC RV S prime:     14.30 cm/s  IVC diam: 1.30 cm TAPSE (M-mode): 2.5 cm LEFT ATRIUM             Index        RIGHT ATRIUM           Index LA diam:        3.30 cm 1.51 cm/m   RA Area:     12.50 cm LA Vol (A2C):   40.9 ml 18.69 ml/m  RA Volume:   27.30 ml  12.47 ml/m LA Vol (A4C):   28.7 ml 13.11  ml/m LA Biplane Vol: 34.5 ml 15.76 ml/m  AORTIC VALVE LVOT Vmax:   105.00 cm/s LVOT Vmean:  65.000 cm/s LVOT VTI:    0.251 m  AORTA Ao Root diam: 3.00 cm MITRAL VALVE               TRICUSPID VALVE MV Area (PHT): 3.70 cm    TR Peak grad:   14.3 mmHg MV Decel Time: 205 msec    TR Vmax:        189.00 cm/s MV E velocity: 88.00 cm/s MV A velocity: 73.90 cm/s  SHUNTS MV E/A ratio:  1.19        Systemic VTI:  0.25 m                            Systemic Diam: 2.10 cm Lonni Hanson MD Electronically signed by Lonni Hanson MD Signature Date/Time: 06/28/2024/10:32:43 AM    Final    MR Brain W and Wo Contrast Result Date: 06/27/2024 EXAM: MRI BRAIN WITH AND WITHOUT CONTRAST 06/27/2024 05:25:10 PM TECHNIQUE: Multiplanar multisequence MRI of the head/brain was performed with and without the administration of 7.5 mL of gadobutrol  (GADAVIST ) 1 MMOL/ML intravenous contrast. COMPARISON: Compared to the prior CT from earlier the same day. CLINICAL HISTORY: New blurry vision on the left, CT shows possible pituitary mass. Please do pituitary protocol. FINDINGS: BRAIN AND VENTRICLES: A 1.5 cm acute ischemic nonhemorrhagic infarct is seen involving the right cerebellum (series 5, image 14). No acute intracranial hemorrhage. No significant mass effect or midline shift. No hydrocephalus. Dedicated pituitary images were performed. Hypoenhancing suprasellar mass measuring 2.0 x 1.2 x 1.3 cm, most characteristic of a pituitary macroadenoma.  Suprasellar extension with the superior aspect of the lesion abuts the undersurface of the optic chiasm. Pituitary stalk is deviated to the left. Borderline cavernous sinus extension on the right (series 21, image 6). Normal flow voids. Mild age-related cerebral atrophy. Patchy and confluent T2/FLAIR hyperintensity involving the periventricular and deep white matter of the hemispheres, consistent with chronic subcortical ischemic disease, advanced for age. Multiple superimposed remote lacunar  infarcts present about the bilateral basal ganglia and thalami. Few scattered small remote bilateral cerebellar infarcts present. Scattered associated chronic hemosiderin staining noted. Few additional scattered chronic microhemorrhages noted, likely hypertensive in nature. ORBITS: Abnormal FLAIR signal intensity seen involving the posterior aspect of the right globe (series 9, image 19), indeterminate. SINUSES: Left maxillary sinus retention cyst noted. BONES AND SOFT TISSUES: Normal bone marrow signal and enhancement. 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 IMPRESSION: 1. 1.5 cm acute ischemic nonhemorrhagic right cerebellar infarct. 2. 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 3. Advanced chronic microvascular ischemic disease for age. 4. Age determinant abnormal FLAIR signal involving the posterior aspect of the right globe, possibly reflecting hemorrhage and/or changes of retinal pathology. Correlation with physical exam recommended. Electronically signed by: Morene Hoard MD 06/27/2024 06:06 PM EST RP Workstation: [id number]   CT HEAD CODE STROKE WO CONTRAST (LKW 0-4.5h, LVO 0-24h) Result Date: 06/27/2024 EXAM: CT HEAD WITHOUT CONTRAST 06/27/2024 12:15:40 PM TECHNIQUE: CT of the head was performed without the administration of intravenous contrast. Automated exposure control, iterative reconstruction, and/or weight based adjustment of the mA/kV was utilized to reduce the radiation dose to as low as reasonably achievable. COMPARISON: None available. CLINICAL HISTORY: 43 year old male with acute neurological deficit, stroke suspected. FINDINGS: BRAIN AND VENTRICLES: Brain volume within normal limits. No acute hemorrhage. No evidence of acute infarct. Patchy and confluent bilaterally periventricular white matter hypodensity. Small but circumscribed  and chronic appearing lacunar infarct in the right thalamus (series 3 image 17). Contralateral left thalamus more indeterminate appearing lacunar infarct (same image). Posterior fossa gray white differentiation within normal limits. Calcified atherosclerosis at the skull base. Abnormally enlarged but nonspecific pituitary or sella turcica soft tissue, up to 12 mm craniocaudal (series 6 image 30). Recommend endocrine function tests, follow up pituitary MRI. No suspicious intracranial vascular hyperdensity. No hydrocephalus. No extra-axial collection. No mass effect or midline shift. ORBITS: Mild rightward gaze. SINUSES: Left maxillary alveolar recess retention cyst. Other paranasal sinuses, tympanic cavities and mastoids are well aerated. SOFT TISSUES AND SKULL: No acute soft tissue abnormality. No skull fracture. ALBERTA STROKE PROGRAM EARLY CT SCORE (ASPECTS): Ganglionic (caudate, IC, lentiform nucleus, insula, M1-M3): 7 Supraganglionic (M4-M6): 3 Total: 10 IMPRESSION: 1. No acute cortically based infarct or intracranial hemorrhage identified. ASPECTS 10. 2. Advanced chronic small vessel disease, including age-related changes involvement of the left thalamus. 3. Nonspecific pituitary / soft tissue enlargement. Recommend endocrine function tests, follow up pituitary MRI. 4. These results were communicated to Dr Matthews at 12:22 on 06/27/2024 by text page via the [facility] messaging system. Electronically signed by: Helayne Hurst MD 06/27/2024 12:23 PM EST RP Workstation: [id number]    Microbiology: Results for orders placed or performed during the hospital encounter of 10/15/23  Resp panel by RT-PCR (RSV, Flu A&B, Covid) Anterior Nasal Swab     Status: None   Collection Time: 10/15/23  8:04 AM   Specimen: Anterior Nasal Swab  Result Value Ref Range Status   SARS Coronavirus  2 by RT PCR NEGATIVE NEGATIVE Final    Comment: (NOTE) SARS-CoV-2 target nucleic acids are NOT DETECTED.  The SARS-CoV-2 RNA is generally  detectable in upper respiratory specimens during the acute phase of infection. The lowest concentration of SARS-CoV-2 viral copies this assay can detect is 138 copies/mL. A negative result does not preclude SARS-Cov-2 infection and should not be used as the sole basis for treatment or other patient management decisions. A negative result may occur with  improper specimen collection/handling, submission of specimen other than nasopharyngeal swab, presence of viral mutation(s) within the areas targeted by this assay, and inadequate number of viral copies(<138 copies/mL). A negative result must be combined with clinical observations, patient history, and epidemiological information. The expected result is Negative.  Fact Sheet for Patients:  bloggercourse.com  Fact Sheet for Healthcare Providers:  seriousbroker.it  This test is no t yet approved or cleared by the [place]  FDA and  has been authorized for detection and/or diagnosis of SARS-CoV-2 by FDA under an Emergency Use Authorization (EUA). This EUA will remain  in effect (meaning this test can be used) for the duration of the COVID-19 declaration under Section 564(b)(1) of the Act, 21 U.S.C.section 360bbb-3(b)(1), unless the authorization is terminated  or revoked sooner.       Influenza A by PCR NEGATIVE NEGATIVE Final   Influenza B by PCR NEGATIVE NEGATIVE Final    Comment: (NOTE) The Xpert Xpress SARS-CoV-2/FLU/RSV plus assay is intended as an aid in the diagnosis of influenza from Nasopharyngeal swab specimens and should not be used as a sole basis for treatment. Nasal washings and aspirates are unacceptable for Xpert Xpress SARS-CoV-2/FLU/RSV testing.  Fact Sheet for Patients: bloggercourse.com  Fact Sheet for Healthcare Providers: seriousbroker.it  This test is not yet approved or cleared by the [place]  FDA  and has been authorized for detection and/or diagnosis of SARS-CoV-2 by FDA under an Emergency Use Authorization (EUA). This EUA will remain in effect (meaning this test can be used) for the duration of the COVID-19 declaration under Section 564(b)(1) of the Act, 21 U.S.C. section 360bbb-3(b)(1), unless the authorization is terminated or revoked.     Resp Syncytial Virus by PCR NEGATIVE NEGATIVE Final    Comment: (NOTE) Fact Sheet for Patients: bloggercourse.com  Fact Sheet for Healthcare Providers: seriousbroker.it  This test is not yet approved or cleared by the [place]  FDA and has been authorized for detection and/or diagnosis of SARS-CoV-2 by FDA under an Emergency Use Authorization (EUA). This EUA will remain in effect (meaning this test can be used) for the duration of the COVID-19 declaration under Section 564(b)(1) of the Act, 21 U.S.C. section 360bbb-3(b)(1), unless the authorization is terminated or revoked.  Performed at [facility], [address] [place], [place] [address]     Labs: CBC: Recent Labs  Lab 06/27/24 1205  WBC 5.0  NEUTROABS 2.2  HGB 15.1  HCT 44.6  MCV 92.9  PLT 239   Basic Metabolic Panel: Recent Labs  Lab 06/27/24 1205  NA 136  K 3.8  CL 99  CO2 28  GLUCOSE 101*  BUN 16  CREATININE 1.05  CALCIUM  9.9   Liver Function Tests: Recent Labs  Lab 06/27/24 1205  AST 20  ALT 29  ALKPHOS 67  BILITOT 0.6  PROT 6.9  ALBUMIN 4.3   CBG: Recent Labs  Lab 06/27/24 1158  GLUCAP 117*    Discharge time spent: 35 minutes.  Signed: Murvin Mana, MD [facility] 06/28/2024

## 2024-06-28 NOTE — Progress Notes (Signed)
 SLP Cancellation Note  Patient Details Name: Ricky Leonard MRN: [id number] DOB: 04-10-1981   Cancelled treatment:       Reason Eval/Treat Not Completed: SLP screened, no needs identified, will sign off (chart reviewed; consulted NSG then met w/ pt and family in room.)   Pt is a 43 y.o. 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  Comorbidities include hypertension, obesity and chronic tobacco dependence.  He presented to the emergency room today after experiencing left eye blurry vision with black floaters.  Symptoms were consistent with his prior central retinal disease hence decided to come to the emergency room immediately.  MRI subsequently obtained per recommendations of Neurology revealed new 1.5 cm acute ischemic nonhemorrhagic right cerebellar infarct.   Pt denied any difficulty swallowing and is currently on a regular diet; tolerates swallowing pills w/ water per NSG. Pt conversed in full conversation w/out expressive/receptive deficits noted; pt denied any speech-language deficits. Speech clear. No further skilled ST services indicated as pt appears at his baseline. Pt agreed. NSG to reconsult if any change in status while admitted.      Comer Portugal, MS, CCC-SLP Speech Language Pathologist Rehab Services; [facility]: [facility] [phone] (ascom) Kajuana Shareef 06/28/2024, 4:07 PM

## 2024-06-28 NOTE — Progress Notes (Signed)
 NEUROLOGY CONSULT FOLLOW UP NOTE   Date of service: June 28, 2024 Patient Name: Ricky Leonard MRN:  [id number] DOB:  01/24/1981  Interval Hx/subjective   *** Vitals   Vitals:   06/28/24 0730 06/28/24 0745 06/28/24 0835 06/28/24 0914  BP: (!) 162/113  (!) 155/103   Pulse: (!) 55  60   Resp: (!) 22 15 15    Temp:    97.9 F (36.6 C)  TempSrc:    Oral  SpO2: (!) 89% 94% 93%   Weight:      Height:         Body mass index is 32.08 kg/m.  Physical Exam   Constitutional: Appears well-developed and well-nourished. *** Psych: Affect appropriate to situation. *** Eyes: No scleral injection. *** HENT: No OP obstrucion. *** Head: Normocephalic. *** Cardiovascular: Normal rate and regular rhythm. *** Respiratory: Effort normal, non-labored breathing. *** GI: Soft.  No distension. There is no tenderness. *** Skin: WDI. ***  Neurologic Examination   ***  Medications Current Medications[1]  Labs and Diagnostic Imaging   CBC:  Recent Labs  Lab 06/27/24 1205  WBC 5.0  NEUTROABS 2.2  HGB 15.1  HCT 44.6  MCV 92.9  PLT 239    Basic Metabolic Panel:  Lab Results  Component Value Date   NA 136 06/27/2024   K 3.8 06/27/2024   CO2 28 06/27/2024   GLUCOSE 101 (H) 06/27/2024   BUN 16 06/27/2024   CREATININE 1.05 06/27/2024   CALCIUM  9.9 06/27/2024   GFRNONAA >60 06/27/2024   GFRAA >60 04/16/2019   Lipid Panel:  Lab Results  Component Value Date   LDLCALC 104 (H) 06/28/2024   HgbA1c:  Lab Results  Component Value Date   HGBA1C 5.0 06/27/2024   Urine Drug Screen:     Component Value Date/Time   LABOPIA NEGATIVE 06/27/2024 2313   COCAINSCRNUR NEGATIVE 06/27/2024 2313   COCAINSCRNUR NONE DETECTED 06/14/2016 0012   LABBENZ NEGATIVE 06/27/2024 2313   AMPHETMU NEGATIVE 06/27/2024 2313   THCU POSITIVE (A) 06/27/2024 2313   LABBARB NEGATIVE 06/27/2024 2313    Alcohol Level No results found for: [facility] INR  Lab Results  Component Value Date   INR 0.9  06/27/2024   APTT  Lab Results  Component Value Date   APTT 27 06/27/2024   AED levels: No results found for: PHENYTOIN, ZONISAMIDE, LAMOTRIGINE, LEVETIRACETA  CT Head without contrast(Personally reviewed): ***  CT angio Head and Neck with contrast(Personally reviewed): ***  MR Angio head without contrast and Carotid Duplex BL(Personally reviewed): ***  MRI Brain(Personally reviewed): ***  rEEG:  ***  Assessment   Ricky Leonard is a 43 y.o. male ***  OK to start eliquis  day 3  Recommendations   - F/u CTA H&N - F/u TTE - If above studies show no significant abnormalities OK to discharge patient this afternoon with the following instructions:  *** - If you have any new or worsening eye or brain problems like worsening visual disturbance, severe dizziness, trouble walking, trouble talking, or weakness or numbness on one side THEN GO BACK TO ER  Otherwise if you are stable: - Take one baby aspirin  (81mg ) tomorrow morning - Then start eliquis  5mg  twice daily on Thursday morning and continue on this dose. Do not take any more aspirin  after you start eliquis  - Do not drive until you are cleared to do so by outpatient ophthalmology or neurology ***  - TOC contacted to assist in finding outpatient ophtho provider that takes medicaid -  I will arrange outpatient neurology f/u  ______________________________________________________________________   Signed, Elida CHRISTELLA Ross, MD [facility]     [1]  Current Facility-Administered Medications:    0.9 %  sodium chloride  infusion, , Intravenous, Continuous, Acheampong, Maude POUR, MD, Last Rate: 40 mL/hr at 06/28/24 0714, Infusion Verify at 06/28/24 9285   acetaminophen  (TYLENOL ) tablet 650 mg, 650 mg, Oral, Q4H PRN **OR** acetaminophen  (TYLENOL ) 160 MG/5ML solution 650 mg, 650 mg, Per Tube, Q4H PRN **OR** acetaminophen  (TYLENOL ) suppository 650 mg, 650 mg, Rectal, Q4H PRN, Acheampong, Maude POUR, MD    albuterol  (PROVENTIL ) (2.5 MG/3ML) 0.083% nebulizer solution 2.5 mg, 2.5 mg, Inhalation, Q6H PRN, Acheampong, Peter K, MD   aspirin  EC tablet 81 mg, 81 mg, Oral, Daily, Acheampong, Peter K, MD, 81 mg at 06/28/24 9063   atorvastatin  (LIPITOR) tablet 80 mg, 80 mg, Oral, Daily, Acheampong, Peter K, MD, 80 mg at 06/28/24 0936   senna-docusate (Senokot-S) tablet 1 tablet, 1 tablet, Oral, QHS PRN, Acheampong, Maude POUR, MD  Current Outpatient Medications:    albuterol  (VENTOLIN  HFA) 108 (90 Base) MCG/ACT inhaler, Inhale 2-4 puffs by mouth every 4 hours as needed for wheezing, cough, and/or shortness of breath, Disp: 8 g, Rfl: 0   amLODipine (NORVASC) 10 MG tablet, Take 10 mg by mouth daily., Disp: , Rfl:    atorvastatin  (LIPITOR) 40 MG tablet, Take 40 mg by mouth daily., Disp: , Rfl:    carvedilol  (COREG ) 12.5 MG tablet, Take 1 tablet (12.5 mg total) by mouth 2 (two) times daily with a meal., Disp: 60 tablet, Rfl: 2   losartan -hydrochlorothiazide  (HYZAAR) 100-25 MG tablet, Take 1 tablet by mouth daily., Disp: , Rfl:    acetaminophen  (TYLENOL ) 500 MG tablet, Take 1 tablet by mouth every 6 (six) hours as needed. (Patient not taking: Reported on 06/27/2024), Disp: , Rfl:    aspirin  EC 325 MG tablet, Take 1 tablet (325 mg total) by mouth every 6 (six) hours as needed for mild pain or moderate pain. (Patient not taking: Reported on 06/27/2024), Disp: 30 tablet, Rfl: 0   buPROPion (WELLBUTRIN) 100 MG tablet, Take 100 mg by mouth 2 (two) times a day. (Patient not taking: Reported on 06/27/2024), Disp: , Rfl:    cyclobenzaprine  (FLEXERIL ) 5 MG tablet, Take 1 tablet (5 mg total) by mouth 3 (three) times daily as needed. (Patient not taking: Reported on 06/27/2024), Disp: 15 tablet, Rfl: 0   hydrochlorothiazide  (HYDRODIURIL ) 25 MG tablet, Take 1 tablet (25 mg total) by mouth daily. (Patient not taking: Reported on 06/27/2024), Disp: 30 tablet, Rfl: 0   losartan  (COZAAR ) 25 MG tablet, Take 25 mg by mouth at bedtime.  (Patient not taking: Reported on 06/27/2024), Disp: , Rfl:

## 2024-06-28 NOTE — ED Notes (Signed)
 Patient's BP steadily increasing over morning. Patient states that he normally takes BP home medications. These medications were reviewed by pharmacy tech and RN. Laurita, MD alerted to situation and patient requested if home medications could be ordered.

## 2024-06-28 NOTE — TOC Initial Note (Addendum)
 Transition of Care [facility]) - Initial/Assessment Note    Patient Details  Name: Ricky Leonard MRN: [id number] Date of Birth: Jun 26, 1981  [facility] CM/SW Contact:    Victory Jackquline RAMAN, RN Phone Number: 06/28/2024, 2:12 PM  Clinical Narrative:  RNCM met with patient and his mom at the bedside, introduce myself and my role. Advised that RNCM received a consult to speak to him about finding an eye doctor. RNCM advised patient to call his insurance provider to get a list of eye doctors that will accept his insurance. Patient verbalized understanding. Advised to call ASAP secondary to hi already having a detached retina that went untreated and has lost most of his site in the right eye because he wasn't aware that there was treatment/surgery for a detached retina. Patient is independent at home and has a cane at home from a previous back injury but doesn't use it anymore. RNCM will continue to follow for any discharge needs. No other TOC needs at this time.           Expected Discharge Plan: Home/Self Care Barriers to Discharge: Continued Medical Work up   Patient Goals and CMS Choice            Expected Discharge Plan and Services       Living arrangements for the past 2 months: Single Family Home Expected Discharge Date: 06/28/24                                    Prior Living Arrangements/Services Living arrangements for the past 2 months: Single Family Home Lives with:: Self Patient language and need for interpreter reviewed:: Yes Do you feel safe going back to the place where you live?: Yes      Need for Family Participation in Patient Care: Yes (Comment) Care giver support system in place?: Yes (comment) Current home services: DME Criminal Activity/Legal Involvement Pertinent to Current Situation/Hospitalization: No - Comment as needed  Activities of Daily Living      Permission Sought/Granted                  Emotional  Assessment Appearance:: Appears stated age, Well-Groomed Attitude/Demeanor/Rapport: Engaged, Self-Confident, Gracious Affect (typically observed): Calm, Accepting, Adaptable, Pleasant, Quiet Orientation: : Oriented to Self, Oriented to Place, Oriented to  Time, Oriented to Situation Alcohol / Substance Use: Not Applicable Psych Involvement: No (comment)  Admission diagnosis:  Stroke [facility]) [I63.9] Patient Active Problem List   Diagnosis Date Noted   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 ([address]), [place] - [address] [place]) [place] [address] Phone: [phone] Fax: [phone]  [facility]  [address] [place] [address] Phone: [phone] Fax: [phone]  [facility] [place], [place] - [address] [place] [address] Phone: [phone] Fax: [phone]  [facility] - [place], [place] - [address] [place] [address] Phone: [phone] Fax:  [phone]  [facility] - [facility] [address] [place] [address] Phone: [phone] Fax: [phone]     Social Drivers of Health (SDOH) Social History: SDOH Screenings   Tobacco Use: High Risk (06/27/2024)   SDOH Interventions:     Readmission Risk Interventions     No data to display

## 2024-06-28 NOTE — Evaluation (Signed)
 Occupational Therapy Evaluation Patient Details Name: Ricky Leonard MRN: [id number] DOB: May 17, 1981 Today's Date: 06/28/2024   History of Present Illness   Pt is a 43 y.o. male presented to ED with blurred vision/black floaters to L eye. MRI showed acute right cerebellar infarct. Ophthalmology diagnosed pt with possible vitreous hemorrhage. 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     Clinical Impressions Pt admitted with above. Prior to admission, pt was independent including working and driving. Pt with chronic R eye blindness (able to see shadows/large shapes) and states he is at his functional baseline with the exception of L eye blurriness with black floaters present. BUE ROM, coordination, sensation & strength WNL with no focal deficits appreciated. Pt able to complete transfers and mobility to / from hallway bathroom for toileting needs while pushing IV pole independently, no LOB while reaching outside BOS and picking up items from floor. Recommended that pt receive medical clearance from MD prior to resuming driving with new visual deficits, pt verbalizes understanding. Pt would benefit from skilled OT services to address noted impairments and functional limitations (see below for any additional details) in order to maximize safety and independence while minimizing falls risk and caregiver burden. Anticipate the need for follow up outpatient OT services with focus on higher level visual strategies upon acute hospital DC.      If plan is discharge home, recommend the following:   Assist for transportation     Functional Status Assessment   Patient has had a recent decline in their functional status and demonstrates the ability to make significant improvements in function in a reasonable and predictable amount of time.     Equipment  Recommendations   None recommended by OT      Precautions/Restrictions   Precautions Precautions: None Restrictions Weight Bearing Restrictions Per Provider Order: No     Mobility Bed Mobility Overal bed mobility: Independent                  Transfers Overall transfer level: Independent Equipment used: None                      Balance Overall balance assessment: Independent                                         ADL either performed or assessed with clinical judgement   ADL Overall ADL's : Independent                                             Vision Baseline Vision/History: 2 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 Pt able to read name bade and sign on wall.) Vision Assessment?: Yes;Vision impaired- to be further tested in functional context Eye Alignment: Impaired (comment) Ocular Range of Motion: Restricted on the right (R eye with baseline blindness) Tracking/Visual Pursuits: Other (comment) (decreased tracking R eye with blindness baseline)            Pertinent Vitals/Pain Pain Assessment Pain Assessment:  No/denies pain     Extremity/Trunk Assessment Upper Extremity Assessment Upper Extremity Assessment: Overall WFL for tasks assessed;Right hand dominant (no focal deficits, WNL coordination, finger-to-nose and strength)   Lower Extremity Assessment Lower Extremity Assessment: Overall WFL for tasks assessed   Cervical / Trunk Assessment Cervical / Trunk Assessment: Normal   Communication Communication Communication: No apparent difficulties   Cognition Arousal: Alert Behavior During Therapy: WFL for tasks assessed/performed Cognition: No apparent impairments                                Following commands: Intact       Cueing  General Comments   Cueing Techniques: Verbal cues  BP elevated (permissive HTN per chart), other VSS on RA WNL   Exercises Exercises: Other exercises Other Exercises Other Exercises: edu on role/purpose of OT, recommended pt recieve medical clearance prior to returning to driving Other Exercises: edu on vision therapy and f/u with outpatient if necessary   Shoulder Instructions      Home Living Family/patient expects to be discharged to:: Private residence Living Arrangements: Non-relatives/Friends Available Help at Discharge: Available PRN/intermittently Type of Home: House Home Access: Stairs to enter Secretary/administrator of Steps: 2   Home Layout: One level     Bathroom Shower/Tub: Tub/shower unit         Home Equipment: None          Prior Functioning/Environment Prior Level of Function : Independent/Modified Independent;Driving;Working/employed                    OT Problem List: Impaired vision/perception   OT Treatment/Interventions: Neuromuscular education;Visual/perceptual remediation/compensation;Patient/family education      OT Goals(Current goals can be found in the care plan section)   Acute Rehab OT Goals OT Goal Formulation: With patient Time For Goal Achievement: 07/12/24 Potential to Achieve Goals: Good   OT Frequency:  Min 1X/week       AM-PAC OT 6 Clicks Daily Activity     Outcome Measure Help from another person eating meals?: None Help from another person taking care of personal grooming?: None Help from another person toileting, which includes using toliet, bedpan, or urinal?: None Help from another person bathing (including washing, rinsing, drying)?: None Help from another person to put on and taking off regular upper body clothing?: None Help from another person to put on and taking off regular lower body clothing?: None 6 Click Score: 24   End of Session Nurse  Communication: Mobility status  Activity Tolerance: Patient tolerated treatment well;No increased pain Patient left: in bed;with call bell/phone within reach;with bed alarm set  OT Visit Diagnosis: Other symptoms and signs involving the nervous system (R29.898);Low vision, both eyes (H54.2)                Time: 9040-8975 OT Time Calculation (min): 25 min Charges:  OT General Charges $OT Visit: 1 Visit OT Evaluation $OT Eval Low Complexity: 1 Low OT Treatments $Self Care/Home Management : 8-22 mins  Jonh Mcqueary L. Siara Gorder, OTR/L  06/28/2024, 2:06 PM

## 2024-06-29 ENCOUNTER — Other Ambulatory Visit: Payer: Self-pay

## 2024-07-05 ENCOUNTER — Encounter: Payer: Self-pay | Admitting: Neurology

## 2024-07-19 NOTE — Progress Notes (Signed)
 "  NEUROLOGY CONSULTATION NOTE  Ricky Leonard MRN: [id number] DOB: [birth year]  Referring provider: Clotilda Punter, MD Primary care provider: [facility]  Reason for consult:  stroke  Assessment/Plan:   Right cerebellar infarct, incidental finding, likely secondary to small vessel disease Right vitreous hemorrhage Prothrombin gene mutation Hypertension Cigarette smoker Hyperlipidemia Possible OSA   From a secondary stroke prevention standpoint, Eliquis  is not indicated as his stroke appears to be secondary to small vessel disease, which is typically treated with antiplatelet therapy.  However, if he is going to be treated with Eliquis  for the prothrombin gene mutation, I would hold off on ASA. Other secondary stroke prevention as managed by PCP: Normotensive blood pressure Statin.  LDL goal less than 70 Hgb J8r goal less than 7 Lifestyle modification: Smoking cessation Mediterranean diet Routine exercise Proceed with workup for OSA Follow up in 6 months.   Total time spent on today's visit was 48 minutes dedicated to this patient today, preparing to see patient, examining the patient, ordering tests and/or medications and counseling the patient, documenting clinical information in the EHR or other health record, independently interpreting results and communicating results to the patient/family, discussing treatment and goals, answering patient's questions and coordinating care.     Subjective:  Ricky Leonard is a 44 year old right-handed male with HTN and prothrombin gene mutation with history of DVT who presents for stroke.  History supplemented by hospital records.  On 06/27/2024, he developed black vision in his left eye, similar to when he previously had vitreous hemorrhage in his right eye.  No associated pain or trauma.  No associated facial droop, slurred speech, language disturbance or unilateral numbness or weakness, vertigo or ataxia.  He presented to  the ED.  He has history of prothrombin gene mutation with history of bilateral CRVO in 2022, right retinal detachment, left retinal artery occlusion, and retinal ischemia.  He has baseline vision loss in his right eye.  He also has history of unprovoked left subclavian vein thrombus in 2017 for which he was on Eliquis  until 2018.  Since then he was supposed to be taking ASA but had been noncompliant.  In the ED, CT head showed advanced chronic small vessel disease but no acute findings.  However, follow up MRI of brain revealed a 1.5 cm right cerebellar nonhemorrhagic infarct as well as findings suggestive of possible hemorrhage of the posterior eye as well as a pituitary mass.  CTA head and neck revealed no LVO or hemodynamically significant stenosis.  Due to potential vitreous hemorrhage, he was not a TNK candidate.  2D echocardiogram showed EF 65-70% with no evidence of valvular disease or atrial level shunt.  Labs negative for HIV, positive for THC, Hgb A1c 5.0 and LDL 104.  He was discharged on Eliquis  (for his prothrombin gene mutation, delayed by 3 days due to vitreous hemorrhage) and atorvastatin  80mg  daily.  He has since followed up with ophthalmology. He is also being evaluated for OSA.  He smokes 1/2 ppd.    PAST MEDICAL HISTORY: Past Medical History:  Diagnosis Date   DVT (deep venous thrombosis) (HCC)    Hypertension    Obesity    Stroke (HCC)     PAST SURGICAL HISTORY: Past Surgical History:  Procedure Laterality Date   NO PAST SURGERIES      MEDICATIONS: Medications Ordered Prior to Encounter[1]  ALLERGIES: Allergies[2]  FAMILY HISTORY: Family History  Problem Relation Age of Onset   Hypertension Other  Diabetes Maternal Grandmother     Objective:  Blood pressure 125/81, pulse (!) 59, height 5' 10 (1.778 m), weight 251 lb 3.2 oz (113.9 kg), SpO2 96%. General: No acute distress.  Patient appears well-groomed.   Head:  Normocephalic/atraumatic Eyes:  fundi examined  but not visualized Neck: supple, no paraspinal tenderness, full range of motion Heart: regular rate and rhythm Neurological Exam: Mental status: alert and oriented to person, place, and time, speech fluent and not dysarthric, language intact. Cranial nerves: CN I: not tested CN II: pupils equal, round and reactive to light, vision loss in right eye CN III, IV, VI:  right eye abducted on primary gaze, full range of motion, no nystagmus, no ptosis CN V: facial sensation intact. CN VII: upper and lower face symmetric CN VIII: hearing intact CN IX, X: gag intact, uvula midline CN XI: sternocleidomastoid and trapezius muscles intact CN XII: tongue midline Bulk & Tone: normal, no fasciculations. Motor:  muscle strength 5/5 throughout Sensation:  Pinprick and vibratory sensation intact. Deep Tendon Reflexes:  2+ throughout,  toes downgoing.   Finger to nose testing:  Without dysmetria.   Gait:  Normal station and stride.  Romberg negative.    Juliene Dunnings, DO          [1]  Current Outpatient Medications on File Prior to Visit  Medication Sig Dispense Refill   albuterol  (VENTOLIN  HFA) 108 (90 Base) MCG/ACT inhaler Inhale 2-4 puffs by mouth every 4 hours as needed for wheezing, cough, and/or shortness of breath 8 g 0   amLODipine (NORVASC) 10 MG tablet Take 10 mg by mouth daily.     apixaban  (ELIQUIS ) 5 MG TABS tablet Take 1 tablet (5 mg total) by mouth 2 (two) times daily. 60 tablet 0   atorvastatin  (LIPITOR) 80 MG tablet Take 1 tablet (80 mg total) by mouth daily. 30 tablet 0   carvedilol  (COREG ) 12.5 MG tablet Take 1 tablet (12.5 mg total) by mouth 2 (two) times daily with a meal. 60 tablet 2   losartan -hydrochlorothiazide  (HYZAAR) 100-25 MG tablet Take 1 tablet by mouth daily.     No current facility-administered medications on file prior to visit.  [2]  Allergies Allergen Reactions   Dust Mite Extract Cough   Pollen Extract Other (See Comments)    Seasonal allergies,  sneezing, runny nose, itchy eyes   "

## 2024-07-20 ENCOUNTER — Ambulatory Visit (INDEPENDENT_AMBULATORY_CARE_PROVIDER_SITE_OTHER): Payer: Self-pay | Admitting: Neurology

## 2024-07-20 VITALS — BP 125/81 | HR 59 | Ht 70.0 in | Wt 251.2 lb

## 2024-07-20 DIAGNOSIS — I63541 Cerebral infarction due to unspecified occlusion or stenosis of right cerebellar artery: Secondary | ICD-10-CM | POA: Diagnosis not present

## 2024-07-20 DIAGNOSIS — E78019 Familial hypercholesterolemia, unspecified: Secondary | ICD-10-CM

## 2024-07-20 DIAGNOSIS — I10 Essential (primary) hypertension: Secondary | ICD-10-CM | POA: Diagnosis not present

## 2024-07-20 DIAGNOSIS — D6852 Prothrombin gene mutation: Secondary | ICD-10-CM

## 2024-07-20 NOTE — Patient Instructions (Addendum)
 Continue Eliquis  Continue atorvastatin  and blood pressure medication Mediterranean diet (see below) Quit smoking Routine exercise Follow up 6 months.   Mediterranean Diet A Mediterranean diet is based on the traditions of countries on the [employer]. It focuses on eating more: Fruits and vegetables. Whole grains, beans, nuts, and seeds. Heart-healthy fats. These are fats that are good for your heart. It involves eating less: Dairy. Meat and eggs. Processed foods with added sugar, salt, and fat. This type of diet can help prevent certain conditions. It can also improve outcomes if you have a long-term (chronic) disease, such as kidney or heart disease. What are tips for following this plan? Reading food labels Check packaged foods for: The serving size. For foods such as rice and pasta, the serving size is the amount of cooked product, not dry. The total fat. Avoid foods with saturated fat or trans fat. Added sugars, such as corn syrup. Shopping  Try to have a balanced diet. Buy a variety of foods, such as: Fresh fruits and vegetables. You may be able to get these from local farmers markets. You can also buy them frozen. Grains, beans, nuts, and seeds. Some of these can be bought in bulk. Fresh seafood. Poultry and eggs. Low-fat dairy products. Buy whole ingredients instead of foods that have already been packaged. If you can't get fresh seafood, buy precooked frozen shrimp or canned fish, such as tuna, salmon, or sardines. Stock your pantry so you always have certain foods on hand, such as olive oil, canned tuna, canned tomatoes, rice, pasta, and beans. Cooking Cook foods with extra-virgin olive oil instead of using butter or other vegetable oils. Have meat as a side dish. Have vegetables or grains as your main dish. This means having meat in small portions or adding small amounts of meat to foods like pasta or stew. Use beans or vegetables instead of meat in common  dishes like chili or lasagna. Try out different cooking methods. Try roasting, broiling, steaming, and sauting vegetables. Add frozen vegetables to soups, stews, pasta, or rice. Add nuts or seeds for added healthy fats and plant protein at each meal. You can add these to yogurt, salads, or vegetable dishes. Marinate fish or vegetables using olive oil, lemon juice, garlic, and fresh herbs. Meal planning Plan to eat a vegetarian meal one day each week. Try to work up to two vegetarian meals, if possible. Eat seafood two or more times a week. Have healthy snacks on hand. These may include: Vegetable sticks with hummus. Greek yogurt. Fruit and nut trail mix. Eat balanced meals. These should include: Fruit: 2-3 servings a day. Vegetables: 4-5 servings a day. Low-fat dairy: 2 servings a day. Fish, poultry, or lean meat: 1 serving a day. Beans and legumes: 2 or more servings a week. Nuts and seeds: 1-2 servings a day. Whole grains: 6-8 servings a day. Extra-virgin olive oil: 3-4 servings a day. Limit red meat and sweets to just a few servings a month. Lifestyle  Try to cook and eat meals with your family. Drink enough fluid to keep your pee (urine) pale yellow. Be active every day. This includes: Aerobic exercise, which is exercise that causes your heart to beat faster. Examples include running and swimming. Leisure activities like gardening, walking, or housework. Get 7-8 hours of sleep each night. Drink red wine if your provider says you can. A glass of wine is 5 oz (150 mL). You may be allowed to have: Up to 1 glass a day if you're  male and not pregnant. Up to 2 glasses a day if you're male. What foods should I eat? Fruits Apples. Apricots. Avocado. Berries. Bananas. Cherries. Dates. Figs. Grapes. Lemons. Melon. Oranges. Peaches. Plums. Pomegranate. Vegetables Artichokes. Beets. Broccoli. Cabbage. Carrots. Eggplant. Green beans. Chard. Kale. Spinach. Onions. Leeks. Peas. Squash.  Tomatoes. Peppers. Radishes. Grains Whole-grain pasta. Brown rice. Bulgur wheat. Polenta. Couscous. Whole-wheat bread. [place] Madeira. Meats and other proteins Beans. Almonds. Sunflower seeds. Pine nuts. Peanuts. Cod. Salmon. Scallops. Shrimp. Tuna. Tilapia. Clams. Oysters. Eggs. Chicken or turkey without skin. Dairy Low-fat milk. Cheese. Greek yogurt. Fats and oils Extra-virgin olive oil. Avocado oil. Grapeseed oil. Beverages Water. Red wine. Herbal tea. Sweets and desserts Greek yogurt with honey. Baked apples. Poached pears. Trail mix. Seasonings and condiments Basil. Cilantro. Coriander. Cumin. Mint. Parsley. Sage. Rosemary. Tarragon. Garlic. Oregano. Thyme. Pepper. Balsamic vinegar. Tahini. Hummus. Tomato sauce. Olives. Mushrooms. The items listed above may not be all the foods and drinks you can have. Talk to a dietitian to learn more. What foods should I limit? This is a list of foods that should be eaten rarely. Fruits Fruit canned in syrup. Vegetables Deep-fried potatoes, like French fries. Grains Packaged pasta or rice dishes. Cereal with added sugar. Snacks with added sugar. Meats and other proteins Beef. Pork. Lamb. Chicken or turkey with skin. Hot dogs. [place]. Dairy Ice cream. Sour cream. Whole milk. Fats and oils Butter. Canola oil. Vegetable oil. Beef fat (tallow). Lard. Beverages Juice. Sugar-sweetened soft drinks. Beer. Liquor and spirits. Sweets and desserts Cookies. Cakes. Pies. Candy. Seasonings and condiments Mayonnaise. Pre-made sauces and marinades. The items listed above may not be all the foods and drinks you should limit. Talk to a dietitian to learn more. Where to find more information American Heart Association (AHA): heart.org This information is not intended to replace advice given to you by your health care provider. Make sure you discuss any questions you have with your health care provider. Document Revised: 10/12/2022 Document Reviewed:  10/12/2022 Elsevier Patient Education  2024 [employer].

## 2024-07-21 ENCOUNTER — Encounter: Payer: Self-pay | Admitting: Neurology

## 2025-01-30 ENCOUNTER — Ambulatory Visit: Payer: Self-pay | Admitting: Neurology
